# Patient Record
Sex: Female | Born: 1955 | Race: White | Hispanic: No | Marital: Married | Smoking: Former smoker
Health system: Southern US, Community
[De-identification: ages and names within clinical notes are randomized; demographics above are authoritative.]

## PROBLEM LIST (undated history)

## (undated) DIAGNOSIS — C6932 Malignant neoplasm of left choroid: Secondary | ICD-10-CM

## (undated) DIAGNOSIS — K219 Gastro-esophageal reflux disease without esophagitis: Secondary | ICD-10-CM

## (undated) DIAGNOSIS — F419 Anxiety disorder, unspecified: Secondary | ICD-10-CM

## (undated) DIAGNOSIS — L409 Psoriasis, unspecified: Secondary | ICD-10-CM

## (undated) DIAGNOSIS — Z973 Presence of spectacles and contact lenses: Secondary | ICD-10-CM

## (undated) DIAGNOSIS — C801 Malignant (primary) neoplasm, unspecified: Secondary | ICD-10-CM

## (undated) HISTORY — PX: ESOPHAGOGASTRODUODENOSCOPY: SHX1529

## (undated) HISTORY — PX: COLONOSCOPY: SHX174

## (undated) HISTORY — PX: ABDOMINAL HYSTERECTOMY: SHX81

---

## 2004-09-21 ENCOUNTER — Ambulatory Visit: Payer: Self-pay | Admitting: Orthopedic Surgery

## 2005-08-05 HISTORY — PX: BRAIN TUMOR EXCISION: SHX577

## 2005-11-26 ENCOUNTER — Ambulatory Visit: Payer: Self-pay

## 2006-02-06 ENCOUNTER — Ambulatory Visit: Payer: Self-pay | Admitting: Neurosurgery

## 2006-03-14 ENCOUNTER — Ambulatory Visit: Payer: Self-pay | Admitting: Internal Medicine

## 2006-10-03 ENCOUNTER — Ambulatory Visit: Payer: Self-pay | Admitting: Internal Medicine

## 2007-07-23 ENCOUNTER — Ambulatory Visit: Payer: Self-pay | Admitting: Rheumatology

## 2007-10-20 ENCOUNTER — Ambulatory Visit: Payer: Self-pay | Admitting: Unknown Physician Specialty

## 2007-10-22 ENCOUNTER — Ambulatory Visit: Payer: Self-pay | Admitting: Unknown Physician Specialty

## 2007-12-04 ENCOUNTER — Ambulatory Visit: Payer: Self-pay | Admitting: Internal Medicine

## 2007-12-22 ENCOUNTER — Ambulatory Visit: Payer: Self-pay | Admitting: Unknown Physician Specialty

## 2008-04-07 ENCOUNTER — Ambulatory Visit: Payer: Self-pay

## 2008-07-26 ENCOUNTER — Ambulatory Visit: Payer: Self-pay | Admitting: Internal Medicine

## 2010-08-05 HISTORY — PX: EYE SURGERY: SHX253

## 2010-12-10 ENCOUNTER — Ambulatory Visit: Payer: Self-pay | Admitting: Internal Medicine

## 2013-08-11 ENCOUNTER — Ambulatory Visit: Payer: Self-pay | Admitting: Internal Medicine

## 2013-09-24 ENCOUNTER — Ambulatory Visit: Payer: Self-pay | Admitting: Hematology and Oncology

## 2013-09-29 ENCOUNTER — Ambulatory Visit: Payer: Self-pay | Admitting: Hematology and Oncology

## 2013-10-13 ENCOUNTER — Ambulatory Visit: Payer: Self-pay

## 2013-12-08 ENCOUNTER — Ambulatory Visit: Payer: Self-pay | Admitting: Hematology and Oncology

## 2014-03-16 ENCOUNTER — Ambulatory Visit: Payer: Self-pay | Admitting: Family Medicine

## 2014-03-16 LAB — COMPREHENSIVE METABOLIC PANEL
ALK PHOS: 88 U/L
ALT: 31 U/L
ANION GAP: 8 (ref 7–16)
AST: 17 U/L (ref 15–37)
Albumin: 3.8 g/dL (ref 3.4–5.0)
BILIRUBIN TOTAL: 0.3 mg/dL (ref 0.2–1.0)
BUN: 12 mg/dL (ref 7–18)
CALCIUM: 9 mg/dL (ref 8.5–10.1)
CHLORIDE: 100 mmol/L (ref 98–107)
CREATININE: 0.85 mg/dL (ref 0.60–1.30)
Co2: 28 mmol/L (ref 21–32)
EGFR (African American): >60
GLUCOSE: 87 mg/dL (ref 65–99)
Osmolality: 271 (ref 275–301)
POTASSIUM: 3.8 mmol/L (ref 3.5–5.1)
SODIUM: 136 mmol/L (ref 136–145)
TOTAL PROTEIN: 7.2 g/dL (ref 6.4–8.2)

## 2014-03-16 LAB — CBC CANCER CENTER
BASOS ABS: 0.1 x10 3/mm (ref 0.0–0.1)
Basophil %: 1.1 %
EOS ABS: 0.1 x10 3/mm (ref 0.0–0.7)
EOS PCT: 1.7 %
HCT: 43.8 % (ref 35.0–47.0)
HGB: 14.6 g/dL (ref 12.0–16.0)
LYMPHS ABS: 2.3 x10 3/mm (ref 1.0–3.6)
Lymphocyte %: 34 %
MCH: 30.9 pg (ref 26.0–34.0)
MCHC: 33.4 g/dL (ref 32.0–36.0)
MCV: 92 fL (ref 80–100)
MONOS PCT: 9.3 %
Monocyte #: 0.6 x10 3/mm (ref 0.2–0.9)
Neutrophil #: 3.7 x10 3/mm (ref 1.4–6.5)
Neutrophil %: 53.9 %
PLATELETS: 239 x10 3/mm (ref 150–440)
RBC: 4.74 10*6/uL (ref 3.80–5.20)
RDW: 13.8 % (ref 11.5–14.5)
WBC: 6.8 x10 3/mm (ref 3.6–11.0)

## 2014-04-05 ENCOUNTER — Ambulatory Visit: Payer: Self-pay | Admitting: Family Medicine

## 2015-11-30 ENCOUNTER — Other Ambulatory Visit: Payer: Self-pay | Admitting: Internal Medicine

## 2015-11-30 DIAGNOSIS — M5440 Lumbago with sciatica, unspecified side: Secondary | ICD-10-CM

## 2015-12-18 ENCOUNTER — Ambulatory Visit: Payer: 59 | Attending: Internal Medicine

## 2016-08-05 HISTORY — PX: MOHS SURGERY: SUR867

## 2016-08-08 ENCOUNTER — Other Ambulatory Visit: Payer: Self-pay | Admitting: Orthopedic Surgery

## 2016-08-08 DIAGNOSIS — M2392 Unspecified internal derangement of left knee: Secondary | ICD-10-CM

## 2016-08-08 DIAGNOSIS — M1712 Unilateral primary osteoarthritis, left knee: Secondary | ICD-10-CM

## 2016-08-16 ENCOUNTER — Ambulatory Visit: Payer: 59

## 2017-07-24 ENCOUNTER — Emergency Department
Admission: EM | Admit: 2017-07-24 | Discharge: 2017-07-25 | Disposition: A | Discharge status: Home / Self Care | Payer: 59 | Attending: Emergency Medicine | Admitting: Emergency Medicine

## 2017-07-24 ENCOUNTER — Other Ambulatory Visit: Payer: Self-pay

## 2017-07-24 DIAGNOSIS — R197 Diarrhea, unspecified: Secondary | ICD-10-CM | POA: Insufficient documentation

## 2017-07-24 DIAGNOSIS — R112 Nausea with vomiting, unspecified: Secondary | ICD-10-CM | POA: Insufficient documentation

## 2017-07-24 LAB — COMPREHENSIVE METABOLIC PANEL
ALBUMIN: 3.3 g/dL — AB (ref 3.5–5.0)
ALT: 21 U/L (ref 14–54)
AST: 25 U/L (ref 15–41)
Alkaline Phosphatase: 59 U/L (ref 38–126)
Anion gap: 9 (ref 5–15)
BUN: 16 mg/dL (ref 6–20)
CHLORIDE: 101 mmol/L (ref 101–111)
CO2: 20 mmol/L — ABNORMAL LOW (ref 22–32)
Calcium: 8.2 mg/dL — ABNORMAL LOW (ref 8.9–10.3)
Creatinine, Ser: 0.62 mg/dL (ref 0.44–1.00)
GFR calc Af Amer: >60 mL/min (ref >60)
Glucose, Bld: 135 mg/dL — ABNORMAL HIGH (ref 65–99)
POTASSIUM: 3.6 mmol/L (ref 3.5–5.1)
Sodium: 130 mmol/L — ABNORMAL LOW (ref 135–145)
Total Bilirubin: 0.6 mg/dL (ref 0.3–1.2)
Total Protein: 6.2 g/dL — ABNORMAL LOW (ref 6.5–8.1)

## 2017-07-24 LAB — CBC
HEMATOCRIT: 41.8 % (ref 35.0–47.0)
Hemoglobin: 14.2 g/dL (ref 12.0–16.0)
MCH: 31 pg (ref 26.0–34.0)
MCHC: 33.9 g/dL (ref 32.0–36.0)
MCV: 91.4 fL (ref 80.0–100.0)
Platelets: 187 10*3/uL (ref 150–440)
RBC: 4.57 MIL/uL (ref 3.80–5.20)
RDW: 13.4 % (ref 11.5–14.5)
WBC: 7 10*3/uL (ref 3.6–11.0)

## 2017-07-24 LAB — LIPASE, BLOOD: LIPASE: 21 U/L (ref 11–51)

## 2017-07-24 MED ORDER — METOCLOPRAMIDE HCL 5 MG/ML IJ SOLN
10.0000 mg | Freq: Once | INTRAMUSCULAR | Status: AC
  Administered 2017-07-24: 10 mg via INTRAVENOUS
  Filled 2017-07-24: qty 2

## 2017-07-24 MED ORDER — SODIUM CHLORIDE 0.9 % IV BOLUS (SEPSIS)
1000.0000 mL | Freq: Once | INTRAVENOUS | Status: AC
  Administered 2017-07-24: 1000 mL via INTRAVENOUS

## 2017-07-24 MED ORDER — MORPHINE SULFATE (PF) 2 MG/ML IV SOLN
2.0000 mg | Freq: Once | INTRAVENOUS | Status: AC
  Administered 2017-07-24: 2 mg via INTRAVENOUS
  Filled 2017-07-24: qty 1

## 2017-07-24 MED ORDER — FAMOTIDINE IN NACL 20-0.9 MG/50ML-% IV SOLN
20.0000 mg | Freq: Once | INTRAVENOUS | Status: AC
  Administered 2017-07-24: 20 mg via INTRAVENOUS
  Filled 2017-07-24: qty 50

## 2017-07-24 MED ORDER — ONDANSETRON HCL 4 MG PO TABS: 4.0000 mg | ORAL_TABLET | Freq: Three times a day (TID) | ORAL | 0 refills | Status: DC | PRN

## 2017-07-24 MED ORDER — PB-HYOSCY-ATROPINE-SCOPOLAMINE 16.2 MG/5ML PO ELIX
10.0000 mL | ORAL_SOLUTION | Freq: Once | ORAL | Status: DC
  Filled 2017-07-24: qty 10

## 2017-07-24 NOTE — ED Provider Notes (Addendum)
[facility] Emergency Department Provider Note ____________________________________________   First MD Initiated Contact with Patient 07/24/17 2211     (approximate)  I have reviewed the triage vital signs and the nursing notes.   HISTORY  Chief Complaint Nausea and Emesis    HPI Cindy Morrison is a 61 y.o. female with past medical history as noted below who presents with nausea and vomiting, acute onset approximately 20 hours ago, persistent course, with most recent episode of vomiting just prior to coming to the emergency department.  Patient reports associated diarrhea.  She states that there is no blood in either the vomitus or the diarrhea.  She reports crampy upper abdominal pain.  Patient denies fever or chills.  Her husband had the same symptoms although his have improved in the last few hours.  The patient and her husband both had tacos from the same restaurant last night approximately 6 hours before the symptoms started.  No other recent illness, sick contacts, unusual foods, or any recent travel.  No recent antibiotic use.  No past medical history on file.  There are no active problems to display for this patient.    Prior to Admission medications   Medication Sig Start Date End Date Taking? Authorizing Provider  ondansetron (ZOFRAN) 4 MG tablet Take 1 tablet (4 mg total) by mouth every 8 (eight) hours as needed for up to 5 days for nausea or vomiting. 07/25/17 07/30/17  Hinda Kehr, MD    Allergies Patient has no allergy information on record.  No family history on file.  Social History Social History   Tobacco Use  . Smoking status: Not on file  Substance Use Topics  . Alcohol use: Not on file  . Drug use: Not on file    Review of Systems  Constitutional: No fever.  Eyes: No redness. ENT: No sore throat. Cardiovascular: Denies chest pain. Respiratory: Denies shortness of breath. Gastrointestinal: Positive for nausea,  vomiting, diarrhea.  Genitourinary: Negative for dysuria.  Musculoskeletal: Negative for back pain. Skin: Negative for rash. Neurological: Negative for headache.    ____________________________________________   PHYSICAL EXAM:  VITAL SIGNS: ED Triage Vitals  Enc Vitals Group     BP 07/24/17 2150 (!) 118/57     Pulse Rate 07/24/17 2150 (!) 101     Resp 07/24/17 2150 (!) 21     Temp 07/24/17 2150 98.5 F (36.9 C)     Temp Source 07/24/17 2150 Oral     SpO2 07/24/17 2150 90 %     Weight 07/24/17 2154 160 lb (72.6 kg)     Height 07/24/17 2154 5\' 6"  (1.676 m)     Head Circumference --      Peak Flow --      Pain Score 07/24/17 2150 10     Pain Loc --      Pain Edu? --      Excl. in [place]? --     Constitutional: Alert and oriented.  Slightly uncomfortable appearing but in no acute distress. Eyes: Conjunctivae are normal.  No scleral icterus Head: Atraumatic. Nose: No congestion/rhinnorhea. Mouth/Throat: Mucous membranes are dry.   Neck: Normal range of motion.  Cardiovascular: Normal rate, regular rhythm. Grossly normal heart sounds.  Good peripheral circulation. Respiratory: Normal respiratory effort.  No retractions. Lungs CTAB. Gastrointestinal: Soft with mild epigastric tenderness, and mild discomfort to bilateral lower quadrants. No distention.  Genitourinary: No flank tenderness. Musculoskeletal: No lower extremity edema.  Extremities warm and well perfused.  Neurologic:  Normal speech and language. No gross focal neurologic deficits are appreciated.  Skin:  Skin is warm and dry. No rash noted. Psychiatric: Mood and affect are normal. Speech and behavior are normal.  ____________________________________________   LABS (all labs ordered are listed, but only abnormal results are displayed)  Labs Reviewed  COMPREHENSIVE METABOLIC PANEL - Abnormal; Notable for the following components:      Result Value   Sodium 130 (*)    CO2 20 (*)    Glucose, Bld 135 (*)     Calcium 8.2 (*)    Total Protein 6.2 (*)    Albumin 3.3 (*)    All other components within normal limits  URINALYSIS, COMPLETE (UACMP) WITH MICROSCOPIC - Abnormal; Notable for the following components:   Color, Urine STRAW (*)    APPearance CLEAR (*)    Hgb urine dipstick SMALL (*)    Squamous Epithelial / LPF 0-5 (*)    All other components within normal limits  LIPASE, BLOOD  CBC   ____________________________________________  EKG  ED ECG REPORT I, Arta Silence, the attending physician, personally viewed and interpreted this ECG.  Date: 07/24/2017 EKG Time: 2152 Rate: 99 Rhythm: normal sinus rhythm QRS Axis: normal Intervals: normal ST/T Wave abnormalities: normal Narrative Interpretation: no evidence of acute ischemia; no prior EKG available for comparison  ____________________________________________  RADIOLOGY    ____________________________________________   PROCEDURES  Procedure(s) performed: No    Critical Care performed: No ____________________________________________   INITIAL IMPRESSION / ASSESSMENT AND PLAN / ED COURSE  Pertinent labs & imaging results that were available during my care of the patient were reviewed by me and considered in my medical decision making (see chart for details).  61 year old female with past medical history as noted above presents with nausea, vomiting, diarrhea over the last day, with crampy abdominal pain.  Symptoms began after she ate tacos from a [employer], and her husband, who ate the same food, had identical symptoms until the last few hours.  No relevant records are available on review of past medical records in [place].  On exam, patient is relatively well-appearing, vital signs are normal except for borderline tachycardia, the abdomen is soft with minimal bilateral lower quadrant discomfort and mild upper abdominal tenderness, and the mucous members are somewhat dry.  Presentation is consistent with  acute gastroenteritis versus foodborne illness.  There is no evidence of hepatobiliary cause, colitis, diverticulitis, or other intra-abdominal cause given the lack of focal tenderness, as well as the fact that the husband had the same symptoms and they were associated with food.  Plan for symptomatic treatment, IV fluids, labs and reassess.  Clinical Course as of Jul 25 2237  Thu Jul 24, 2017  2338 Assuming care from Dr. Cherylann Banas.  In short, Cindy Morrison is a 61 y.o. female with a chief complaint of N/V and abdominal pain.  Refer to the original H&P for additional details.  The current plan of care is to reassess and discharge when/if appropriate.   [CF]  Fri Jul 25, 2017  0024 Reassessed the patient who is sleeping comfortably.  She woke up and smiled and said that she feels great.  She has finished her second liter and would like to go home and get some rest.  I reviewed her labs which are all reassuring.  Her urinalysis is still pending, but as long as the results are normal she will be discharged with Dr. Kennith Gain return precautions and recommendations.  [CF]  [phone]  No evidence of infection on UA.  Will discharge as planned. WBC, UA: 0-5 [CF]    Clinical Course User Index [CF] Hinda Kehr, MD   ----------------------------------------- 11:26 PM on 07/24/2017 -----------------------------------------  The patient reported persistent pain after Pepcid and Donnatal is not available, so I ordered a low dose of morphine.  We will give a second liter of fluids.  Patient's labs are unremarkable.  I am signing the patient out to the oncoming physician Dr. Karma Greaser, who will reassess the patient and discharge home if symptoms improve and she is tolerating p.o.  ____________________________________________   FINAL CLINICAL IMPRESSION(S) / ED DIAGNOSES  Final diagnoses:  Nausea vomiting and diarrhea      NEW MEDICATIONS STARTED DURING THIS VISIT:  This SmartLink is deprecated. Use  AVSMEDLIST instead to display the medication list for a patient.   Note:  This document was prepared using Dragon voice recognition software and may include unintentional dictation errors.    Arta Silence, MD 07/24/17 4098    Arta Silence, MD 07/24/17 Arnoldo Lenis    Arta Silence, MD 07/25/17 2238

## 2017-07-24 NOTE — ED Provider Notes (Signed)
No results found.   Labs Reviewed  COMPREHENSIVE METABOLIC PANEL - Abnormal; Notable for the following components:      Result Value   Sodium 130 (*)    CO2 20 (*)    Glucose, Bld 135 (*)    Calcium 8.2 (*)    Total Protein 6.2 (*)    Albumin 3.3 (*)    All other components within normal limits  URINALYSIS, COMPLETE (UACMP) WITH MICROSCOPIC - Abnormal; Notable for the following components:   Color, Urine STRAW (*)    APPearance CLEAR (*)    Hgb urine dipstick SMALL (*)    Squamous Epithelial / LPF 0-5 (*)    All other components within normal limits  LIPASE, BLOOD  CBC     Clinical Course as of Jul 26 51  Thu Jul 24, 2017  2338 Assuming care from Dr. Cherylann Banas.  In short, Cindy Morrison is a 61 y.o. female with a chief complaint of N/V and abdominal pain.  Refer to the original H&P for additional details.  The current plan of care is to reassess and discharge when/if appropriate.   [CF]  Fri Jul 25, 2017  0024 Reassessed the patient who is sleeping comfortably.  She woke up and smiled and said that she feels great.  She has finished her second liter and would like to go home and get some rest.  I reviewed her labs which are all reassuring.  Her urinalysis is still pending, but as long as the results are normal she will be discharged with Dr. Kennith Gain return precautions and recommendations.  [CF]  0051 No evidence of infection on UA.  Will discharge as planned. WBC, UA: 0-5 [CF]    Clinical Course User Index [CF] Hinda Kehr, MD     Final diagnoses:  Nausea vomiting and diarrhea      Hinda Kehr, MD 07/25/17 [phone]

## 2017-07-24 NOTE — Discharge Instructions (Addendum)
Return to the emergency department for new, worsening, persistent vomiting, abdominal pain, fever or chills, inability to tolerate anything by mouth, blood in your stool or in your vomit, or any other new or worsening symptoms that concern you.

## 2017-07-24 NOTE — ED Triage Notes (Signed)
Pt arrived via EMS from home d/t nausea and vomiting since 3am and severe diarrhea. this morning. Pt reports her and her husband ate tacos last night and both became sick around 3am. Pt reports her husband is feeling better but her symptoms have not stopped. Pt reports having around 10 episodes of vomiting within the last 24 hours. Pt is A&O x4 at this time.

## 2017-07-25 LAB — URINALYSIS, COMPLETE (UACMP) WITH MICROSCOPIC
Bacteria, UA: NONE SEEN
Bilirubin Urine: NEGATIVE
Glucose, UA: NEGATIVE mg/dL
KETONES UR: NEGATIVE mg/dL
LEUKOCYTES UA: NEGATIVE
Nitrite: NEGATIVE
PROTEIN: NEGATIVE mg/dL
Specific Gravity, Urine: 1.008 (ref 1.005–1.030)
pH: 6 (ref 5.0–8.0)

## 2017-07-25 MED ORDER — ONDANSETRON HCL 4 MG PO TABS: 4.0000 mg | ORAL_TABLET | Freq: Three times a day (TID) | ORAL | 0 refills | Status: AC | PRN

## 2017-09-01 ENCOUNTER — Emergency Department
Admission: EM | Admit: 2017-09-01 | Discharge: 2017-09-02 | Disposition: A | Discharge status: Home / Self Care | Payer: Managed Care, Other (non HMO) | Attending: Emergency Medicine | Admitting: Emergency Medicine

## 2017-09-01 ENCOUNTER — Emergency Department: Payer: Managed Care, Other (non HMO)

## 2017-09-01 DIAGNOSIS — F17200 Nicotine dependence, unspecified, uncomplicated: Secondary | ICD-10-CM | POA: Insufficient documentation

## 2017-09-01 DIAGNOSIS — R197 Diarrhea, unspecified: Secondary | ICD-10-CM | POA: Diagnosis not present

## 2017-09-01 DIAGNOSIS — K529 Noninfective gastroenteritis and colitis, unspecified: Secondary | ICD-10-CM | POA: Insufficient documentation

## 2017-09-01 DIAGNOSIS — E860 Dehydration: Secondary | ICD-10-CM

## 2017-09-01 DIAGNOSIS — R1030 Lower abdominal pain, unspecified: Secondary | ICD-10-CM | POA: Diagnosis not present

## 2017-09-01 DIAGNOSIS — Z7982 Long term (current) use of aspirin: Secondary | ICD-10-CM | POA: Insufficient documentation

## 2017-09-01 DIAGNOSIS — Z85840 Personal history of malignant neoplasm of eye: Secondary | ICD-10-CM | POA: Diagnosis not present

## 2017-09-01 DIAGNOSIS — Z79899 Other long term (current) drug therapy: Secondary | ICD-10-CM | POA: Diagnosis not present

## 2017-09-01 DIAGNOSIS — R112 Nausea with vomiting, unspecified: Secondary | ICD-10-CM | POA: Insufficient documentation

## 2017-09-01 DIAGNOSIS — R000 Tachycardia, unspecified: Secondary | ICD-10-CM | POA: Insufficient documentation

## 2017-09-01 HISTORY — DX: Malignant (primary) neoplasm, unspecified: C80.1

## 2017-09-01 LAB — CBC
HCT: 41.8 % (ref 35.0–47.0)
Hemoglobin: 14.6 g/dL (ref 12.0–16.0)
MCH: 31.7 pg (ref 26.0–34.0)
MCHC: 34.9 g/dL (ref 32.0–36.0)
MCV: 90.7 fL (ref 80.0–100.0)
PLATELETS: 198 10*3/uL (ref 150–440)
RBC: 4.61 MIL/uL (ref 3.80–5.20)
RDW: 13.8 % (ref 11.5–14.5)
WBC: 8.1 10*3/uL (ref 3.6–11.0)

## 2017-09-01 LAB — COMPREHENSIVE METABOLIC PANEL
ALK PHOS: 65 U/L (ref 38–126)
ALT: 24 U/L (ref 14–54)
AST: 29 U/L (ref 15–41)
Albumin: 3.9 g/dL (ref 3.5–5.0)
Anion gap: 10 (ref 5–15)
BILIRUBIN TOTAL: 0.7 mg/dL (ref 0.3–1.2)
BUN: 14 mg/dL (ref 6–20)
CALCIUM: 8.9 mg/dL (ref 8.9–10.3)
CO2: 21 mmol/L — AB (ref 22–32)
CREATININE: 0.67 mg/dL (ref 0.44–1.00)
Chloride: 102 mmol/L (ref 101–111)
Glucose, Bld: 158 mg/dL — ABNORMAL HIGH (ref 65–99)
Potassium: 3.7 mmol/L (ref 3.5–5.1)
Sodium: 133 mmol/L — ABNORMAL LOW (ref 135–145)
Total Protein: 7.2 g/dL (ref 6.5–8.1)

## 2017-09-01 LAB — URINALYSIS, COMPLETE (UACMP) WITH MICROSCOPIC
Bacteria, UA: NONE SEEN
Bilirubin Urine: NEGATIVE
GLUCOSE, UA: NEGATIVE mg/dL
Hgb urine dipstick: NEGATIVE
KETONES UR: 5 mg/dL — AB
Leukocytes, UA: NEGATIVE
Nitrite: NEGATIVE
PH: 6 (ref 5.0–8.0)
Protein, ur: NEGATIVE mg/dL
Specific Gravity, Urine: 1.017 (ref 1.005–1.030)

## 2017-09-01 LAB — LIPASE, BLOOD: Lipase: 17 U/L (ref 11–51)

## 2017-09-01 MED ORDER — SODIUM CHLORIDE 0.9 % IV BOLUS (SEPSIS)
1000.0000 mL | Freq: Once | INTRAVENOUS | Status: AC
  Administered 2017-09-01: 1000 mL via INTRAVENOUS

## 2017-09-01 MED ORDER — ONDANSETRON HCL 4 MG/2ML IJ SOLN
4.0000 mg | Freq: Once | INTRAMUSCULAR | Status: AC
  Administered 2017-09-01: 4 mg via INTRAVENOUS

## 2017-09-01 MED ORDER — ONDANSETRON HCL 4 MG/2ML IJ SOLN
INTRAMUSCULAR | Status: AC
  Filled 2017-09-01: qty 2

## 2017-09-01 MED ORDER — IOPAMIDOL (ISOVUE-300) INJECTION 61%
100.0000 mL | Freq: Once | INTRAVENOUS | Status: AC | PRN
  Administered 2017-09-01: 100 mL via INTRAVENOUS
  Filled 2017-09-01: qty 100

## 2017-09-01 MED ORDER — DEXTROSE 5 % IV BOLUS
500.0000 mL | Freq: Once | INTRAVENOUS | Status: AC
  Administered 2017-09-02: 500 mL via INTRAVENOUS
  Filled 2017-09-01: qty 500

## 2017-09-01 MED ORDER — PROMETHAZINE HCL 25 MG/ML IJ SOLN
25.0000 mg | Freq: Once | INTRAMUSCULAR | Status: AC
  Administered 2017-09-02: 25 mg via INTRAVENOUS
  Filled 2017-09-01: qty 1

## 2017-09-01 MED ORDER — ONDANSETRON 4 MG PO TBDP
ORAL_TABLET | ORAL | Status: AC
  Filled 2017-09-01: qty 1

## 2017-09-01 MED ORDER — ONDANSETRON 4 MG PO TBDP
4.0000 mg | ORAL_TABLET | Freq: Once | ORAL | Status: AC | PRN
  Administered 2017-09-01: 4 mg via ORAL

## 2017-09-01 NOTE — ED Provider Notes (Signed)
[facility] Emergency Department Provider Note  ____________________________________________  Time seen: Approximately 10:35 PM  I have reviewed the triage vital signs and the nursing notes.   HISTORY  Chief Complaint Emesis and Diarrhea    HPI Cindy Morrison is a 62 y.o. female who complains of diffuse lower abdominal pain with vomiting and diarrhea that started last night. Constant, waxing and waning, nonradiating, no aggravating or alleviating factors. Rated as severe. She is able to tolerate water but notes that it precipitates more diarrhea. Not able to tolerate solid foods at this time.     Past Medical History:  Diagnosis Date  . 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    Past Surgical History:  Procedure Laterality Date  . ABDOMINAL HYSTERECTOMY    . BRAIN TUMOR EXCISION    . EYE SURGERY       Prior to Admission medications   Medication Sig Start Date End Date Taking? Authorizing Provider  famotidine (PEPCID) 20 MG tablet Take 1 tablet (20 mg total) by mouth 2 (two) times daily. 09/02/17   Carrie Mew, MD  naproxen (NAPROSYN) 500 MG tablet Take 1 tablet (500 mg total) by mouth 2 (two) times daily with a meal. 09/02/17   Carrie Mew, MD  promethazine (PHENERGAN) 25 MG tablet Take 1 tablet (25 mg total) by mouth every 6 (six) hours as needed for nausea or vomiting. 09/02/17   Carrie Mew, MD   cholecalciferol (CHOLECALCIFEROL) 1,000 unit tablet  Take 1,000 Units by mouth once daily.  0   Active  cyanocobalamin (VITAMIN B12) 1000 MCG tablet  Take 1,000 mcg by mouth once daily.  0   Active  omega-3 fatty acids/fish oil 340-1,000 mg capsule  Take 1 capsule by mouth 2 (two) times daily.  0   Active  betamethasone dipropionate (DIPROSONE) 0.05 % ointment  Apply topically 2 (two) times daily. 45 g  5 12/02/2016  Active  turmeric root extract 500 mg Cap  Take 1 tablet by  mouth once daily.  0   Active  calcium carbonate 1250 MG capsule  Take 1,250 mg by mouth 2 (two) times daily with meals.  0   Active  etodolac (LODINE) 400 MG tablet  TAKE 1 TABLET BY MOUTH TWO TIMES DAILY 180 tablet  3 04/21/2017  Active  LORazepam (ATIVAN) 0.5 MG tablet  Indications: Situational depression Take 1 tablet (0.5 mg total) by mouth every 8 (eight) hours as needed for Anxiety 90 tablet  0 06/03/2017  Active  aspirin 81 MG EC tablet  Take 81 mg by mouth once daily  0   Active  cinnamon bark 500 mg capsule  Take 1,000 mg by mouth once daily  0   Active  omeprazole (PRILOSEC) 40 MG DR capsule  TAKE 1 CAPSULE BY MOUTH ONCE A DAY 90 capsule  0 07/08/2017  Active  venlafaxine (EFFEXOR-XR) 150 MG XR capsule  TAKE 1 CAPSULE BY MOUTH ONCE DAILY 90 capsule  0 07/31/2017  Active  zolpidem (AMBIEN) 5 MG tablet  Take 1 tablet (5 mg total) by mouth nightly as needed for Sleep 30 tablet  3 08/06/2017  Active      Allergies Patient has no known allergies.   No family history on file.  Social History Social History   Tobacco Use  . Smoking status: Current Every Day Smoker  . Smokeless tobacco: Never Used  Substance Use Topics  .  Alcohol use: No    Frequency: Never  . Drug use: Not on file    Review of Systems  Constitutional:   No fever or chills.  ENT:   Positive recent rhinorrhea. No sore throat or cough. Cardiovascular:   No chest pain or syncope. Respiratory:   No dyspnea or cough. Gastrointestinal:  Positive for generalized abdominal pain as above with vomiting and diarrhea.  Musculoskeletal:   Negative for focal pain or swelling. Positive body aches. No joint pains. All other systems reviewed and are negative except as documented above in ROS and HPI.  ____________________________________________   PHYSICAL EXAM:  VITAL SIGNS: ED Triage Vitals  Enc Vitals Group     BP 09/01/17 1955 (!) 118/58     Pulse Rate 09/01/17 1955 (!) 104      Resp 09/01/17 1955 (!) 22     Temp 09/01/17 1955 98.8 F (37.1 C)     Temp Source 09/01/17 1955 Oral     SpO2 09/01/17 1955 97 %     Weight 09/01/17 1956 163 lb (73.9 kg)     Height 09/01/17 1956 5\' 6"  (1.676 m)     Head Circumference --      Peak Flow --      Pain Score 09/01/17 2000 8     Pain Loc --      Pain Edu? --      Excl. in [place]? --     Vital signs reviewed, nursing assessments reviewed.   Constitutional:   Alert and oriented. Uncomfortable, but not in distress. Eyes:   No scleral icterus.  EOMI. No nystagmus. No conjunctival pallor. PERRL. ENT   Head:   Normocephalic and atraumatic.   Nose:   No congestion/rhinnorhea.    Mouth/Throat:   MMM, no pharyngeal erythema. No peritonsillar mass.    Neck:   No meningismus. Full ROM. Hematological/Lymphatic/Immunilogical:   No cervical lymphadenopathy. Cardiovascular:   Tachycardia heart rate 105. Symmetric bilateral radial and DP pulses.  No murmurs.  Respiratory:   Normal respiratory effort without tachypnea/retractions. Breath sounds are clear and equal bilaterally. No wheezes/rales/rhonchi. Gastrointestinal:   Soft with generalized tenderness, nonfocal. Non distended. There is no CVA tenderness.  No rebound, rigidity, or guarding. Genitourinary:   deferred Musculoskeletal:   Normal range of motion in all extremities. No joint effusions.  No lower extremity tenderness.  No edema. Neurologic:   Normal speech and language.  Motor grossly intact. No acute focal neurologic deficits are appreciated.  Skin:    Skin is warm, dry and intact. No rash noted.  No petechiae, purpura, or bullae.  ____________________________________________    LABS (pertinent positives/negatives) (all labs ordered are listed, but only abnormal results are displayed) Labs Reviewed  COMPREHENSIVE METABOLIC PANEL - Abnormal; Notable for the following components:      Result Value   Sodium 133 (*)    CO2 21 (*)    Glucose, Bld 158 (*)     All other components within normal limits  URINALYSIS, COMPLETE (UACMP) WITH MICROSCOPIC - Abnormal; Notable for the following components:   Color, Urine YELLOW (*)    APPearance HAZY (*)    Ketones, ur 5 (*)    Squamous Epithelial / LPF 0-5 (*)    All other components within normal limits  LIPASE, BLOOD  CBC   ____________________________________________   EKG    ____________________________________________    RADIOLOGY  Ct Abdomen Pelvis W Contrast  Result Date: 09/01/2017 CLINICAL DATA:  Lower abdominal pain, vomiting EXAM: CT  ABDOMEN AND PELVIS WITH CONTRAST TECHNIQUE: Multidetector CT imaging of the abdomen and pelvis was performed using the standard protocol following bolus administration of intravenous contrast. CONTRAST:  15mL ISOVUE-300 IOPAMIDOL (ISOVUE-300) INJECTION 61% COMPARISON:  08/11/2013 CT.  MRI 12/08/2013. FINDINGS: Lower chest: Bibasilar scarring or atelectasis. Heart is normal size. No effusions. Hepatobiliary: Hemangiomas again seen within the liver as shown on prior MRI. The gallbladder unremarkable. Pancreas: No focal abnormality or ductal dilatation. Spleen: No focal abnormality.  Normal size. Adrenals/Urinary Tract: Left adrenal mass again noted measuring up to 3 cm, unchanged shown to be most consistent with adenoma on prior MRI. Exophytic cyst off the posterior aspect of the midpole of the right kidney, stable. No hydronephrosis. Urinary bladder unremarkable. Stomach/Bowel: Appendix is normal. Stomach, large and small bowel grossly unremarkable. Vascular/Lymphatic: Aortic atherosclerosis. No enlarged abdominal or pelvic lymph nodes. Reproductive: Prior hysterectomy.  No adnexal masses. Other: No free fluid or free air. Musculoskeletal: No acute bony abnormality. IMPRESSION: Stable hemangiomas within the liver. Stable left adrenal adenoma. No acute findings in the abdomen or pelvis. Aortoiliac atherosclerosis. Electronically Signed   By: Rolm Baptise M.D.   On:  09/01/2017 23:14    ____________________________________________   PROCEDURES Procedures  ____________________________________________    CLINICAL IMPRESSION / ASSESSMENT AND PLAN / ED COURSE  Pertinent labs & imaging results that were available during my care of the patient were reviewed by me and considered in my medical decision making (see chart for details).   Who is not in distress. Presents with generalized abdominal pain, generalized tenderness on exam. She has a little bit tachycardic and appears uncomfortable. Due to her being a somewhat poor historian and advanced age presenting an increased risk of occult intra-abdominal pathology and exam concerning for possible perforation or obstruction versus colitis or intra-abdominal abscess, although the CT scan to evaluate. Labs are unremarkable at this time. We'll treat symptomatically with Zofran, IV fluids for hydration, analgesics. Patient is not septic.  Clinical Course as of Sep 02 12  Mon Sep 01, 2017  2340 CT abdomen and pelvis negative. No evidence of small bowel obstruction perforation or colitis.  [PS]  Tue Sep 02, 2017  0010 On reassessment patient reports still being nauseated but once to drink water. Heart rate is improved to 80. Workup is entirely reassuring. Plan for outpatient follow-up. Likely norovirus or other viral gastroenteritis.  [PS]    Clinical Course User Index [PS] Carrie Mew, MD     ____________________________________________   FINAL CLINICAL IMPRESSION(S) / ED DIAGNOSES    Final diagnoses:  Nausea vomiting and diarrhea  Dehydration  Gastroenteritis       Portions of this note were generated with dragon dictation software. Dictation errors may occur despite best attempts at proofreading.    Carrie Mew, MD 09/02/17 [phone]

## 2017-09-01 NOTE — ED Triage Notes (Signed)
Patient c/o emesis, diarrhea and lower abdominal pain.

## 2017-09-02 MED ORDER — NAPROXEN 500 MG PO TABS: 500.0000 mg | ORAL_TABLET | Freq: Two times a day (BID) | ORAL | 0 refills | Status: DC

## 2017-09-02 MED ORDER — FAMOTIDINE 20 MG PO TABS: 20.0000 mg | ORAL_TABLET | Freq: Two times a day (BID) | ORAL | 0 refills | Status: DC

## 2017-09-02 MED ORDER — PROMETHAZINE HCL 25 MG PO TABS: 25.0000 mg | ORAL_TABLET | Freq: Four times a day (QID) | ORAL | 0 refills | Status: DC | PRN

## 2017-09-02 NOTE — ED Notes (Signed)
Patient discharged to home per MD order. Patient in stable condition, and deemed medically cleared by ED provider for discharge. Discharge instructions reviewed with patient/family using "Teach Back"; verbalized understanding of medication education and administration, and information about follow-up care. Denies further concerns. ° °

## 2017-09-02 NOTE — ED Notes (Signed)
Pt tolerated ice chips well.  Pt verbalizes feeling better.  Pt getting the rest of D5 bag and then to be discharged home with husband.

## 2017-11-26 ENCOUNTER — Other Ambulatory Visit: Payer: Self-pay | Admitting: Sports Medicine

## 2017-11-26 DIAGNOSIS — G8929 Other chronic pain: Secondary | ICD-10-CM

## 2017-11-26 DIAGNOSIS — M25511 Pain in right shoulder: Principal | ICD-10-CM

## 2017-12-03 ENCOUNTER — Ambulatory Visit
Admission: RE | Admit: 2017-12-03 | Discharge: 2017-12-03 | Disposition: A | Discharge status: Home / Self Care | Payer: Managed Care, Other (non HMO) | Source: Ambulatory Visit | Attending: Sports Medicine | Admitting: Sports Medicine

## 2017-12-03 DIAGNOSIS — G8929 Other chronic pain: Secondary | ICD-10-CM | POA: Insufficient documentation

## 2017-12-03 DIAGNOSIS — M7591 Shoulder lesion, unspecified, right shoulder: Secondary | ICD-10-CM | POA: Insufficient documentation

## 2017-12-03 DIAGNOSIS — M7541 Impingement syndrome of right shoulder: Secondary | ICD-10-CM | POA: Diagnosis not present

## 2017-12-03 DIAGNOSIS — M25511 Pain in right shoulder: Secondary | ICD-10-CM | POA: Diagnosis not present

## 2017-12-03 DIAGNOSIS — M75101 Unspecified rotator cuff tear or rupture of right shoulder, not specified as traumatic: Secondary | ICD-10-CM | POA: Diagnosis not present

## 2017-12-12 ENCOUNTER — Inpatient Hospital Stay: Admission: RE | Admit: 2017-12-12 | Payer: Managed Care, Other (non HMO) | Source: Ambulatory Visit

## 2017-12-16 ENCOUNTER — Encounter
Admission: RE | Admit: 2017-12-16 | Discharge: 2017-12-16 | Disposition: A | Discharge status: Home / Self Care | Payer: Managed Care, Other (non HMO) | Source: Ambulatory Visit | Attending: Orthopedic Surgery | Admitting: Orthopedic Surgery

## 2017-12-16 ENCOUNTER — Other Ambulatory Visit: Payer: Self-pay

## 2017-12-16 DIAGNOSIS — Y9389 Activity, other specified: Secondary | ICD-10-CM | POA: Diagnosis not present

## 2017-12-16 DIAGNOSIS — S46011A Strain of muscle(s) and tendon(s) of the rotator cuff of right shoulder, initial encounter: Secondary | ICD-10-CM | POA: Diagnosis not present

## 2017-12-16 DIAGNOSIS — Y92096 Garden or yard of other non-institutional residence as the place of occurrence of the external cause: Secondary | ICD-10-CM | POA: Diagnosis not present

## 2017-12-16 DIAGNOSIS — Z818 Family history of other mental and behavioral disorders: Secondary | ICD-10-CM | POA: Diagnosis not present

## 2017-12-16 DIAGNOSIS — Z79899 Other long term (current) drug therapy: Secondary | ICD-10-CM | POA: Diagnosis not present

## 2017-12-16 DIAGNOSIS — M7541 Impingement syndrome of right shoulder: Secondary | ICD-10-CM | POA: Diagnosis not present

## 2017-12-16 DIAGNOSIS — Z825 Family history of asthma and other chronic lower respiratory diseases: Secondary | ICD-10-CM | POA: Diagnosis not present

## 2017-12-16 DIAGNOSIS — X500XXA Overexertion from strenuous movement or load, initial encounter: Secondary | ICD-10-CM | POA: Diagnosis not present

## 2017-12-16 DIAGNOSIS — M19011 Primary osteoarthritis, right shoulder: Secondary | ICD-10-CM | POA: Diagnosis not present

## 2017-12-16 DIAGNOSIS — K219 Gastro-esophageal reflux disease without esophagitis: Secondary | ICD-10-CM | POA: Diagnosis not present

## 2017-12-16 DIAGNOSIS — Z85820 Personal history of malignant melanoma of skin: Secondary | ICD-10-CM | POA: Diagnosis not present

## 2017-12-16 DIAGNOSIS — M7581 Other shoulder lesions, right shoulder: Secondary | ICD-10-CM | POA: Diagnosis not present

## 2017-12-16 DIAGNOSIS — F17210 Nicotine dependence, cigarettes, uncomplicated: Secondary | ICD-10-CM | POA: Diagnosis not present

## 2017-12-16 DIAGNOSIS — M1990 Unspecified osteoarthritis, unspecified site: Secondary | ICD-10-CM | POA: Diagnosis not present

## 2017-12-16 DIAGNOSIS — D649 Anemia, unspecified: Secondary | ICD-10-CM | POA: Diagnosis not present

## 2017-12-16 DIAGNOSIS — M75101 Unspecified rotator cuff tear or rupture of right shoulder, not specified as traumatic: Secondary | ICD-10-CM | POA: Diagnosis present

## 2017-12-16 DIAGNOSIS — Z888 Allergy status to other drugs, medicaments and biological substances status: Secondary | ICD-10-CM | POA: Diagnosis not present

## 2017-12-16 DIAGNOSIS — Z90710 Acquired absence of both cervix and uterus: Secondary | ICD-10-CM | POA: Diagnosis not present

## 2017-12-16 DIAGNOSIS — L309 Dermatitis, unspecified: Secondary | ICD-10-CM | POA: Diagnosis not present

## 2017-12-16 DIAGNOSIS — Z7982 Long term (current) use of aspirin: Secondary | ICD-10-CM | POA: Diagnosis not present

## 2017-12-16 DIAGNOSIS — L409 Psoriasis, unspecified: Secondary | ICD-10-CM | POA: Diagnosis not present

## 2017-12-16 DIAGNOSIS — Z881 Allergy status to other antibiotic agents status: Secondary | ICD-10-CM | POA: Diagnosis not present

## 2017-12-16 DIAGNOSIS — Z8249 Family history of ischemic heart disease and other diseases of the circulatory system: Secondary | ICD-10-CM | POA: Diagnosis not present

## 2017-12-16 DIAGNOSIS — Z803 Family history of malignant neoplasm of breast: Secondary | ICD-10-CM | POA: Diagnosis not present

## 2017-12-16 DIAGNOSIS — Z809 Family history of malignant neoplasm, unspecified: Secondary | ICD-10-CM | POA: Diagnosis not present

## 2017-12-16 DIAGNOSIS — Z801 Family history of malignant neoplasm of trachea, bronchus and lung: Secondary | ICD-10-CM | POA: Diagnosis not present

## 2017-12-16 DIAGNOSIS — X509XXA Other and unspecified overexertion or strenuous movements or postures, initial encounter: Secondary | ICD-10-CM | POA: Diagnosis not present

## 2017-12-16 HISTORY — DX: Gastro-esophageal reflux disease without esophagitis: K21.9

## 2017-12-16 NOTE — Patient Instructions (Addendum)
  Your procedure is scheduled on: Thursday Dec 18, 2017 Report to Same Day Surgery 2nd floor medical mall ([place] Entrance-take elevator on left to 2nd floor.  Check in with surgery information desk.) To find out your arrival time please call [phone] between 1PM - 3PM on Wednesday Dec 17, 2017  Remember: Instructions that are not followed completely may result in serious medical risk, up to and including death, or upon the discretion of your surgeon and anesthesiologist your surgery may need to be rescheduled.    _x___ 1. Do not eat food after midnight the night before your procedure. You may drink clear liquids up to 2 hours before you are scheduled to arrive at the hospital for your procedure.  Do not drink clear liquids within 2 hours of your scheduled arrival to the hospital.  Clear liquids include  --Water or Apple juice without pulp  --Clear carbohydrate beverage such as Gatorade  --Black Coffee or Clear Tea (No milk, no creamers, do not add anything to the coffee or tea   No gum chewing or hard candies.      __x__ 2. No Alcohol for 24 hours before or after surgery.   __x__3. No Smoking or e-cigarettes for 24 prior to surgery.  Do not use any chewable tobacco products for at least 6 hour prior to surgery   ____  4. Bring all medications with you on the day of surgery if instructed.    __x__ 5. Notify your doctor if there is any change in your medical condition     (cold, fever, infections).   __x__6. On the morning of surgery brush your teeth with toothpaste and water.  You may rinse your mouth with mouth wash if you wish.  Do not swallow any toothpaste or mouthwash.   Do not wear jewelry, make-up, hairpins, clips or nail polish.  Do not wear lotions, powders, deodorant, or perfumes.   Do not shave 48 hours prior to surgery.   Do not bring valuables to the hospital.    [facility] is not responsible for any belongings or valuables.               Contacts, dentures  or bridgework may not be worn into surgery.  Leave your suitcase in the car. After surgery it may be brought to your room.  For patients admitted to the hospital, discharge time is determined by your treatment team.  Please read over the following fact sheets that you were given:   [facility] Preparing for Surgery and or MRSA Information   _x___ Take anti-hypertensive listed below, cardiac, seizure, asthma, anti-reflux and psychiatric medicines. These include:  1. Omeprazole/Prilosec  _x___ Use CHG Soap or sage wipes as directed on instruction sheet   _x___ Follow recommendations from Cardiologist, Pulmonologist or PCP regarding stopping Aspirin, Coumadin, Plavix ,Eliquis, Effient, or Pradaxa, and Pletal.  _x___Stop Anti-inflammatories such as Etodolac, Advil, Aleve, Ibuprofen, Motrin, Naproxen, Naprosyn, Goodies powders or aspirin products. OK to take Tylenol and Celebrex.   _x___ Stop supplements (Turmeric, Omega-3/Fish Oil) until after surgery.  You may continue Vitamin D, Vitamin B, and multivitamin.

## 2017-12-18 ENCOUNTER — Encounter: Payer: Self-pay | Admitting: *Deleted

## 2017-12-18 ENCOUNTER — Ambulatory Visit: Payer: Managed Care, Other (non HMO) | Admitting: Anesthesiology

## 2017-12-18 ENCOUNTER — Encounter
Admission: RE | Disposition: A | Discharge status: Home / Self Care | Payer: Self-pay | Source: Ambulatory Visit | Attending: Orthopedic Surgery

## 2017-12-18 ENCOUNTER — Other Ambulatory Visit: Payer: Self-pay

## 2017-12-18 ENCOUNTER — Ambulatory Visit
Admission: RE | Admit: 2017-12-18 | Discharge: 2017-12-18 | Disposition: A | Discharge status: Home / Self Care | Payer: Managed Care, Other (non HMO) | Source: Ambulatory Visit | Attending: Orthopedic Surgery | Admitting: Orthopedic Surgery

## 2017-12-18 DIAGNOSIS — M19011 Primary osteoarthritis, right shoulder: Secondary | ICD-10-CM | POA: Insufficient documentation

## 2017-12-18 DIAGNOSIS — Z8249 Family history of ischemic heart disease and other diseases of the circulatory system: Secondary | ICD-10-CM | POA: Insufficient documentation

## 2017-12-18 DIAGNOSIS — Z825 Family history of asthma and other chronic lower respiratory diseases: Secondary | ICD-10-CM | POA: Insufficient documentation

## 2017-12-18 DIAGNOSIS — Z803 Family history of malignant neoplasm of breast: Secondary | ICD-10-CM | POA: Insufficient documentation

## 2017-12-18 DIAGNOSIS — X500XXA Overexertion from strenuous movement or load, initial encounter: Secondary | ICD-10-CM | POA: Insufficient documentation

## 2017-12-18 DIAGNOSIS — Z809 Family history of malignant neoplasm, unspecified: Secondary | ICD-10-CM | POA: Insufficient documentation

## 2017-12-18 DIAGNOSIS — L409 Psoriasis, unspecified: Secondary | ICD-10-CM | POA: Insufficient documentation

## 2017-12-18 DIAGNOSIS — Z818 Family history of other mental and behavioral disorders: Secondary | ICD-10-CM | POA: Insufficient documentation

## 2017-12-18 DIAGNOSIS — Z90710 Acquired absence of both cervix and uterus: Secondary | ICD-10-CM | POA: Insufficient documentation

## 2017-12-18 DIAGNOSIS — M7541 Impingement syndrome of right shoulder: Secondary | ICD-10-CM | POA: Insufficient documentation

## 2017-12-18 DIAGNOSIS — M7581 Other shoulder lesions, right shoulder: Secondary | ICD-10-CM | POA: Insufficient documentation

## 2017-12-18 DIAGNOSIS — S46011A Strain of muscle(s) and tendon(s) of the rotator cuff of right shoulder, initial encounter: Secondary | ICD-10-CM | POA: Insufficient documentation

## 2017-12-18 DIAGNOSIS — Y92096 Garden or yard of other non-institutional residence as the place of occurrence of the external cause: Secondary | ICD-10-CM | POA: Insufficient documentation

## 2017-12-18 DIAGNOSIS — Z881 Allergy status to other antibiotic agents status: Secondary | ICD-10-CM | POA: Insufficient documentation

## 2017-12-18 DIAGNOSIS — M1990 Unspecified osteoarthritis, unspecified site: Secondary | ICD-10-CM | POA: Insufficient documentation

## 2017-12-18 DIAGNOSIS — D649 Anemia, unspecified: Secondary | ICD-10-CM | POA: Insufficient documentation

## 2017-12-18 DIAGNOSIS — Z801 Family history of malignant neoplasm of trachea, bronchus and lung: Secondary | ICD-10-CM | POA: Insufficient documentation

## 2017-12-18 DIAGNOSIS — Y9389 Activity, other specified: Secondary | ICD-10-CM | POA: Insufficient documentation

## 2017-12-18 DIAGNOSIS — L309 Dermatitis, unspecified: Secondary | ICD-10-CM | POA: Insufficient documentation

## 2017-12-18 DIAGNOSIS — Z888 Allergy status to other drugs, medicaments and biological substances status: Secondary | ICD-10-CM | POA: Insufficient documentation

## 2017-12-18 DIAGNOSIS — K219 Gastro-esophageal reflux disease without esophagitis: Secondary | ICD-10-CM | POA: Insufficient documentation

## 2017-12-18 DIAGNOSIS — X509XXA Other and unspecified overexertion or strenuous movements or postures, initial encounter: Secondary | ICD-10-CM | POA: Insufficient documentation

## 2017-12-18 DIAGNOSIS — Z7982 Long term (current) use of aspirin: Secondary | ICD-10-CM | POA: Insufficient documentation

## 2017-12-18 DIAGNOSIS — Z79899 Other long term (current) drug therapy: Secondary | ICD-10-CM | POA: Insufficient documentation

## 2017-12-18 DIAGNOSIS — F17210 Nicotine dependence, cigarettes, uncomplicated: Secondary | ICD-10-CM | POA: Insufficient documentation

## 2017-12-18 DIAGNOSIS — Z85820 Personal history of malignant melanoma of skin: Secondary | ICD-10-CM | POA: Insufficient documentation

## 2017-12-18 HISTORY — DX: Psoriasis, unspecified: L40.9

## 2017-12-18 HISTORY — DX: Anxiety disorder, unspecified: F41.9

## 2017-12-18 HISTORY — PX: BICEPT TENODESIS: SHX5116

## 2017-12-18 HISTORY — PX: SHOULDER ARTHROSCOPY WITH OPEN ROTATOR CUFF REPAIR: SHX6092

## 2017-12-18 SURGERY — ARTHROSCOPY, SHOULDER WITH REPAIR, ROTATOR CUFF, OPEN
Anesthesia: General | Laterality: Right

## 2017-12-18 MED ORDER — MIDAZOLAM HCL 2 MG/2ML IJ SOLN
INTRAMUSCULAR | Status: AC
  Administered 2017-12-18: 1 mg via INTRAVENOUS
  Filled 2017-12-18: qty 2

## 2017-12-18 MED ORDER — ACETAMINOPHEN 10 MG/ML IV SOLN
INTRAVENOUS | Status: AC
  Filled 2017-12-18: qty 100

## 2017-12-18 MED ORDER — ONDANSETRON HCL 4 MG/2ML IJ SOLN: 4.0000 mg | Freq: Once | INTRAMUSCULAR | Status: DC | PRN

## 2017-12-18 MED ORDER — BUPIVACAINE HCL (PF) 0.5 % IJ SOLN
INTRAMUSCULAR | Status: AC
  Filled 2017-12-18: qty 10

## 2017-12-18 MED ORDER — LACTATED RINGERS IV SOLN
INTRAVENOUS | Status: DC | PRN
  Administered 2017-12-18: 10 mL

## 2017-12-18 MED ORDER — FENTANYL CITRATE (PF) 100 MCG/2ML IJ SOLN
INTRAMUSCULAR | Status: DC | PRN
  Administered 2017-12-18: 100 ug via INTRAVENOUS

## 2017-12-18 MED ORDER — LIDOCAINE HCL (PF) 1 % IJ SOLN
INTRAMUSCULAR | Status: AC
  Filled 2017-12-18: qty 5

## 2017-12-18 MED ORDER — PROPOFOL 10 MG/ML IV BOLUS
INTRAVENOUS | Status: AC
  Filled 2017-12-18: qty 20

## 2017-12-18 MED ORDER — BUPIVACAINE LIPOSOME 1.3 % IJ SUSP
INTRAMUSCULAR | Status: AC
  Filled 2017-12-18: qty 20

## 2017-12-18 MED ORDER — ROCURONIUM BROMIDE 100 MG/10ML IV SOLN
INTRAVENOUS | Status: DC | PRN
  Administered 2017-12-18: 30 mg via INTRAVENOUS
  Administered 2017-12-18: 40 mg via INTRAVENOUS
  Administered 2017-12-18: 30 mg via INTRAVENOUS
  Administered 2017-12-18: 10 mg via INTRAVENOUS

## 2017-12-18 MED ORDER — ACETAMINOPHEN 10 MG/ML IV SOLN
INTRAVENOUS | Status: DC | PRN
  Administered 2017-12-18: 1000 mg via INTRAVENOUS

## 2017-12-18 MED ORDER — ONDANSETRON 4 MG PO TBDP: 4.0000 mg | ORAL_TABLET | Freq: Three times a day (TID) | ORAL | 0 refills | Status: DC | PRN

## 2017-12-18 MED ORDER — PROPOFOL 10 MG/ML IV BOLUS
INTRAVENOUS | Status: DC | PRN
  Administered 2017-12-18: 120 mg via INTRAVENOUS

## 2017-12-18 MED ORDER — SEVOFLURANE IN SOLN
RESPIRATORY_TRACT | Status: AC
  Filled 2017-12-18: qty 250

## 2017-12-18 MED ORDER — FENTANYL CITRATE (PF) 100 MCG/2ML IJ SOLN
INTRAMUSCULAR | Status: AC
  Administered 2017-12-18: 50 ug via INTRAVENOUS
  Filled 2017-12-18: qty 2

## 2017-12-18 MED ORDER — BUPIVACAINE HCL (PF) 0.5 % IJ SOLN
INTRAMUSCULAR | Status: DC | PRN
  Administered 2017-12-18: 10 mL via PERINEURAL

## 2017-12-18 MED ORDER — SUGAMMADEX SODIUM 200 MG/2ML IV SOLN
INTRAVENOUS | Status: DC | PRN
  Administered 2017-12-18: 150 mg via INTRAVENOUS

## 2017-12-18 MED ORDER — IPRATROPIUM-ALBUTEROL 0.5-2.5 (3) MG/3ML IN SOLN
3.0000 mL | Freq: Once | RESPIRATORY_TRACT | Status: AC
  Administered 2017-12-18: 3 mL via RESPIRATORY_TRACT

## 2017-12-18 MED ORDER — SUCCINYLCHOLINE CHLORIDE 20 MG/ML IJ SOLN
INTRAMUSCULAR | Status: AC
  Filled 2017-12-18: qty 1

## 2017-12-18 MED ORDER — OXYCODONE HCL 5 MG PO TABS: 5.0000 mg | ORAL_TABLET | ORAL | 0 refills | Status: DC | PRN

## 2017-12-18 MED ORDER — MIDAZOLAM HCL 2 MG/2ML IJ SOLN
1.0000 mg | Freq: Once | INTRAMUSCULAR | Status: AC
  Administered 2017-12-18: 1 mg via INTRAVENOUS

## 2017-12-18 MED ORDER — FENTANYL CITRATE (PF) 100 MCG/2ML IJ SOLN: 25.0000 ug | INTRAMUSCULAR | Status: DC | PRN

## 2017-12-18 MED ORDER — FENTANYL CITRATE (PF) 100 MCG/2ML IJ SOLN
50.0000 ug | Freq: Once | INTRAMUSCULAR | Status: AC
  Administered 2017-12-18: 50 ug via INTRAVENOUS

## 2017-12-18 MED ORDER — SUGAMMADEX SODIUM 200 MG/2ML IV SOLN
INTRAVENOUS | Status: AC
  Filled 2017-12-18: qty 2

## 2017-12-18 MED ORDER — ONDANSETRON HCL 4 MG/2ML IJ SOLN
INTRAMUSCULAR | Status: DC | PRN
  Administered 2017-12-18: 4 mg via INTRAVENOUS

## 2017-12-18 MED ORDER — BUPIVACAINE LIPOSOME 1.3 % IJ SUSP
INTRAMUSCULAR | Status: DC | PRN
  Administered 2017-12-18: 20 mL via PERINEURAL

## 2017-12-18 MED ORDER — ONDANSETRON HCL 4 MG/2ML IJ SOLN
INTRAMUSCULAR | Status: AC
  Filled 2017-12-18: qty 2

## 2017-12-18 MED ORDER — DEXAMETHASONE SODIUM PHOSPHATE 10 MG/ML IJ SOLN
INTRAMUSCULAR | Status: DC | PRN
  Administered 2017-12-18: 10 mg via INTRAVENOUS

## 2017-12-18 MED ORDER — PHENYLEPHRINE HCL 10 MG/ML IJ SOLN
INTRAMUSCULAR | Status: DC | PRN
  Administered 2017-12-18 (×4): 100 ug via INTRAVENOUS

## 2017-12-18 MED ORDER — ACETAMINOPHEN 500 MG PO TABS: 1000.0000 mg | ORAL_TABLET | Freq: Three times a day (TID) | ORAL | 2 refills | Status: AC

## 2017-12-18 MED ORDER — CEFAZOLIN SODIUM-DEXTROSE 2-4 GM/100ML-% IV SOLN
2.0000 g | Freq: Once | INTRAVENOUS | Status: AC
  Administered 2017-12-18: 2 g via INTRAVENOUS

## 2017-12-18 MED ORDER — LIDOCAINE HCL (PF) 1 % IJ SOLN
INTRAMUSCULAR | Status: DC | PRN
  Administered 2017-12-18: 5 mL via SUBCUTANEOUS

## 2017-12-18 MED ORDER — LIDOCAINE-EPINEPHRINE 1 %-1:100000 IJ SOLN
INTRAMUSCULAR | Status: DC | PRN
  Administered 2017-12-18: 20 mL

## 2017-12-18 MED ORDER — CEFAZOLIN SODIUM-DEXTROSE 2-4 GM/100ML-% IV SOLN
INTRAVENOUS | Status: AC
  Filled 2017-12-18: qty 100

## 2017-12-18 MED ORDER — LACTATED RINGERS IV SOLN
INTRAVENOUS | Status: DC
  Administered 2017-12-18: 07:00:00 via INTRAVENOUS

## 2017-12-18 MED ORDER — SODIUM CHLORIDE 0.9 % IV SOLN
INTRAVENOUS | Status: DC | PRN
  Administered 2017-12-18: 15 ug/min via INTRAVENOUS

## 2017-12-18 MED ORDER — ROCURONIUM BROMIDE 100 MG/10ML IV SOLN
INTRAVENOUS | Status: AC
  Filled 2017-12-18: qty 1

## 2017-12-18 MED ORDER — FENTANYL CITRATE (PF) 100 MCG/2ML IJ SOLN
INTRAMUSCULAR | Status: AC
  Filled 2017-12-18: qty 2

## 2017-12-18 MED ORDER — DEXAMETHASONE SODIUM PHOSPHATE 10 MG/ML IJ SOLN
INTRAMUSCULAR | Status: AC
  Filled 2017-12-18: qty 1

## 2017-12-18 MED ORDER — IPRATROPIUM-ALBUTEROL 0.5-2.5 (3) MG/3ML IN SOLN
RESPIRATORY_TRACT | Status: AC
  Filled 2017-12-18: qty 3

## 2017-12-18 MED ORDER — ASPIRIN EC 325 MG PO TBEC: 325.0000 mg | DELAYED_RELEASE_TABLET | Freq: Every day | ORAL | 0 refills | Status: AC

## 2017-12-18 SURGICAL SUPPLY — 79 items
ADAPTER IRRIG TUBE 2 SPIKE SOL (ADAPTER) ×4 IMPLANT
ANCHOR 2.3 SP 3 1.2 XBRAID (Anchor) ×4 IMPLANT
ANCHOR 2.3 SP SGL 1.2 XBRAID (Anchor) ×4 IMPLANT
ANCHOR SUT BIO SW 4.75X19.1 (Anchor) ×6 IMPLANT
ANCHOR SUT FBRTK SUTURETAP 1.3 (Anchor) ×2 IMPLANT
BLADE OSCILLATING/SAGITTAL (BLADE)
BLADE SW THK.38XMED LNG THN (BLADE) IMPLANT
BUR BR 5.5 12 FLUTE (BURR) ×2 IMPLANT
BUR RADIUS 4.0X18.5 (BURR) ×2 IMPLANT
CANNULA 5.75X7 CRYSTAL CLEAR (CANNULA) IMPLANT
CANNULA PARTIAL THREAD 2X7 (CANNULA) ×2 IMPLANT
CANNULA TWIST IN 8.25X9CM (CANNULA) ×2 IMPLANT
CHLORAPREP W/TINT 26ML (MISCELLANEOUS) ×2 IMPLANT
COOLER POLAR GLACIER W/PUMP (MISCELLANEOUS) ×2 IMPLANT
COVER LIGHT HANDLE STERIS (MISCELLANEOUS) ×2 IMPLANT
CRADLE LAMINECT ARM (MISCELLANEOUS) ×4 IMPLANT
DERMABOND ADVANCED (GAUZE/BANDAGES/DRESSINGS)
DERMABOND ADVANCED .7 DNX12 (GAUZE/BANDAGES/DRESSINGS) IMPLANT
DRAPE IMP U-DRAPE 54X76 (DRAPES) ×4 IMPLANT
DRAPE INCISE IOBAN 66X45 STRL (DRAPES) ×2 IMPLANT
DRAPE SHEET LG 3/4 BI-LAMINATE (DRAPES) ×2 IMPLANT
DRAPE STERI 35X30 U-POUCH (DRAPES) ×2 IMPLANT
DRAPE U-SHAPE 47X51 STRL (DRAPES) ×2 IMPLANT
ELECT REM PT RETURN 9FT ADLT (ELECTROSURGICAL) ×2
ELECTRODE REM PT RTRN 9FT ADLT (ELECTROSURGICAL) ×1 IMPLANT
GAUZE PETRO XEROFOAM 1X8 (MISCELLANEOUS) ×2 IMPLANT
GAUZE SPONGE 4X4 12PLY STRL (GAUZE/BANDAGES/DRESSINGS) ×2 IMPLANT
GLOVE BIOGEL PI IND STRL 8 (GLOVE) ×1 IMPLANT
GLOVE BIOGEL PI INDICATOR 8 (GLOVE) ×1
GLOVE SURG SYN 8.0 (GLOVE) ×2 IMPLANT
GOWN STRL REUS W/ TWL LRG LVL3 (GOWN DISPOSABLE) ×1 IMPLANT
GOWN STRL REUS W/TWL LRG LVL3 (GOWN DISPOSABLE) ×1
GOWN STRL REUS W/TWL LRG LVL4 (GOWN DISPOSABLE) ×2 IMPLANT
IV LACTATED RINGER IRRG 3000ML (IV SOLUTION) ×10
IV LR IRRIG 3000ML ARTHROMATIC (IV SOLUTION) ×10 IMPLANT
KIT SPEAR STR 1.6MM DRILL (MISCELLANEOUS) ×4 IMPLANT
KIT STABILIZATION SHOULDER (MISCELLANEOUS) ×2 IMPLANT
KIT SUTURETAK 3.0 INSERT PERC (KITS) IMPLANT
KIT TURNOVER KIT A (KITS) ×2 IMPLANT
MANIFOLD NEPTUNE II (INSTRUMENTS) ×4 IMPLANT
MASK FACE SPIDER DISP (MASK) ×2 IMPLANT
MAT ABSORB  FLUID 56X50 GRAY (MISCELLANEOUS) ×2
MAT ABSORB FLUID 56X50 GRAY (MISCELLANEOUS) ×2 IMPLANT
NDL SAFETY ECLIPSE 18X1.5 (NEEDLE) ×1 IMPLANT
NEEDLE HYPO 18GX1.5 SHARP (NEEDLE) ×1
NEEDLE HYPO 22GX1.5 SAFETY (NEEDLE) ×2 IMPLANT
NEEDLE MAYO 6 CRC TAPER PT (NEEDLE) ×2 IMPLANT
PACK ARTHROSCOPY SHOULDER (MISCELLANEOUS) ×2 IMPLANT
PAD ABD DERMACEA PRESS 5X9 (GAUZE/BANDAGES/DRESSINGS) ×2 IMPLANT
PAD WRAPON POLAR SHDR XLG (MISCELLANEOUS) ×1 IMPLANT
PASSER SUT COBRA CAPTURE (INSTRUMENTS) ×2 IMPLANT
SET TUBE SUCT SHAVER OUTFL 24K (TUBING) ×2 IMPLANT
SET TUBE TIP INTRA-ARTICULAR (MISCELLANEOUS) ×2 IMPLANT
SLING ULTRA II M (MISCELLANEOUS) ×2 IMPLANT
STAPLER SKIN PROX 35W (STAPLE) ×2 IMPLANT
STRAP SAFETY 5IN WIDE (MISCELLANEOUS) ×2 IMPLANT
STRIP CLOSURE SKIN 1/2X4 (GAUZE/BANDAGES/DRESSINGS) IMPLANT
SUT ETHILON 3-0 (SUTURE) IMPLANT
SUT ETHILON 4-0 (SUTURE) ×1
SUT ETHILON 4-0 FS2 18XMFL BLK (SUTURE) ×1
SUT FIBERWIRE #2 38 T-5 BLUE (SUTURE) ×4
SUT LASSO 90 DEG SD STR (SUTURE) IMPLANT
SUT MNCRL 4-0 (SUTURE) ×1
SUT MNCRL 4-0 27XMFL (SUTURE) ×1
SUT PROLENE 0 CT 2 (SUTURE) IMPLANT
SUT TICRON 2-0 30IN 311381 (SUTURE) IMPLANT
SUT VIC AB 0 CT1 36 (SUTURE) ×2 IMPLANT
SUT VIC AB 2-0 CT2 27 (SUTURE) ×2 IMPLANT
SUT VICRYL 3-0 27IN (SUTURE) IMPLANT
SUTURE ETHLN 4-0 FS2 18XMF BLK (SUTURE) ×1 IMPLANT
SUTURE FIBERWR #2 38 T-5 BLUE (SUTURE) ×2 IMPLANT
SUTURE MNCRL 4-0 27XMF (SUTURE) ×1 IMPLANT
SYR 10ML LL (SYRINGE) ×2 IMPLANT
TAPE CLOTH 3X10 WHT NS LF (GAUZE/BANDAGES/DRESSINGS) ×2 IMPLANT
TAPE MICROFOAM 4IN (TAPE) ×2 IMPLANT
TUBING ARTHRO INFLOW-ONLY STRL (TUBING) ×2 IMPLANT
TUBING CONNECTING 10 (TUBING) ×2 IMPLANT
WAND HAND CNTRL MULTIVAC 90 (MISCELLANEOUS) ×2 IMPLANT
WRAPON POLAR PAD SHDR XLG (MISCELLANEOUS) ×2

## 2017-12-18 NOTE — Anesthesia Procedure Notes (Signed)
Anesthesia Regional Block: Interscalene brachial plexus block   Pre-Anesthetic Checklist: ,, timeout performed, Correct Patient, Correct Site, Correct Laterality, Correct Procedure, Correct Position, site marked, Risks and benefits discussed,  Surgical consent,  Pre-op evaluation,  At surgeon's request and post-op pain management  Laterality: Right and Upper  Prep: chloraprep       Needles:  Injection technique: Single-shot  Needle Type: Stimiplex     Needle Length: 9cm  Needle Gauge: 22     Additional Needles:   Procedures:, nerve stimulator,,,,,,,  Narrative:  Start time: 12/18/2017 7:36 AM End time: 12/18/2017 7:40 AM Injection made incrementally with aspirations every 5 mL.  Performed by: Personally  Anesthesiologist: Martha Clan, MD  Additional Notes: Functioning IV was confirmed and monitors were applied.  A 72mm 22ga Stimuplex needle was used. Sterile prep and drape,hand hygiene and sterile gloves were used.  Negative aspiration and negative test dose prior to incremental administration of local anesthetic. The patient tolerated the procedure well.

## 2017-12-18 NOTE — H&P (Signed)
Paper H&P to be scanned into permanent record. H&P reviewed. No significant changes noted.  

## 2017-12-18 NOTE — Progress Notes (Signed)
Dr. Rosey Bath came and saw the patient in PACU, stated it was okay for the patient to go home with current O2 levels between 90-93.

## 2017-12-18 NOTE — Transfer of Care (Signed)
Immediate Anesthesia Transfer of Care Note  Patient: Cindy Morrison  Procedure(s) Performed: SHOULDER ARTHROSCOPY WITH OPEN ROTATOR CUFF REPAIR,DISTAL CLAVICLE EXCISION,SUBACROMIAL DECOMPRESSION (Right ) BICEPS TENODESIS (Right )  Patient Location: PACU  Anesthesia Type:General  Level of Consciousness: sedated  Airway & Oxygen Therapy: Patient Spontanous Breathing and Patient connected to face mask oxygen  Post-op Assessment: Report given to RN and Post -op Vital signs reviewed and stable  Post vital signs: Reviewed and stable  Last Vitals:  Vitals Value Taken Time  BP 105/67 12/18/2017 11:45 AM  Temp 36.4 C 12/18/2017 11:45 AM  Pulse 80 12/18/2017 11:48 AM  Resp 17 12/18/2017 11:48 AM  SpO2 95 % 12/18/2017 11:48 AM  Vitals shown include unvalidated device data.  Last Pain:  Vitals:   12/18/17 1145  TempSrc:   PainSc: (P) Asleep         Complications: No apparent anesthesia complications

## 2017-12-18 NOTE — Op Note (Signed)
SURGERY DATE: 12/18/2017  PRE-OP DIAGNOSIS:  1. Right subacromial impingement 2. Right biceps tendinopathy 3. Right rotator cuff tear 4. Right acromioclavicular joint osteoarthritis  POST-OP DIAGNOSIS: 1. Right subacromial impingement 2. Right biceps tendinopathy 3. Right rotator cuff tear 4. Right acromioclavicular joint osteoarthritis  PROCEDURES:  1. Right mini-open rotator cuff repair including supraspinatus, infraspinatus, and subscapularis 2. Right open biceps tenodesis 3. Right arthroscopic distal clavicle excision 4. Right arthroscopic extensive debridement of shoulder (glenohumeral and subacromial spaces) 5. Right arthroscopic subacromial decompression  SURGEON: Cato Mulligan, MD  ANESTHESIA: Gen with Exparil interscalene block  ESTIMATED BLOOD LOSS: 25cc  DRAINS:  none  TOTAL IV FLUIDS: per anesthesia   SPECIMENS: none  IMPLANTS:  - Arthrex FiberTak 1.44mm Suture Anchor - x1 - Arthrex 4.52mm SwiveLock - x3 - Iconix Speed 2.81mm Anchor - x3  OPERATIVE FINDINGS:  Examination under anesthesia: A careful examination under anesthesia was performed.  Passive range of motion was: FF: 160; ER at side: 50; ER in abduction: 95; IR in abduction: 50.  Anterior load shift: NT.  Posterior load shift: NT.  Sulcus in neutral: NT.  Sulcus in ER: NT.    Intra-operative findings: A thorough arthroscopic examination of the shoulder was performed.  The findings are: 1. Biceps tendon: tendinopathy with significant erythema and fraying as well as medial subluxation 2. Superior labrum: injected with surrounding synovitis 3. Posterior labrum and capsule: normal 4. Inferior capsule and inferior recess: normal 5. Glenoid cartilage surface: normal 6. Supraspinatus attachment: full-thickness tear 7. Posterior rotator cuff attachment: tear of anterior infraspinatus fibers 8. Humeral head articular cartilage: normal 9. Rotator interval: significant synovitis 10: Subscapularis tendon:  partial thickness tear of superior subscapularis 11. Anterior labrum: degenerative 12. IGHL: normal  OPERATIVE REPORT:   Indications for procedure: Cindy Morrison is a 62 y.o. year old female with ~4 months of R shoulder pain that has failed non-operative management including activity modification, physical therapy, and corticosteroid injection. She also had 2 falls ~4 months ago. She did not have any prior shoulder pain prior to these falls.  Clinical exam and MRI were suggestive rotator cuff tear, biceps tendinopathy, subacromial impingement, and acromioclavicular joint arthritis.  After discussion of risks, benefits, and alternatives to surgery, the patient elected to proceed.   Procedure in detail:  I identified Cindy Morrison in the pre-operative holding area.  I marked the operative shoulder with my initials. I reviewed the risks and benefits of the proposed surgical intervention, and the patient (and/or patient's guardian) wished to proceed.  Anesthesia was then performed with an Exparil interscalene block.  The patient was transferred to the operative suite and placed in the beach chair position.    SCDs were placed on the lower extremities. Appropriate IV antibiotics were administered prior to incision. The operative upper extremity was then prepped and draped in standard fashion. A time out was performed confirming the correct extremity, correct patient, and correct procedure.   I then created a standard posterior portal with an 11 blade. The glenohumeral joint was easily entered with a blunt trochar and the arthroscope introduced. The findings of diagnostic arthroscopy are described above. I debrided degenerative tissue including the synovitic tissue about the rotator interval and anterior and superior labrum. I then coagulated the inflamed synovium to obtain hemostasis and reduce the risk of post-operative swelling using an Arthrocare radiofrequency device. I performed a biceps tenotomy using  an arthroscopic scissors and used a motorized shaver to debride the stump back to a stable base.  Next, the arthroscope was then introduced into the subacromial space. A direct lateral portal was created with an 11-blade after spinal needle localization. An extensive subacromial bursectomy was performed using a combination of the shaver and Arthrocare wand. The entire acromial undersurface was exposed and the CA ligament was subperiosteally elevated to expose the anterior acromial hook. A 5.46mm barrel burr was used to create a flat anterior and lateral aspect of the acromion, converting it from a Type 3 to a Type 1 acromion. Care was made to keep the deltoid fascia intact.  I then turned my attention to the arthroscopic distal clavicle excision. I identified the acromioclavicular joint. Surrounding bursal tissue was debrided and the edges of the joint were identified. I used the 5.84mm barrel burr to remove the distal clavicle parallel to the edge of the acromion. I was able to fit two widths of the burr into the space between the distal clavicle and acromion, signifying that I had removed ~56mm of distal clavicle. This was confirmed by viewing anteriorly and introducing a probe with measuring marks from the lateral portal. Hemostasis was achieved with an Arthrocare wand. Fluid was evacuated from the shoulder.   A longitudinal incision from the anterolateral acromion ~6cm in length was made overlying the raphe between the anterior and middle heads of the deltoid. The raphe was identified and it was incised. The subacromial space was identified. Any remaining bursa was excised. The rotator cuff tear was identified. It was an L-shaped tear with the long limb of the L anterior.   We then turned our attention to the biceps tenodesis.  The bicipital groove was identified.  A 15 blade was used to make a cut overlying the biceps tendon, and the tendon was removed using a right angle clamp.  The base of the  bicipital groove was identified and cleared of soft tissue.  A FiberTak anchor was placed in the bicipital groove.  The biceps tendon was held at the appropriate amount of tension.  One set of sutures was passed through the biceps anchor with one limb passed in a simple fashion and the second limb passed in a simple plus locking stitch pattern.  This was repeated for the other set of sutures.  This construct allowed for shuttling the biceps tendon down to the bone.  The sutures were tied and cut.  The diseased portion of the proximal biceps was then excised.  The subscapularis tear was identified. The superior portion of the subscapularis was torn. An Iconix Speed Anchor was placed in the superior portion of the lesser tuberosity after the lesser tuberosity was debrided of soft tissue and bony bed prepared. Two sets of sutures were passed through the subscapularis and tied. These were brought to an Arthrex 4.60mm SwiveLock anchor to serve as a lateral row anchor. This was placed in the superior portion of the bicipital groove. This achieved excellent compression of the subscapularis down to the lesser tuberosity.   The arm was then internally rotated.  Two Iconix Speed anchors were placed just lateral to the articular margin (one anteriorly, one posteriorly). The rotator cuff footprint was cleared of soft tissue and channels for egress were placed in the footprint using the Iconix Speed delivery handle.  All sutures were passed appropriately in a posterior to anterior fashion. The 1.33mm tapes were tied to each other and this reduced the tear nicely. Two 4.60mm SwiveLock anchors were placed for the lateral row anchors with appropriate sutures passed through each anchor.  This allowed  for excellent reapproximation of the rotator cuff over its footprint.  The construct was stable with external and internal rotation.  The wound was thoroughly irrigated.  The deltoid split was closed with 0 Vicryl.  The subdermal  layer was closed with 2-0 Vicryl.  The skin was closed with 4-0 Monocryl. The portals were closed with 3-0 Nylon. Xeroform was applied to the incisions. A sterile dressing was applied, followed by a Polar Care sleeve and a SlingShot shoulder immobilizer/sling. The patient awoke from anesthesia without difficulty and was transferred to the PACU in stable condition.     COMPLICATIONS: none  DISPOSITION: plan for discharge home after recovery in PACU   POSTOPERATIVE PLAN: Remain in sling (except hygiene and elbow/wrist/hand RoM exercises as instructed by PT) x 6 weeks and NWB for this time. PT to begin 3-4 days after surgery. Rotator cuff repair +subscapularis repair and biceps tenodesis rehab protocol.

## 2017-12-18 NOTE — OR Nursing (Signed)
Patient reinstructed on use of inspirometer. Able to use it efficiently. No SOB. Pulse ox at 92-94% while in post op. 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 Reviewed her limitations for helping her and providing hands on care. Patient to call other family members to help.

## 2017-12-18 NOTE — Anesthesia Procedure Notes (Signed)
Procedure Name: Intubation Date/Time: 12/18/2017 7:57 AM Performed by: Jonna Clark, CRNA Pre-anesthesia Checklist: Patient identified, Patient being monitored, Timeout performed, Emergency Drugs available and Suction available Patient Re-evaluated:Patient Re-evaluated prior to induction Oxygen Delivery Method: Circle system utilized Preoxygenation: Pre-oxygenation with 100% oxygen Induction Type: IV induction Ventilation: Mask ventilation without difficulty Laryngoscope Size: Mac and 3 Grade View: Grade I Tube type: Oral Tube size: 7.0 mm Number of attempts: 1 Placement Confirmation: ETT inserted through vocal cords under direct vision,  positive ETCO2 and breath sounds checked- equal and bilateral Secured at: 22 cm Tube secured with: Tape Dental Injury: Teeth and Oropharynx as per pre-operative assessment

## 2017-12-18 NOTE — Discharge Instructions (Signed)
Post-Op Instructions - Rotator Cuff Repair  1. Bracing: You will wear a shoulder immobilizer or sling for 6 weeks.   2. Driving: No driving for 3 weeks post-op. When driving, do not wear the immobilizer. Ideally, we recommend no driving for 6 weeks while sling is in place as one arm will be immobilized.   3. Activity: No active lifting for 2 months. Wrist, hand, and elbow motion only. Avoid lifting the upper arm away from the body except for hygiene. You are permitted to bend and straighten the elbow passively only (no active elbow motion). You may use your hand and wrist for typing, writing, and managing utensils (cutting food). Do not lift more than a coffee cup for 8 weeks.  When sleeping or resting, inclined positions (recliner chair or wedge pillow) and a pillow under the forearm for support may provide better comfort for up to 4 weeks.  Avoid long distance travel for 4 weeks.  Return to normal activities after rotator cuff repair repair normally takes 6 months on average. If rehab goes very well, may be able to do most activities at 4 months, except overhead or contact sports.  4. Physical Therapy: Begins 3-4 days after surgery, and proceed 1 time per week for the first 6 weeks, then 1-2 times per week from weeks 6-20 post-op.  5. Medications:  - You will be provided a prescription for narcotic pain medicine. After surgery, take 1-2 narcotic tablets every 4 hours if needed for severe pain.  - A prescription for anti-nausea medication will be provided in case the narcotic medicine causes nausea - take 1 tablet every 6 hours only if nauseated.   - Take tylenol 1000 mg (2 Extra Strength tablets or 3 regular strength) every 8 hours for pain.  May decrease or stop tylenol 5 days after surgery if you are having minimal pain. - Take ASA 325mg/day x 2 weeks to help prevent DVTs/PEs (blood clots).  - DO NOT take ANY nonsteroidal anti-inflammatory pain medications (Advil, Motrin, Ibuprofen, Aleve,  Naproxen, or Naprosyn). These medicines can inhibit healing of your shoulder repair.    If you are taking prescription medication for anxiety, depression, insomnia, muscle spasm, chronic pain, or for attention deficit disorder, you are advised that you are at a higher risk of adverse effects with use of narcotics post-op, including narcotic addiction/dependence, depressed breathing, death. If you use non-prescribed substances: alcohol, marijuana, cocaine, heroin, methamphetamines, etc., you are at a higher risk of adverse effects with use of narcotics post-op, including narcotic addiction/dependence, depressed breathing, death. You are advised that taking > 50 morphine milligram equivalents (MME) of narcotic pain medication per day results in twice the risk of overdose or death. For your prescription provided: oxycodone 5 mg - taking more than 6 tablets per day would result in > 50 morphine milligram equivalents (MME) of narcotic pain medication. Be advised that we will prescribe narcotics short-term, for acute post-operative pain only - 3 weeks for major operations such as shoulder repair/reconstruction surgeries.     6. Post-Op Appointment:  Your first post-op appointment will be 10-14 days post-op.  7. Work or School: For most, but not all procedures, we advise staying out of work or school for at least 1 to 2 weeks in order to recover from the stress of surgery and to allow time for healing.   If you need a work or school note this can be provided.   8. Smoking: If you are a smoker, you need to refrain from   smoking in the postoperative period. The nicotine in cigarettes will inhibit healing of your shoulder repair and decrease the chance of successful repair. Similarly, nicotine containing products (gum, patches) should be avoided.  ° °Post-operative Brace: °Apply and remove the brace you received as you were instructed to at the time of fitting and as described in detail as the brace’s  instructions for use indicate.  Wear the brace for the period of time prescribed by your physician.  The brace can be cleaned with soap and water and allowed to air dry only.  Should the brace result in increased pain, decreased feeling (numbness/tingling), increased swelling or an overall worsening of your medical condition, please contact your doctor immediately.  If an emergency situation occurs as a result of wearing the brace after normal business hours, please dial 911 and seek immediate medical attention.  Let your doctor know if you have any further questions about the brace issued to you. °Refer to the shoulder sling instructions for use if you have any questions regarding the correct fit of your shoulder sling.  °[employer] Customer Care for Troubleshooting: [phone] ° °Video that illustrates how to properly use a shoulder sling: °"Instructions for Proper Use of an Orthopaedic Sling" °https://www.youtube.com/watch?v=AHZpn_Xo45w ° ° ° °   Interscalene Nerve Block with Exparel ° °1.  For your surgery you have received an Interscalene Nerve Block with Exparel. °2. Nerve Blocks affect many types of nerves, including nerves that control movement, pain and normal sensation.  You may experience feelings such as numbness, tingling, heaviness, weakness or the inability to move your arm or the feeling or sensation that your arm has "fallen asleep". °3. A nerve block with Exparel can last up to 5 days.  Usually the weakness wears off first.  The tingling and heaviness usually wear off next.  Finally you may start to notice pain.  Keep in mind that this may occur in any order.  Once a nerve block starts to wear off it is usually completely gone within 60 minutes. °4. ISNB may cause mild shortness of breath, a hoarse voice, blurry vision, unequal pupils, or drooping of the face on the same side as the nerve block.  These symptoms will usually resolve with the numbness.  Very rarely the procedure itself can cause mild  seizures. °5. If needed, your surgeon will give you a prescription for pain medication.  It will take about 60 minutes for the oral pain medication to become fully effective.  So, it is recommended that you start taking this medication before the nerve block first begins to wear off, or when you first begin to feel discomfort. °6. Take your pain medication only as prescribed.  Pain medication can cause sedation and decrease your breathing if you take more than you need for the level of pain that you have. °7. Nausea is a common side effect of many pain medications.  You may want to eat something before taking your pain medicine to prevent nausea. °8. After an Interscalene nerve block, you cannot feel pain, pressure or extremes in temperature in the effected arm.  Because your arm is numb it is at an increased risk for injury.  To decrease the possibility of injury, please practice the following: ° °a. While you are awake change the position of your arm frequently to prevent too much pressure on any one area for prolonged periods of time. °b.  If you have a cast or tight dressing, check the color or your fingers every couple   of hours.  Call your surgeon with the appearance of any discoloration (white or blue). °c. If you are given a sling to wear before you go home, please wear it  at all times until the block has completely worn off.  Do not get up at night without your sling. °d. Please contact [facility] or your surgeon if you do not begin to regain sensation after 7 days from the surgery.  Anesthesia may be contacted by calling the Same Day Surgery Department, Mon. through Fri., 6 am to 4 pm at [phone].   °e. If you experience any other problems or concerns, please contact your surgeon's office. °f. If you experience severe or prolonged shortness of breath go to the nearest emergency department. ° ° °AMBULATORY SURGERY  °DISCHARGE INSTRUCTIONS ° ° °1) The drugs that you were given will stay in your  system until tomorrow so for the next 24 hours you should not: ° °A) Drive an automobile °B) Make any legal decisions °C) Drink any alcoholic beverage ° ° °2) You may resume regular meals tomorrow.  Today it is better to start with liquids and gradually work up to solid foods. ° °You may eat anything you prefer, but it is better to start with liquids, then soup and crackers, and gradually work up to solid foods. ° ° °3) Please notify your doctor immediately if you have any unusual bleeding, trouble breathing, redness and pain at the surgery site, drainage, fever, or pain not relieved by medication. ° ° ° °4) Additional Instructions: ° ° ° ° ° ° ° °Please contact your physician with any problems or Same Day Surgery at [phone], Monday through Friday 6 am to 4 pm, or [place] at [place] Main number at [phone]. °

## 2017-12-18 NOTE — Anesthesia Preprocedure Evaluation (Signed)
Anesthesia Evaluation  Patient identified by MRN, date of birth, ID band Patient awake    Reviewed: Allergy & Precautions, H&P , NPO status , Patient's Chart, lab work & pertinent test results, reviewed documented beta blocker date and time   History of Anesthesia Complications Negative for: history of anesthetic complications  Airway Mallampati: III  TM Distance: >3 FB Neck ROM: full    Dental  (+) Dental Advidsory Given, Teeth Intact   Pulmonary neg pulmonary ROS, neg shortness of breath, neg COPD, neg recent URI, Current Smoker,           Cardiovascular Exercise Tolerance: Good negative cardio ROS       Neuro/Psych negative neurological ROS  negative psych ROS   GI/Hepatic Neg liver ROS, GERD  ,  Endo/Other  negative endocrine ROS  Renal/GU negative Renal ROS  negative genitourinary   Musculoskeletal   Abdominal   Peds  Hematology negative hematology ROS (+)   Anesthesia Other Findings Past Medical History: No date: Anxiety No date: Cancer ([place])     Comment:  choroid melanoma left eye No date: GERD (gastroesophageal reflux disease) No date: Psoriasis   Reproductive/Obstetrics negative OB ROS                             Anesthesia Physical Anesthesia Plan  ASA: II  Anesthesia Plan: General   Post-op Pain Management:  Regional for Post-op pain   Induction: Intravenous  PONV Risk Score and Plan: 2 and Ondansetron and Dexamethasone  Airway Management Planned: Oral ETT  Additional Equipment:   Intra-op Plan:   Post-operative Plan: Extubation in OR  Informed Consent: I have reviewed the patients History and Physical, chart, labs and discussed the procedure including the risks, benefits and alternatives for the proposed anesthesia with the patient or authorized representative who has indicated his/her understanding and acceptance.   Dental Advisory Given  Plan Discussed  with: Anesthesiologist, CRNA and Surgeon  Anesthesia Plan Comments:         Anesthesia Quick Evaluation

## 2017-12-18 NOTE — Anesthesia Post-op Follow-up Note (Signed)
Anesthesia QCDR form completed.        

## 2017-12-20 NOTE — Anesthesia Postprocedure Evaluation (Signed)
Anesthesia Post Note  Patient: Cindy Morrison  Procedure(s) Performed: SHOULDER ARTHROSCOPY WITH OPEN ROTATOR CUFF REPAIR,DISTAL CLAVICLE EXCISION,SUBACROMIAL DECOMPRESSION (Right ) BICEPS TENODESIS (Right )  Patient location during evaluation: PACU Anesthesia Type: General and Regional Level of consciousness: awake and alert Pain management: pain level controlled Vital Signs Assessment: post-procedure vital signs reviewed and stable Respiratory status: spontaneous breathing, nonlabored ventilation, respiratory function stable and patient connected to nasal cannula oxygen Cardiovascular status: blood pressure returned to baseline and stable Postop Assessment: no apparent nausea or vomiting Anesthetic complications: no     Last Vitals:  Vitals:   12/18/17 1425 12/18/17 1533  BP: 111/73 116/78  Pulse: 93 89  Resp: 18   Temp: 36.6 C   SpO2: 91%     Last Pain:  Vitals:   12/18/17 1533  TempSrc:   PainSc: 0-No pain                 Martha Clan

## 2018-11-01 ENCOUNTER — Encounter: Payer: Self-pay | Admitting: Emergency Medicine

## 2018-11-01 ENCOUNTER — Inpatient Hospital Stay: Payer: Self-pay

## 2018-11-01 ENCOUNTER — Other Ambulatory Visit: Payer: Self-pay

## 2018-11-01 ENCOUNTER — Emergency Department: Payer: Managed Care, Other (non HMO)

## 2018-11-01 ENCOUNTER — Inpatient Hospital Stay: Payer: Managed Care, Other (non HMO)

## 2018-11-01 ENCOUNTER — Inpatient Hospital Stay
Admission: EM | Admit: 2018-11-01 | Discharge: 2018-11-22 | DRG: 391 | Disposition: A | Discharge status: Skilled Nursing Facility | Payer: Managed Care, Other (non HMO) | Attending: Internal Medicine | Admitting: Internal Medicine

## 2018-11-01 DIAGNOSIS — A419 Sepsis, unspecified organism: Secondary | ICD-10-CM

## 2018-11-01 DIAGNOSIS — Z20828 Contact with and (suspected) exposure to other viral communicable diseases: Secondary | ICD-10-CM | POA: Diagnosis present

## 2018-11-01 DIAGNOSIS — R6521 Severe sepsis with septic shock: Secondary | ICD-10-CM

## 2018-11-01 DIAGNOSIS — F419 Anxiety disorder, unspecified: Secondary | ICD-10-CM | POA: Diagnosis present

## 2018-11-01 DIAGNOSIS — R05 Cough: Secondary | ICD-10-CM

## 2018-11-01 DIAGNOSIS — E860 Dehydration: Secondary | ICD-10-CM | POA: Diagnosis present

## 2018-11-01 DIAGNOSIS — E871 Hypo-osmolality and hyponatremia: Secondary | ICD-10-CM | POA: Diagnosis present

## 2018-11-01 DIAGNOSIS — Z720 Tobacco use: Secondary | ICD-10-CM | POA: Diagnosis not present

## 2018-11-01 DIAGNOSIS — Z881 Allergy status to other antibiotic agents status: Secondary | ICD-10-CM | POA: Diagnosis not present

## 2018-11-01 DIAGNOSIS — R0902 Hypoxemia: Secondary | ICD-10-CM

## 2018-11-01 DIAGNOSIS — G8929 Other chronic pain: Secondary | ICD-10-CM | POA: Diagnosis not present

## 2018-11-01 DIAGNOSIS — J9602 Acute respiratory failure with hypercapnia: Secondary | ICD-10-CM | POA: Diagnosis not present

## 2018-11-01 DIAGNOSIS — F17210 Nicotine dependence, cigarettes, uncomplicated: Secondary | ICD-10-CM | POA: Diagnosis present

## 2018-11-01 DIAGNOSIS — T4275XA Adverse effect of unspecified antiepileptic and sedative-hypnotic drugs, initial encounter: Secondary | ICD-10-CM | POA: Diagnosis not present

## 2018-11-01 DIAGNOSIS — J9600 Acute respiratory failure, unspecified whether with hypoxia or hypercapnia: Secondary | ICD-10-CM

## 2018-11-01 DIAGNOSIS — A084 Viral intestinal infection, unspecified: Secondary | ICD-10-CM | POA: Diagnosis present

## 2018-11-01 DIAGNOSIS — G92 Toxic encephalopathy: Secondary | ICD-10-CM | POA: Diagnosis not present

## 2018-11-01 DIAGNOSIS — R410 Disorientation, unspecified: Secondary | ICD-10-CM | POA: Diagnosis not present

## 2018-11-01 DIAGNOSIS — L304 Erythema intertrigo: Secondary | ICD-10-CM | POA: Diagnosis not present

## 2018-11-01 DIAGNOSIS — J9601 Acute respiratory failure with hypoxia: Secondary | ICD-10-CM

## 2018-11-01 DIAGNOSIS — Z801 Family history of malignant neoplasm of trachea, bronchus and lung: Secondary | ICD-10-CM

## 2018-11-01 DIAGNOSIS — M545 Low back pain: Secondary | ICD-10-CM | POA: Diagnosis not present

## 2018-11-01 DIAGNOSIS — L409 Psoriasis, unspecified: Secondary | ICD-10-CM | POA: Diagnosis present

## 2018-11-01 DIAGNOSIS — Z9911 Dependence on respirator [ventilator] status: Secondary | ICD-10-CM | POA: Diagnosis not present

## 2018-11-01 DIAGNOSIS — Z4659 Encounter for fitting and adjustment of other gastrointestinal appliance and device: Secondary | ICD-10-CM

## 2018-11-01 DIAGNOSIS — E876 Hypokalemia: Secondary | ICD-10-CM | POA: Diagnosis present

## 2018-11-01 DIAGNOSIS — I959 Hypotension, unspecified: Secondary | ICD-10-CM

## 2018-11-01 DIAGNOSIS — M5440 Lumbago with sciatica, unspecified side: Secondary | ICD-10-CM | POA: Diagnosis not present

## 2018-11-01 DIAGNOSIS — F17200 Nicotine dependence, unspecified, uncomplicated: Secondary | ICD-10-CM | POA: Diagnosis not present

## 2018-11-01 DIAGNOSIS — I499 Cardiac arrhythmia, unspecified: Secondary | ICD-10-CM

## 2018-11-01 DIAGNOSIS — N179 Acute kidney failure, unspecified: Secondary | ICD-10-CM | POA: Diagnosis present

## 2018-11-01 DIAGNOSIS — M5430 Sciatica, unspecified side: Secondary | ICD-10-CM | POA: Diagnosis present

## 2018-11-01 DIAGNOSIS — K219 Gastro-esophageal reflux disease without esophagitis: Secondary | ICD-10-CM | POA: Diagnosis present

## 2018-11-01 DIAGNOSIS — E878 Other disorders of electrolyte and fluid balance, not elsewhere classified: Secondary | ICD-10-CM | POA: Diagnosis present

## 2018-11-01 DIAGNOSIS — R451 Restlessness and agitation: Secondary | ICD-10-CM | POA: Diagnosis not present

## 2018-11-01 DIAGNOSIS — J690 Pneumonitis due to inhalation of food and vomit: Secondary | ICD-10-CM | POA: Diagnosis not present

## 2018-11-01 DIAGNOSIS — I248 Other forms of acute ischemic heart disease: Secondary | ICD-10-CM | POA: Diagnosis present

## 2018-11-01 DIAGNOSIS — R0602 Shortness of breath: Secondary | ICD-10-CM

## 2018-11-01 DIAGNOSIS — R402362 Coma scale, best motor response, obeys commands, at arrival to emergency department: Secondary | ICD-10-CM | POA: Diagnosis present

## 2018-11-01 DIAGNOSIS — I498 Other specified cardiac arrhythmias: Secondary | ICD-10-CM

## 2018-11-01 DIAGNOSIS — J80 Acute respiratory distress syndrome: Secondary | ICD-10-CM | POA: Diagnosis present

## 2018-11-01 DIAGNOSIS — J441 Chronic obstructive pulmonary disease with (acute) exacerbation: Secondary | ICD-10-CM

## 2018-11-01 DIAGNOSIS — Z01818 Encounter for other preprocedural examination: Secondary | ICD-10-CM

## 2018-11-01 DIAGNOSIS — C6992 Malignant neoplasm of unspecified site of left eye: Secondary | ICD-10-CM | POA: Diagnosis not present

## 2018-11-01 DIAGNOSIS — R0689 Other abnormalities of breathing: Secondary | ICD-10-CM | POA: Diagnosis not present

## 2018-11-01 DIAGNOSIS — R402142 Coma scale, eyes open, spontaneous, at arrival to emergency department: Secondary | ICD-10-CM | POA: Diagnosis present

## 2018-11-01 DIAGNOSIS — R739 Hyperglycemia, unspecified: Secondary | ICD-10-CM | POA: Diagnosis present

## 2018-11-01 DIAGNOSIS — Z8739 Personal history of other diseases of the musculoskeletal system and connective tissue: Secondary | ICD-10-CM | POA: Diagnosis not present

## 2018-11-01 DIAGNOSIS — R402242 Coma scale, best verbal response, confused conversation, at arrival to emergency department: Secondary | ICD-10-CM | POA: Diagnosis present

## 2018-11-01 DIAGNOSIS — C6932 Malignant neoplasm of left choroid: Secondary | ICD-10-CM | POA: Diagnosis not present

## 2018-11-01 DIAGNOSIS — R008 Other abnormalities of heart beat: Secondary | ICD-10-CM | POA: Diagnosis present

## 2018-11-01 DIAGNOSIS — Z8249 Family history of ischemic heart disease and other diseases of the circulatory system: Secondary | ICD-10-CM

## 2018-11-01 DIAGNOSIS — R059 Cough, unspecified: Secondary | ICD-10-CM

## 2018-11-01 DIAGNOSIS — R112 Nausea with vomiting, unspecified: Secondary | ICD-10-CM | POA: Diagnosis present

## 2018-11-01 DIAGNOSIS — M7061 Trochanteric bursitis, right hip: Secondary | ICD-10-CM | POA: Diagnosis not present

## 2018-11-01 DIAGNOSIS — Z781 Physical restraint status: Secondary | ICD-10-CM

## 2018-11-01 DIAGNOSIS — R509 Fever, unspecified: Secondary | ICD-10-CM | POA: Diagnosis not present

## 2018-11-01 LAB — CBC
HCT: 31.6 % — ABNORMAL LOW (ref 36.0–46.0)
HEMATOCRIT: 37.3 % (ref 36.0–46.0)
Hemoglobin: 12.8 g/dL (ref 12.0–15.0)
Hemoglobin: 10.5 g/dL — ABNORMAL LOW (ref 12.0–15.0)
MCH: 31.7 pg (ref 26.0–34.0)
MCH: 31.4 pg (ref 26.0–34.0)
MCHC: 34.3 g/dL (ref 30.0–36.0)
MCHC: 33.2 g/dL (ref 30.0–36.0)
MCV: 92.3 fL (ref 80.0–100.0)
MCV: 94.6 fL (ref 80.0–100.0)
Platelets: 168 10*3/uL (ref 150–400)
Platelets: 126 10*3/uL — ABNORMAL LOW (ref 150–400)
RBC: 4.04 MIL/uL (ref 3.87–5.11)
RBC: 3.34 MIL/uL — ABNORMAL LOW (ref 3.87–5.11)
RDW: 12.9 % (ref 11.5–15.5)
RDW: 13 % (ref 11.5–15.5)
WBC: 13.7 10*3/uL — AB (ref 4.0–10.5)
WBC: 9.1 10*3/uL (ref 4.0–10.5)
nRBC: 0 % (ref 0.0–0.2)
nRBC: 0 % (ref 0.0–0.2)

## 2018-11-01 LAB — BLOOD GAS, ARTERIAL
Acid-base deficit: 8.9 mmol/L — ABNORMAL HIGH (ref 0.0–2.0)
Bicarbonate: 19.8 mmol/L — ABNORMAL LOW (ref 20.0–28.0)
FIO2: 0.9
MECHVT: 450 mL
O2 Saturation: 91.8 %
PCO2 ART: 53 mmHg — AB (ref 32.0–48.0)
PEEP: 5 cmH2O
Patient temperature: 37
RATE: 16 resp/min
pH, Arterial: 7.18 — CL (ref 7.350–7.450)
pO2, Arterial: 79 mmHg — ABNORMAL LOW (ref 83.0–108.0)

## 2018-11-01 LAB — URINALYSIS, COMPLETE (UACMP) WITH MICROSCOPIC
BACTERIA UA: NONE SEEN
BILIRUBIN URINE: NEGATIVE
GLUCOSE, UA: NEGATIVE mg/dL
Ketones, ur: NEGATIVE mg/dL
Leukocytes,Ua: NEGATIVE
Nitrite: NEGATIVE
PH: 5 (ref 5.0–8.0)
Protein, ur: 30 mg/dL — AB
Specific Gravity, Urine: >1.046 — ABNORMAL HIGH (ref 1.005–1.030)

## 2018-11-01 LAB — TSH: TSH: 1.425 u[IU]/mL (ref 0.350–4.500)

## 2018-11-01 LAB — BASIC METABOLIC PANEL
ANION GAP: 10 (ref 5–15)
Anion gap: 11 (ref 5–15)
BUN: 25 mg/dL — ABNORMAL HIGH (ref 8–23)
BUN: 23 mg/dL (ref 8–23)
CO2: 16 mmol/L — ABNORMAL LOW (ref 22–32)
CO2: 18 mmol/L — ABNORMAL LOW (ref 22–32)
CREATININE: 0.89 mg/dL (ref 0.44–1.00)
Calcium: 7.2 mg/dL — ABNORMAL LOW (ref 8.9–10.3)
Calcium: 7.4 mg/dL — ABNORMAL LOW (ref 8.9–10.3)
Chloride: 103 mmol/L (ref 98–111)
Chloride: 103 mmol/L (ref 98–111)
Creatinine, Ser: 0.88 mg/dL (ref 0.44–1.00)
GFR calc Af Amer: >60 mL/min (ref >60)
GFR calc Af Amer: >60 mL/min (ref >60)
GFR calc non Af Amer: >60 mL/min (ref >60)
GFR calc non Af Amer: >60 mL/min (ref >60)
GLUCOSE: 128 mg/dL — AB (ref 70–99)
Glucose, Bld: 171 mg/dL — ABNORMAL HIGH (ref 70–99)
Potassium: 3.8 mmol/L (ref 3.5–5.1)
Potassium: 3.5 mmol/L (ref 3.5–5.1)
Sodium: 130 mmol/L — ABNORMAL LOW (ref 135–145)
Sodium: 131 mmol/L — ABNORMAL LOW (ref 135–145)

## 2018-11-01 LAB — LACTIC ACID, PLASMA
Lactic Acid, Venous: 1.9 mmol/L (ref 0.5–1.9)
Lactic Acid, Venous: 1.4 mmol/L (ref 0.5–1.9)

## 2018-11-01 LAB — COMPREHENSIVE METABOLIC PANEL
ALK PHOS: 50 U/L (ref 38–126)
ALT: 28 U/L (ref 0–44)
AST: 31 U/L (ref 15–41)
Albumin: 3.7 g/dL (ref 3.5–5.0)
Anion gap: 14 (ref 5–15)
BUN: 21 mg/dL (ref 8–23)
CHLORIDE: 94 mmol/L — AB (ref 98–111)
CO2: 21 mmol/L — AB (ref 22–32)
CREATININE: 1.04 mg/dL — AB (ref 0.44–1.00)
Calcium: 8.4 mg/dL — ABNORMAL LOW (ref 8.9–10.3)
GFR calc Af Amer: >60 mL/min (ref >60)
GFR calc non Af Amer: 57 mL/min — ABNORMAL LOW (ref >60)
Glucose, Bld: 164 mg/dL — ABNORMAL HIGH (ref 70–99)
Potassium: 3.7 mmol/L (ref 3.5–5.1)
Sodium: 129 mmol/L — ABNORMAL LOW (ref 135–145)
Total Bilirubin: 0.4 mg/dL (ref 0.3–1.2)
Total Protein: 6.7 g/dL (ref 6.5–8.1)

## 2018-11-01 LAB — PHOSPHORUS: Phosphorus: 3.5 mg/dL (ref 2.5–4.6)

## 2018-11-01 LAB — TROPONIN I
TROPONIN I: 0.04 ng/mL — AB (ref <0.03)
Troponin I: 0.03 ng/mL (ref <0.03)
Troponin I: 0.07 ng/mL (ref <0.03)

## 2018-11-01 LAB — URINE DRUG SCREEN, QUALITATIVE (ARMC ONLY)
AMPHETAMINES, UR SCREEN: NOT DETECTED
BENZODIAZEPINE, UR SCRN: NOT DETECTED
Barbiturates, Ur Screen: NOT DETECTED
COCAINE METABOLITE, UR ~~LOC~~: NOT DETECTED
Cannabinoid 50 Ng, Ur ~~LOC~~: NOT DETECTED
MDMA (ECSTASY) UR SCREEN: NOT DETECTED
METHADONE SCREEN, URINE: NOT DETECTED
OPIATE, UR SCREEN: NOT DETECTED
PHENCYCLIDINE (PCP) UR S: NOT DETECTED
Tricyclic, Ur Screen: NOT DETECTED

## 2018-11-01 LAB — MAGNESIUM
MAGNESIUM: 2 mg/dL (ref 1.7–2.4)
Magnesium: 2.1 mg/dL (ref 1.7–2.4)

## 2018-11-01 LAB — LIPASE, BLOOD: LIPASE: 17 U/L (ref 11–51)

## 2018-11-01 LAB — GLUCOSE, CAPILLARY: Glucose-Capillary: 114 mg/dL — ABNORMAL HIGH (ref 70–99)

## 2018-11-01 LAB — TRIGLYCERIDES: Triglycerides: 218 mg/dL — ABNORMAL HIGH (ref <150)

## 2018-11-01 MED ORDER — ORAL CARE MOUTH RINSE
15.0000 mL | OROMUCOSAL | Status: DC
  Administered 2018-11-01 – 2018-11-13 (×96): 15 mL via OROMUCOSAL

## 2018-11-01 MED ORDER — IPRATROPIUM-ALBUTEROL 20-100 MCG/ACT IN AERS
1.0000 | INHALATION_SPRAY | Freq: Four times a day (QID) | RESPIRATORY_TRACT | Status: DC
  Filled 2018-11-01: qty 4

## 2018-11-01 MED ORDER — MIDAZOLAM HCL (PF) 5 MG/ML IJ SOLN: 2.0000 mg | Freq: Once | INTRAMUSCULAR | Status: DC

## 2018-11-01 MED ORDER — STERILE WATER FOR INJECTION IJ SOLN
INTRAMUSCULAR | Status: AC
  Administered 2018-11-01: 20:00:00
  Filled 2018-11-01: qty 10

## 2018-11-01 MED ORDER — FENTANYL CITRATE (PF) 100 MCG/2ML IJ SOLN
INTRAMUSCULAR | Status: AC
  Filled 2018-11-01: qty 2

## 2018-11-01 MED ORDER — ORAL CARE MOUTH RINSE: 15.0000 mL | Freq: Two times a day (BID) | OROMUCOSAL | Status: DC

## 2018-11-01 MED ORDER — SODIUM CHLORIDE 0.9 % IV BOLUS
1000.0000 mL | Freq: Once | INTRAVENOUS | Status: AC
  Administered 2018-11-01: 1000 mL via INTRAVENOUS

## 2018-11-01 MED ORDER — CHLORHEXIDINE GLUCONATE 0.12% ORAL RINSE (MEDLINE KIT)
15.0000 mL | Freq: Two times a day (BID) | OROMUCOSAL | Status: DC
  Administered 2018-11-01 – 2018-11-13 (×24): 15 mL via OROMUCOSAL

## 2018-11-01 MED ORDER — MIDAZOLAM HCL 2 MG/2ML IJ SOLN
2.0000 mg | Freq: Once | INTRAMUSCULAR | Status: AC
  Administered 2018-11-01: 2 mg via INTRAVENOUS

## 2018-11-01 MED ORDER — VENLAFAXINE HCL 37.5 MG PO TABS
75.0000 mg | ORAL_TABLET | Freq: Two times a day (BID) | ORAL | Status: DC
  Administered 2018-11-02 – 2018-11-13 (×23): 75 mg via NASOGASTRIC
  Filled 2018-11-01 (×24): qty 2

## 2018-11-01 MED ORDER — NOREPINEPHRINE 4 MG/250ML-% IV SOLN
INTRAVENOUS | Status: AC
  Administered 2018-11-01
  Filled 2018-11-01: qty 250

## 2018-11-01 MED ORDER — POTASSIUM CHLORIDE 10 MEQ/100ML IV SOLN
10.0000 meq | INTRAVENOUS | Status: AC
  Administered 2018-11-02 (×2): 10 meq via INTRAVENOUS
  Filled 2018-11-01 (×2): qty 100

## 2018-11-01 MED ORDER — SODIUM CHLORIDE 0.9% FLUSH
3.0000 mL | Freq: Once | INTRAVENOUS | Status: AC
  Administered 2018-11-01: 3 mL via INTRAVENOUS

## 2018-11-01 MED ORDER — MAGNESIUM SULFATE 2 GM/50ML IV SOLN
2.0000 g | Freq: Once | INTRAVENOUS | Status: AC
  Administered 2018-11-01: 2 g via INTRAVENOUS
  Filled 2018-11-01: qty 50

## 2018-11-01 MED ORDER — ONDANSETRON HCL 4 MG/2ML IJ SOLN: 4.0000 mg | Freq: Four times a day (QID) | INTRAMUSCULAR | Status: DC | PRN

## 2018-11-01 MED ORDER — ACETAMINOPHEN 160 MG/5ML PO SOLN
650.0000 mg | Freq: Four times a day (QID) | ORAL | Status: DC | PRN
  Filled 2018-11-01: qty 20.3

## 2018-11-01 MED ORDER — ZOLPIDEM TARTRATE 5 MG PO TABS: 5.0000 mg | ORAL_TABLET | Freq: Every day | ORAL | Status: DC

## 2018-11-01 MED ORDER — DOXYCYCLINE HYCLATE 100 MG IV SOLR
200.0000 mg | Freq: Two times a day (BID) | INTRAVENOUS | Status: DC
  Administered 2018-11-02: 200 mg via INTRAVENOUS
  Filled 2018-11-01 (×3): qty 200

## 2018-11-01 MED ORDER — PANTOPRAZOLE SODIUM 40 MG PO TBEC: 40.0000 mg | DELAYED_RELEASE_TABLET | Freq: Two times a day (BID) | ORAL | Status: DC

## 2018-11-01 MED ORDER — ACETAMINOPHEN 325 MG PO TABS: 650.0000 mg | ORAL_TABLET | Freq: Four times a day (QID) | ORAL | Status: DC | PRN

## 2018-11-01 MED ORDER — FENTANYL CITRATE (PF) 100 MCG/2ML IJ SOLN
100.0000 ug | Freq: Once | INTRAMUSCULAR | Status: AC
  Administered 2018-11-01: 100 ug via INTRAVENOUS

## 2018-11-01 MED ORDER — PROMETHAZINE HCL 25 MG/ML IJ SOLN
12.5000 mg | Freq: Four times a day (QID) | INTRAMUSCULAR | Status: DC | PRN
  Administered 2018-11-01: 12.5 mg via INTRAVENOUS

## 2018-11-01 MED ORDER — VECURONIUM BROMIDE 10 MG IV SOLR
10.0000 mg | INTRAVENOUS | Status: DC | PRN
  Administered 2018-11-04 – 2018-11-06 (×7): 10 mg via INTRAVENOUS
  Filled 2018-11-01 (×8): qty 10

## 2018-11-01 MED ORDER — ONDANSETRON HCL 4 MG/2ML IJ SOLN
4.0000 mg | Freq: Once | INTRAMUSCULAR | Status: AC
  Administered 2018-11-01: 4 mg via INTRAVENOUS
  Filled 2018-11-01: qty 2

## 2018-11-01 MED ORDER — POLYETHYLENE GLYCOL 3350 17 G PO PACK
17.0000 g | PACK | Freq: Every day | ORAL | Status: DC | PRN
  Administered 2018-11-10: 17 g via ORAL
  Filled 2018-11-01: qty 1

## 2018-11-01 MED ORDER — VECURONIUM BROMIDE 10 MG IV SOLR
INTRAVENOUS | Status: AC
  Administered 2018-11-01: 20:00:00
  Filled 2018-11-01: qty 10

## 2018-11-01 MED ORDER — DEXMEDETOMIDINE HCL IN NACL 400 MCG/100ML IV SOLN: 0.4000 ug/kg/h | INTRAVENOUS | Status: AC

## 2018-11-01 MED ORDER — ACETAMINOPHEN 500 MG PO TABS: 1000.0000 mg | ORAL_TABLET | Freq: Three times a day (TID) | ORAL | Status: DC

## 2018-11-01 MED ORDER — STERILE WATER FOR INJECTION IJ SOLN
INTRAMUSCULAR | Status: AC
  Administered 2018-11-01: 23:00:00
  Filled 2018-11-01: qty 10

## 2018-11-01 MED ORDER — CALCIUM GLUCONATE-NACL 1-0.675 GM/50ML-% IV SOLN
1.0000 g | Freq: Once | INTRAVENOUS | Status: AC
  Administered 2018-11-01: 1000 mg via INTRAVENOUS
  Filled 2018-11-01 (×2): qty 50

## 2018-11-01 MED ORDER — VENLAFAXINE HCL ER 75 MG PO CP24: 150.0000 mg | ORAL_CAPSULE | Freq: Every evening | ORAL | Status: DC

## 2018-11-01 MED ORDER — FENTANYL 2500MCG IN NS 250ML (10MCG/ML) PREMIX INFUSION
0.0000 ug/h | INTRAVENOUS | Status: DC
  Administered 2018-11-01: 50 ug/h via INTRAVENOUS
  Administered 2018-11-02 (×3): 400 ug/h via INTRAVENOUS
  Administered 2018-11-02: 175 ug/h via INTRAVENOUS
  Administered 2018-11-03: 200 ug/h via INTRAVENOUS
  Administered 2018-11-03 – 2018-11-04 (×4): 400 ug/h via INTRAVENOUS
  Administered 2018-11-05 – 2018-11-06 (×6): 375 ug/h via INTRAVENOUS
  Administered 2018-11-06 – 2018-11-07 (×3): 250 ug/h via INTRAVENOUS
  Administered 2018-11-08: 375 ug/h via INTRAVENOUS
  Administered 2018-11-08: 250 ug/h via INTRAVENOUS
  Administered 2018-11-09: 350 ug/h via INTRAVENOUS
  Administered 2018-11-09: 375 ug/h via INTRAVENOUS
  Administered 2018-11-09: 400 ug/h via INTRAVENOUS
  Administered 2018-11-09: 350 ug/h via INTRAVENOUS
  Administered 2018-11-10 (×2): 375 ug/h via INTRAVENOUS
  Administered 2018-11-11: 400 ug/h via INTRAVENOUS
  Administered 2018-11-11: 100 ug/h via INTRAVENOUS
  Administered 2018-11-11 – 2018-11-13 (×6): 400 ug/h via INTRAVENOUS
  Administered 2018-11-13: 350 ug/h via INTRAVENOUS
  Filled 2018-11-01 (×40): qty 250

## 2018-11-01 MED ORDER — DEXMEDETOMIDINE HCL IN NACL 400 MCG/100ML IV SOLN
0.4000 ug/kg/h | INTRAVENOUS | Status: DC
  Administered 2018-11-01: 0.4 ug/kg/h via INTRAVENOUS
  Filled 2018-11-01: qty 100

## 2018-11-01 MED ORDER — PANTOPRAZOLE SODIUM 40 MG IV SOLR
40.0000 mg | INTRAVENOUS | Status: DC
  Administered 2018-11-01: 40 mg via INTRAVENOUS
  Filled 2018-11-01: qty 40

## 2018-11-01 MED ORDER — BUDESONIDE 0.5 MG/2ML IN SUSP
0.5000 mg | Freq: Two times a day (BID) | RESPIRATORY_TRACT | Status: DC
  Administered 2018-11-01 – 2018-11-11 (×20): 0.5 mg via RESPIRATORY_TRACT
  Filled 2018-11-01 (×20): qty 2

## 2018-11-01 MED ORDER — OXYCODONE HCL 5 MG PO TABS: 5.0000 mg | ORAL_TABLET | ORAL | Status: DC | PRN

## 2018-11-01 MED ORDER — PROPOFOL 1000 MG/100ML IV EMUL
INTRAVENOUS | Status: AC
  Filled 2018-11-01: qty 100

## 2018-11-01 MED ORDER — ACETAMINOPHEN 650 MG RE SUPP
650.0000 mg | Freq: Four times a day (QID) | RECTAL | Status: DC | PRN
  Administered 2018-11-02: 650 mg via RECTAL
  Filled 2018-11-01: qty 1

## 2018-11-01 MED ORDER — SODIUM BICARBONATE 8.4 % IV SOLN
100.0000 meq | Freq: Once | INTRAVENOUS | Status: AC
  Administered 2018-11-01: 50 meq via INTRAVENOUS
  Filled 2018-11-01: qty 50

## 2018-11-01 MED ORDER — FENTANYL CITRATE (PF) 100 MCG/2ML IJ SOLN
100.0000 ug | Freq: Once | INTRAMUSCULAR | Status: DC
  Filled 2018-11-01 (×2): qty 2

## 2018-11-01 MED ORDER — KETOROLAC TROMETHAMINE 30 MG/ML IJ SOLN
30.0000 mg | Freq: Once | INTRAMUSCULAR | Status: AC
  Administered 2018-11-01: 30 mg via INTRAVENOUS
  Filled 2018-11-01: qty 1

## 2018-11-01 MED ORDER — CHLORHEXIDINE GLUCONATE CLOTH 2 % EX PADS
6.0000 | MEDICATED_PAD | Freq: Every day | CUTANEOUS | Status: DC
  Administered 2018-11-01 – 2018-11-13 (×12): 6 via TOPICAL

## 2018-11-01 MED ORDER — MIDAZOLAM HCL 2 MG/2ML IJ SOLN
INTRAMUSCULAR | Status: AC
  Filled 2018-11-01: qty 2

## 2018-11-01 MED ORDER — MIDAZOLAM HCL 2 MG/2ML IJ SOLN
INTRAMUSCULAR | Status: AC
  Filled 2018-11-01: qty 4

## 2018-11-01 MED ORDER — SODIUM CHLORIDE 0.9 % IV SOLN
2.0000 g | INTRAVENOUS | Status: DC
  Administered 2018-11-01: 2 g via INTRAVENOUS
  Filled 2018-11-01: qty 2
  Filled 2018-11-01: qty 20

## 2018-11-01 MED ORDER — HEPARIN SODIUM (PORCINE) 5000 UNIT/ML IJ SOLN
5000.0000 [IU] | Freq: Three times a day (TID) | INTRAMUSCULAR | Status: DC
  Administered 2018-11-01 – 2018-11-02 (×2): 5000 [IU] via SUBCUTANEOUS
  Filled 2018-11-01 (×2): qty 1

## 2018-11-01 MED ORDER — ONDANSETRON HCL 4 MG/2ML IJ SOLN
4.0000 mg | Freq: Four times a day (QID) | INTRAMUSCULAR | Status: DC | PRN
  Administered 2018-11-18 (×2): 4 mg via INTRAVENOUS
  Filled 2018-11-01 (×2): qty 2

## 2018-11-01 MED ORDER — NOREPINEPHRINE 4 MG/250ML-% IV SOLN
0.0000 ug/min | INTRAVENOUS | Status: DC
  Administered 2018-11-01: 30 ug/min via INTRAVENOUS

## 2018-11-01 MED ORDER — ENOXAPARIN SODIUM 40 MG/0.4ML ~~LOC~~ SOLN: 40.0000 mg | SUBCUTANEOUS | Status: DC

## 2018-11-01 MED ORDER — PANTOPRAZOLE SODIUM 40 MG PO TBEC: 80.0000 mg | DELAYED_RELEASE_TABLET | Freq: Every day | ORAL | Status: DC

## 2018-11-01 MED ORDER — ONDANSETRON HCL 4 MG PO TABS: 4.0000 mg | ORAL_TABLET | Freq: Four times a day (QID) | ORAL | Status: DC | PRN

## 2018-11-01 MED ORDER — PROPOFOL 1000 MG/100ML IV EMUL
0.0000 ug/kg/min | INTRAVENOUS | Status: DC
  Administered 2018-11-01: 30 ug/kg/min via INTRAVENOUS
  Administered 2018-11-04 (×2): 50 ug/kg/min via INTRAVENOUS
  Administered 2018-11-04: 40 ug/kg/min via INTRAVENOUS
  Administered 2018-11-05 (×2): 45 ug/kg/min via INTRAVENOUS
  Filled 2018-11-01 (×6): qty 100

## 2018-11-01 MED ORDER — MIDAZOLAM HCL 2 MG/2ML IJ SOLN
2.0000 mg | Freq: Once | INTRAMUSCULAR | Status: AC
  Administered 2018-11-04: 10:00:00 2 mg via INTRAVENOUS
  Filled 2018-11-01 (×2): qty 2

## 2018-11-01 MED ORDER — IPRATROPIUM-ALBUTEROL 0.5-2.5 (3) MG/3ML IN SOLN
3.0000 mL | Freq: Four times a day (QID) | RESPIRATORY_TRACT | Status: DC
  Administered 2018-11-01 – 2018-11-21 (×78): 3 mL via RESPIRATORY_TRACT
  Filled 2018-11-01 (×36): qty 3
  Filled 2018-11-01: qty 9
  Filled 2018-11-01 (×18): qty 3
  Filled 2018-11-01: qty 33
  Filled 2018-11-01 (×24): qty 3

## 2018-11-01 MED ORDER — PHENYLEPHRINE HCL-NACL 10-0.9 MG/250ML-% IV SOLN
0.0000 ug/min | INTRAVENOUS | Status: DC
  Administered 2018-11-01: 50 ug/min via INTRAVENOUS
  Filled 2018-11-01 (×3): qty 250
  Filled 2018-11-01: qty 10

## 2018-11-01 MED ORDER — LORAZEPAM 1 MG PO TABS: 0.5000 mg | ORAL_TABLET | Freq: Three times a day (TID) | ORAL | Status: DC | PRN

## 2018-11-01 MED ORDER — SENNOSIDES 8.8 MG/5ML PO SYRP
5.0000 mL | ORAL_SOLUTION | Freq: Two times a day (BID) | ORAL | Status: DC | PRN
  Administered 2018-11-02: 5 mL
  Filled 2018-11-01 (×2): qty 5

## 2018-11-01 MED ORDER — DIAZEPAM 5 MG/ML IJ SOLN
5.0000 mg | Freq: Once | INTRAMUSCULAR | Status: AC
  Administered 2018-11-01: 5 mg via INTRAVENOUS
  Filled 2018-11-01: qty 2

## 2018-11-01 MED ORDER — SODIUM CHLORIDE 0.9 % IV SOLN
INTRAVENOUS | Status: DC
  Administered 2018-11-01: 18:00:00 via INTRAVENOUS

## 2018-11-01 MED ORDER — IOPAMIDOL (ISOVUE-370) INJECTION 76%
100.0000 mL | Freq: Once | INTRAVENOUS | Status: AC | PRN
  Administered 2018-11-01: 100 mL via INTRAVENOUS

## 2018-11-01 MED ORDER — BISACODYL 10 MG RE SUPP
10.0000 mg | Freq: Every day | RECTAL | Status: DC | PRN
  Administered 2018-11-09: 10 mg via RECTAL
  Filled 2018-11-01: qty 1

## 2018-11-01 MED ORDER — VECURONIUM BROMIDE 10 MG IV SOLR
10.0000 mg | Freq: Once | INTRAVENOUS | Status: AC
  Administered 2018-11-01: 10 mg via INTRAVENOUS
  Filled 2018-11-01: qty 10

## 2018-11-01 MED ORDER — VECURONIUM BROMIDE 10 MG IV SOLR
INTRAVENOUS | Status: AC
  Administered 2018-11-01: 10 mg
  Filled 2018-11-01: qty 10

## 2018-11-01 NOTE — ED Notes (Signed)
Pt once again fell asleep while this RN and Helene Kelp, RN at bedside to assess BP.

## 2018-11-01 NOTE — ED Notes (Signed)
Another nurse walked by room and found pt sitting at the foot of bed - she had pulled out one IV and blood noted in floor and on bilat pt arms - she is confused and refuses to follow directions

## 2018-11-01 NOTE — Consult Note (Signed)
[facility] Cardiology  CARDIOLOGY CONSULT NOTE  Patient ID: Cindy Morrison MRN: [id number] DOB/AGE: 1956/05/29 63 y.o.  Admit date: 11/01/2018 Referring Physician [facility] Primary Physician [facility] Primary Cardiologist  Reason for Consultation ventricular bigeminy  HPI: 64 year old female referred for evaluation of ventricular bigeminy.  Patient presents with 3-day history of nausea, vomiting, and oral malaise and body aches consistent with viral gastroenteritis.  Patient labs notable for hyponatremia with a sodium 129, and with 13,700.  Patient denies chest pain.  ECG revealed circular bigeminy at 164 bpm.  Review of systems complete and found to be negative unless listed above     Past Medical History:  Diagnosis Date  . Anxiety   . Cancer ([place])    choroid melanoma left eye  . GERD (gastroesophageal reflux disease)   . Psoriasis     Past Surgical History:  Procedure Laterality Date  . ABDOMINAL HYSTERECTOMY    . BICEPT TENODESIS Right 12/18/2017   Procedure: BICEPS TENODESIS;  Surgeon: Leim Fabry, MD;  Location: [facility] ORS;  Service: Orthopedics;  Laterality: Right;  . BRAIN TUMOR EXCISION  2007  . COLONOSCOPY    . ESOPHAGOGASTRODUODENOSCOPY    . EYE SURGERY  2012  . MOHS SURGERY  2018  . SHOULDER ARTHROSCOPY WITH OPEN ROTATOR CUFF REPAIR Right 12/18/2017   Procedure: SHOULDER ARTHROSCOPY WITH OPEN ROTATOR CUFF REPAIR,DISTAL CLAVICLE EXCISION,SUBACROMIAL DECOMPRESSION;  Surgeon: Leim Fabry, MD;  Location: [facility] ORS;  Service: Orthopedics;  Laterality: Right;    (Not in a hospital admission)  Social History   Socioeconomic History  . Marital status: Married    Spouse name: Not on file  . Number of children: Not on file  . Years of education: Not on file  . Highest education level: Not on file  Occupational History  . Not on file  Social Needs  . Financial resource strain: Not on file  . Food insecurity:    Worry: Not on file    Inability: Not on file  . Transportation  needs:    Medical: Not on file    Non-medical: Not on file  Tobacco Use  . Smoking status: Current Every Day Smoker    Packs/day: 1.00  . Smokeless tobacco: Never Used  Substance and Sexual Activity  . Alcohol use: Yes    Frequency: Never    Comment: occasionally  . Drug use: Never  . Sexual activity: Not on file  Lifestyle  . Physical activity:    Days per week: Not on file    Minutes per session: Not on file  . Stress: Not on file  Relationships  . Social connections:    Talks on phone: Not on file    Gets together: Not on file    Attends religious service: Not on file    Active member of club or organization: Not on file    Attends meetings of clubs or organizations: Not on file    Relationship status: Not on file  . Intimate partner violence:    Fear of current or ex partner: Not on file    Emotionally abused: Not on file    Physically abused: Not on file    Forced sexual activity: Not on file  Other Topics Concern  . Not on file  Social History Narrative  . Not on file    Family History  Problem Relation Age of Onset  . Heart attack Father   . Lung cancer Father       Review of systems complete and found  to be negative unless listed above      PHYSICAL EXAM  General: Well developed, well nourished, in no acute distress HEENT:  Normocephalic and atramatic Neck:  No JVD.  Lungs: Clear bilaterally to auscultation and percussion. Heart: HRRR . Normal S1 and S2 without gallops or murmurs.  Abdomen: Bowel sounds are positive, abdomen soft and non-tender  Msk:  Back normal, normal gait. Normal strength and tone for age. Extremities: No clubbing, cyanosis or edema.   Neuro: Alert and oriented X 3. Psych:  Good affect, responds appropriately  Labs:   Lab Results  Component Value Date   WBC 13.7 (H) 11/01/2018   HGB 12.8 11/01/2018   HCT 37.3 11/01/2018   MCV 92.3 11/01/2018   PLT 168 11/01/2018    Recent Labs  Lab 11/01/18 0945  NA 129*  K 3.7   CL 94*  CO2 21*  BUN 21  CREATININE 1.04*  CALCIUM 8.4*  PROT 6.7  BILITOT 0.4  ALKPHOS 50  ALT 28  AST 31  GLUCOSE 164*   Lab Results  Component Value Date   TROPONINI 0.04 ([place]) 11/01/2018   No results found for: CHOL No results found for: HDL No results found for: LDLCALC No results found for: TRIG No results found for: CHOLHDL No results found for: LDLDIRECT    Radiology: Dg Chest Port 1 View  Result Date: 11/01/2018 CLINICAL DATA:  Cough. EXAM: PORTABLE CHEST 1 VIEW COMPARISON:  Radiographs of November 27, 2010. FINDINGS: The heart size and mediastinal contours are within normal limits. No pneumothorax or pleural effusion is noted. Right lung is clear. Minimal left basilar subsegmental atelectasis or infiltrate is noted. The visualized skeletal structures are unremarkable. IMPRESSION: Minimal left basilar subsegmental atelectasis or infiltrate. Electronically Signed   By: Marijo Conception, M.D.   On: 11/01/2018 10:35   Ct Angio Chest/abd/pel For Dissection W And/or Wo Contrast  Result Date: 11/01/2018 CLINICAL DATA:  Back pain. EXAM: CT ANGIOGRAPHY CHEST, ABDOMEN AND PELVIS TECHNIQUE: Multidetector CT imaging through the chest, abdomen and pelvis was performed using the standard protocol during bolus administration of intravenous contrast. Multiplanar reconstructed images and MIPs were obtained and reviewed to evaluate the vascular anatomy. CONTRAST:  193mL ISOVUE-370 IOPAMIDOL (ISOVUE-370) INJECTION 76% COMPARISON:  CT scan of September 01, 2017. FINDINGS: CTA CHEST FINDINGS Cardiovascular: Preferential opacification of the thoracic aorta. No evidence of thoracic aortic aneurysm or dissection. Normal heart size. No pericardial effusion. Mediastinum/Nodes: Small sliding-type hiatal hernia is noted. Thyroid gland is unremarkable. No significant adenopathy is noted. Lungs/Pleura: No pneumothorax or pleural effusion is noted. Mild bilateral posterior basilar subsegmental atelectasis is  noted. Mild left lingular subsegmental atelectasis is noted. Musculoskeletal: No chest wall abnormality. No acute or significant osseous findings. Review of the MIP images confirms the above findings. CTA ABDOMEN AND PELVIS FINDINGS VASCULAR Aorta: Atherosclerosis of abdominal aorta is noted without aneurysm or dissection. Celiac: Patent without evidence of aneurysm, dissection, vasculitis or significant stenosis. SMA: Patent without evidence of aneurysm, dissection, vasculitis or significant stenosis. Renals: Both renal arteries are patent without evidence of aneurysm, dissection, vasculitis, fibromuscular dysplasia or significant stenosis. IMA: Patent without evidence of aneurysm, dissection, vasculitis or significant stenosis. Inflow: Patent without evidence of aneurysm, dissection, vasculitis or significant stenosis. Veins: No obvious venous abnormality within the limitations of this arterial phase study. Review of the MIP images confirms the above findings. NON-VASCULAR Hepatobiliary: No focal liver abnormality is seen. No gallstones, gallbladder wall thickening, or biliary dilatation. Pancreas: Unremarkable. No pancreatic ductal dilatation  or surrounding inflammatory changes. Spleen: Normal in size without focal abnormality. Adrenals/Urinary Tract: Stable left adrenal adenoma. Right adrenal gland appears normal. Stable exophytic right renal cyst is noted. No hydronephrosis or renal obstruction is noted. Urinary bladder is unremarkable. Stomach/Bowel: Stomach is within normal limits. Appendix appears normal. No evidence of bowel wall thickening, distention, or inflammatory changes. Lymphatic: No significant adenopathy is noted. Reproductive: Status post hysterectomy. No adnexal masses. Other: No abdominal wall hernia or abnormality. No abdominopelvic ascites. Musculoskeletal: No acute or significant osseous findings. Review of the MIP images confirms the above findings. IMPRESSION: No definite evidence of  thoracic or abdominal aortic dissection or aneurysm. No evidence of mesenteric or renal artery stenosis. Mild bibasilar subsegmental atelectasis is noted. Small sliding-type hiatal hernia. Stable left adrenal adenoma. Aortic Atherosclerosis (ICD10-I70.0). Electronically Signed   By: Marijo Conception, M.D.   On: 11/01/2018 11:53    EKG: Ventricular bigeminy at 164 bpm  ASSESSMENT AND PLAN:   1.  Ventricular bigeminy, in the setting of viral gastroenteritis, with apparent dehydration, with sodium of 129 2.  Nausea, vomiting, malaise with probable dehydration, secondary to viral gastroenteritis 3.  Hyponatremia  Recommendations  1.  Agree with overall current therapy 2.  IV fluid repletion 3.  Magnesium repletion 4.  Defer beta-blocker at this time 5.  No indication for temporary or permanent pacemaker at this time  Signed: Isaias Cowman MD,PhD, [facility] 11/01/2018, 1:38 PM

## 2018-11-01 NOTE — Progress Notes (Signed)
Patient getting increasingly agitated and tried to get out of bed at least three times. Patient also working harder to breathe. ICU MD notified. Receive new orders to intubate the patient. See MAR for meds ordered and given during sequence. ICU MD place ET tube and OG tube. ICU MD ordered chest and abdominal x-ray. Isolation orders changed from low risk droplet/contact to high risk airborne/contact due to intubation.   

## 2018-11-01 NOTE — Consult Note (Signed)
PHARMACY CONSULT NOTE - FOLLOW UP  Pharmacy Consult for Electrolyte Monitoring and Replacement   Recent Labs: Potassium (mmol/L)  Date Value  11/01/2018 3.5  03/16/2014 3.8   Magnesium (mg/dL)  Date Value  11/01/2018 2.0   Calcium (mg/dL)  Date Value  11/01/2018 7.4 (L)   Calcium, Total (mg/dL)  Date Value  03/16/2014 9.0   Albumin (g/dL)  Date Value  11/01/2018 3.7  03/16/2014 3.8   Phosphorus (mg/dL)  Date Value  11/01/2018 3.5   Sodium (mmol/L)  Date Value  11/01/2018 131 (L)  03/16/2014 136     Assessment: Pharmacy has been consulted to monitor electrolytes per consult.  Patient is very mildly hypokalemic with K = 3.5   Goal of Therapy:  Electrolytes wnl's  Plan:  Patient is receiving KCl 56meq IV x2  - will recheck electrolytes with am labs  Lu Duffel, PharmD, BCPS Clinical Pharmacist 11/01/2018 9:49 PM

## 2018-11-01 NOTE — Progress Notes (Signed)
EKG completed in room on patient monitor. Hard copy EKG printed and placed in Patient's chart.

## 2018-11-01 NOTE — ED Notes (Signed)
MD aware pt's BP 89/47.

## 2018-11-01 NOTE — ED Notes (Signed)
Pt bp 72/30 - repositioned pt and BP cuff and retook BP - results 90/45

## 2018-11-01 NOTE — Progress Notes (Signed)
Family Meeting Note  Advance Directive:yes  Today a meeting took place with the Patient.  Patient is able to participate.  The following clinical team members were present during this meeting:MD  The following were discussed:Patient's diagnosis: nausea, vomiting, bigeminy, Patient's progosis: Unable to determine and Goals for treatment: Full Code  Additional follow-up to be provided: prn  Time spent during discussion:20 minutes  Evette Doffing, MD

## 2018-11-01 NOTE — ED Notes (Signed)
Pt's EKG rate noted to drop to 54, BP 86/54, this RN to bedside, pt c/o feeling hot, noted to be diaphoretic again, pt placed in trendelenburg, BP 98/44 after position change. MD called to bedside. VORB for 2nd liter NS when 1st liter is finished.

## 2018-11-01 NOTE — ED Notes (Addendum)
BP 90/49 right arm

## 2018-11-01 NOTE — ED Notes (Signed)
Pt continues to be confused and repeatedly ask the same questions when aroused - she continues to go from state of talking/confused to sleeping/snoring

## 2018-11-01 NOTE — Progress Notes (Signed)
Patient has mottling on arms and legs. Agricultural consultant notified. ICU MD aware. No new orders given for this. Will continue to monitor.

## 2018-11-01 NOTE — ED Notes (Signed)
MD aware upon this RN arrival to bedside to draw lactic, pt noted to be extremely diaphoretic, O2 sats 90-91% on 2L. Pt's O2 turned up to 3L via [place], O2 now 95% on 3L, pt falling asleep while this RN at bedside, BP mildly hypotensive 96/52. Pt no longer noted to be diaphoretic upon this RN return to bedside. Pt awakens with mild verbal stimuli, also noted to be falling asleep while MD at bedside but opens eyes and responds when MD speaks to her.

## 2018-11-01 NOTE — ED Notes (Signed)
Report given to [facility]

## 2018-11-01 NOTE — Progress Notes (Signed)
COVID-19 DISASTER DECLARATION:   FULL CONTACT PHYSICAL EXAMINATION WAS NOT POSSIBLE DUE TO TREATMENT OF COVID-19  AND CONSERVATION OF PERSONAL PROTECTIVE EQUIPMENT, LIMITED EXAM FINDINGS INCLUDE-severe gastroenteritis    Patient assessed or the symptoms described in the history of present illness.  In the context of the Global COVID-19 pandemic, which necessitated consideration that the patient might be at risk for infection with the SARS-CoV-2 virus that causes COVID-19, Institutional protocols and algorithms that pertain to the evaluation of patients at risk for COVID-19 are in a state of rapid change based on information released by regulatory bodies including the CDC and federal and state organizations. These policies and algorithms were followed during the patient's care while in hospital.     Intractable nausea and vomiting- may be secondary to viral gastroenteritis. R/O COVID  -IV antiemetics -IV fluids -Clear liquid diet and advance as tolerated -UDS pending -Would obtain GI pathogen panel if patient develops diarrhea -recommended testing for COVID, as there are new reports coming out showing 1/5 patients present only with GI symptoms- will order COVID testing (no fevers, cough, or shortness of breath)   Hypotension- due to dehydration. No signs of sepsis/infection. -Received 2L NS bolus in the ED -Will give a 3rd bolus NS and continue IVFs -May need pressors if no improvement with volume resuscitation  Leukocytosis- likely due to dehydration and probable viral gastroenteritis.  CTA chest/abdomen/pelvis negative for any signs of infection. Lactic acid normal. -Check UA -Trend WBC  Elevated troponin- likely demand ischemia due to above. No active chest pain. -Trend troponins -Cardiology following  Hyponatremia/hypochloremia- likely due to nausea and vomiting -IV fluids -Recheck in the morning  AKI- creatinine has doubled from baseline.  Likely due to nausea,  vomiting, and dehydration -IV fluids -Recheck in the morning -Avoid nephrotoxic agents    Cindy Morrison, M.D.  [facility]  Medical Director [place] Director [facility] Cardio-Pulmonary Department

## 2018-11-01 NOTE — ED Notes (Signed)
Pt HR on zole is 122 but radial pulse 62  Pt goes from sleeping/snoring to asking what time it is - she is not oriented to place and time but is to person - pt is slow to respond MD aware If pt HR goes below 100 MD is considering pacing pt - pulled 4mg  of Versed if needed

## 2018-11-01 NOTE — Procedures (Signed)
Endotracheal Intubation: Patient required placement of an artificial airway secondary to Respiratory Failure  Consent: Emergent.   Hand washing performed prior to starting the procedure.   Medications administered for sedation prior to procedure:  Midazolam 4 mg IV,  Vecuronium 10 mg IV, Fentanyl 100 mcg IV.    A time out procedure was called and correct patient, name, & ID confirmed. Needed supplies and equipment were assembled and checked to include ETT, 10 ml syringe, Glidescope, Mac and Miller blades, suction, oxygen and bag mask valve, end tidal CO2 monitor.   Patient was positioned to align the mouth and pharynx to facilitate visualization of the glottis.   Heart rate, SpO2 and blood pressure was continuously monitored during the procedure. Pre-oxygenation was conducted prior to intubation and endotracheal tube was placed through the vocal cords into the trachea.     The artificial airway was placed under direct visualization via glidescope route using a 8.0 ETT on the first attempt.  ETT was secured at 23 cm mark.  Placement was confirmed by auscuitation of lungs with good breath sounds bilaterally and no stomach sounds.  Condensation was noted on endotracheal tube.   Pulse ox 98%.  CO2 detector in place with appropriate color change.   Complications: None .   Operator: Jessiah Wojnar.   Chest radiograph ordered and pending.   Comments: OGT placed via glidescope.  Jemiah Ellenburg David Esiquio Boesen, M.D.  [facility]  Medical Director ICU-[facility] [place] Medical Director [facility] Cardio-Pulmonary Department       

## 2018-11-01 NOTE — ED Notes (Signed)
ED TO INPATIENT HANDOFF REPORT  ED Nurse Name and Phone #: Helene Kelp #[phone]  S Name/Age/Gender Cindy Morrison 63 y.o. female Room/Bed: ED18A/ED18A  Code Status   Code Status: Not on file  Home/SNF/Other Home Patient oriented to: self and place Is this baseline? No   Triage Complete: Triage complete  Chief Complaint back pain  Triage Note Emesis x3 day, back pain    Allergies Allergies  Allergen Reactions  . Erythromycin Base Swelling    Erythromycin ophthalmic ointment    Level of Care/Admitting Diagnosis ED Disposition    ED Disposition Condition [facility] [place] [[id number]]  Level of Care: Stepdown [14]  Diagnosis: Intractable nausea and vomiting [301601]  Admitting Physician: Hyman Bible DODD [[id number]]  Attending Physician: Hyman Bible DODD [[id number]]  Estimated length of stay: past midnight tomorrow  Certification:: I certify this patient will need inpatient services for at least 2 midnights  PT Class (Do Not Modify): Inpatient [101]  PT Acc Code (Do Not Modify): Private [1]       B Medical/Surgery History Past Medical History:  Diagnosis Date  . Anxiety   . Cancer ([place])    choroid melanoma left eye  . GERD (gastroesophageal reflux disease)   . Psoriasis    Past Surgical History:  Procedure Laterality Date  . ABDOMINAL HYSTERECTOMY    . BICEPT TENODESIS Right 12/18/2017   Procedure: BICEPS TENODESIS;  Surgeon: Leim Fabry, MD;  Location: [facility] ORS;  Service: Orthopedics;  Laterality: Right;  . BRAIN TUMOR EXCISION  2007  . COLONOSCOPY    . ESOPHAGOGASTRODUODENOSCOPY    . EYE SURGERY  2012  . MOHS SURGERY  2018  . SHOULDER ARTHROSCOPY WITH OPEN ROTATOR CUFF REPAIR Right 12/18/2017   Procedure: SHOULDER ARTHROSCOPY WITH OPEN ROTATOR CUFF REPAIR,DISTAL CLAVICLE EXCISION,SUBACROMIAL DECOMPRESSION;  Surgeon: Leim Fabry, MD;  Location: [facility] ORS;  Service: Orthopedics;  Laterality: Right;     A IV  Location/Drains/Wounds Patient Lines/Drains/Airways Status   Active Line/Drains/Airways    Name:   Placement date:   Placement time:   Site:   Days:   Peripheral IV 11/01/18 Right Antecubital   11/01/18    0958    Antecubital   less than 1   Peripheral IV 11/01/18 Left Antecubital   11/01/18    1156    Antecubital   less than 1   Incision (Closed) 12/18/17 Shoulder   12/18/17    1103     318          Intake/Output Last 24 hours  Intake/Output Summary (Last 24 hours) at 11/01/2018 1416 Last data filed at 11/01/2018 1246 Gross per 24 hour  Intake 1000 ml  Output -  Net 1000 ml    Labs/Imaging Results for orders placed or performed during the hospital encounter of 11/01/18 (from the past 48 hour(s))  Lipase, blood     Status: None   Collection Time: 11/01/18  9:45 AM  Result Value Ref Range   Lipase 17 11 - 51 U/L    Comment: Performed at [facility], [address] [place], [place] [address]  Comprehensive metabolic panel     Status: Abnormal   Collection Time: 11/01/18  9:45 AM  Result Value Ref Range   Sodium 129 (L) 135 - 145 mmol/L   Potassium 3.7 3.5 - 5.1 mmol/L   Chloride 94 (L) 98 - 111 mmol/L   CO2 21 (L) 22 - 32 mmol/L   Glucose, Bld 164 (  H) 70 - 99 mg/dL   BUN 21 8 - 23 mg/dL   Creatinine, Ser 1.04 (H) 0.44 - 1.00 mg/dL   Calcium 8.4 (L) 8.9 - 10.3 mg/dL   Total Protein 6.7 6.5 - 8.1 g/dL   Albumin 3.7 3.5 - 5.0 g/dL   AST 31 15 - 41 U/L   ALT 28 0 - 44 U/L   Alkaline Phosphatase 50 38 - 126 U/L   Total Bilirubin 0.4 0.3 - 1.2 mg/dL   GFR calc non Af Amer 57 (L) >60 mL/min   GFR calc Af Amer >60 >60 mL/min   Anion gap 14 5 - 15    Comment: Performed at [facility], [address] [place], [place] [address]  CBC     Status: Abnormal   Collection Time: 11/01/18  9:45 AM  Result Value Ref Range   WBC 13.7 (H) 4.0 - 10.5 K/uL   RBC 4.04 3.87 - 5.11 MIL/uL   Hemoglobin 12.8 12.0 - 15.0 g/dL   HCT 37.3 36.0 - 46.0 %   MCV 92.3 80.0 -  100.0 fL   MCH 31.7 26.0 - 34.0 pg   MCHC 34.3 30.0 - 36.0 g/dL   RDW 12.9 11.5 - 15.5 %   Platelets 168 150 - 400 K/uL   nRBC 0.0 0.0 - 0.2 %    Comment: Performed at [facility], [address] [place], [place] [address]  Troponin I - Once     Status: Abnormal   Collection Time: 11/01/18  9:45 AM  Result Value Ref Range   Troponin I 0.04 (HH) <0.03 ng/mL    Comment: CRITICAL RESULT CALLED TO, READ BACK BY AND VERIFIED WITH Joeanthony Seeling @1113  11/01/18 AKT Performed at [facility], [address] [place], [place] [address]   Lactic acid, plasma     Status: None   Collection Time: 11/01/18 10:24 AM  Result Value Ref Range   Lactic Acid, Venous 1.9 0.5 - 1.9 mmol/L    Comment: Performed at [facility], [address] [place], [place] [address]   Dg Chest Port 1 View  Result Date: 11/01/2018 CLINICAL DATA:  Cough. EXAM: PORTABLE CHEST 1 VIEW COMPARISON:  Radiographs of November 27, 2010. FINDINGS: The heart size and mediastinal contours are within normal limits. No pneumothorax or pleural effusion is noted. Right lung is clear. Minimal left basilar subsegmental atelectasis or infiltrate is noted. The visualized skeletal structures are unremarkable. IMPRESSION: Minimal left basilar subsegmental atelectasis or infiltrate. Electronically Signed   By: Marijo Conception, M.D.   On: 11/01/2018 10:35   Ct Angio Chest/abd/pel For Dissection W And/or Wo Contrast  Result Date: 11/01/2018 CLINICAL DATA:  Back pain. EXAM: CT ANGIOGRAPHY CHEST, ABDOMEN AND PELVIS TECHNIQUE: Multidetector CT imaging through the chest, abdomen and pelvis was performed using the standard protocol during bolus administration of intravenous contrast. Multiplanar reconstructed images and MIPs were obtained and reviewed to evaluate the vascular anatomy. CONTRAST:  168mL ISOVUE-370 IOPAMIDOL (ISOVUE-370) INJECTION 76% COMPARISON:  CT scan of September 01, 2017. FINDINGS: CTA CHEST FINDINGS  Cardiovascular: Preferential opacification of the thoracic aorta. No evidence of thoracic aortic aneurysm or dissection. Normal heart size. No pericardial effusion. Mediastinum/Nodes: Small sliding-type hiatal hernia is noted. Thyroid gland is unremarkable. No significant adenopathy is noted. Lungs/Pleura: No pneumothorax or pleural effusion is noted. Mild bilateral posterior basilar subsegmental atelectasis is noted. Mild left lingular subsegmental atelectasis is noted. Musculoskeletal: No chest wall abnormality. No acute or significant osseous findings.  Review of the MIP images confirms the above findings. CTA ABDOMEN AND PELVIS FINDINGS VASCULAR Aorta: Atherosclerosis of abdominal aorta is noted without aneurysm or dissection. Celiac: Patent without evidence of aneurysm, dissection, vasculitis or significant stenosis. SMA: Patent without evidence of aneurysm, dissection, vasculitis or significant stenosis. Renals: Both renal arteries are patent without evidence of aneurysm, dissection, vasculitis, fibromuscular dysplasia or significant stenosis. IMA: Patent without evidence of aneurysm, dissection, vasculitis or significant stenosis. Inflow: Patent without evidence of aneurysm, dissection, vasculitis or significant stenosis. Veins: No obvious venous abnormality within the limitations of this arterial phase study. Review of the MIP images confirms the above findings. NON-VASCULAR Hepatobiliary: No focal liver abnormality is seen. No gallstones, gallbladder wall thickening, or biliary dilatation. Pancreas: Unremarkable. No pancreatic ductal dilatation or surrounding inflammatory changes. Spleen: Normal in size without focal abnormality. Adrenals/Urinary Tract: Stable left adrenal adenoma. Right adrenal gland appears normal. Stable exophytic right renal cyst is noted. No hydronephrosis or renal obstruction is noted. Urinary bladder is unremarkable. Stomach/Bowel: Stomach is within normal limits. Appendix appears  normal. No evidence of bowel wall thickening, distention, or inflammatory changes. Lymphatic: No significant adenopathy is noted. Reproductive: Status post hysterectomy. No adnexal masses. Other: No abdominal wall hernia or abnormality. No abdominopelvic ascites. Musculoskeletal: No acute or significant osseous findings. Review of the MIP images confirms the above findings. IMPRESSION: No definite evidence of thoracic or abdominal aortic dissection or aneurysm. No evidence of mesenteric or renal artery stenosis. Mild bibasilar subsegmental atelectasis is noted. Small sliding-type hiatal hernia. Stable left adrenal adenoma. Aortic Atherosclerosis (ICD10-I70.0). Electronically Signed   By: Marijo Conception, M.D.   On: 11/01/2018 11:53    Pending Labs Unresulted Labs (From admission, onward)    Start     Ordered   11/01/18 1052  Urine Drug Screen, Qualitative  Once,   STAT     11/01/18 1051   11/01/18 1017  Lactic acid, plasma  Now then every 2 hours,   STAT     11/01/18 1016   11/01/18 0932  Urinalysis, Complete w Microscopic  ONCE - STAT,   STAT     11/01/18 0932   Signed and Held  HIV antibody (Routine Testing)  Once,   R     Signed and Held   Signed and Held  Basic metabolic panel  Tomorrow morning,   R     Signed and Held   Visual merchandiser and Held  CBC  Tomorrow morning,   R     Signed and Held   Signed and Held  Troponin I - Now Then Q6H  Now then every 6 hours,   R     Signed and Held   Signed and Held  TSH  Once,   R     Signed and Held   Signed and Held  Hemoglobin A1c  Tomorrow morning,   R     Signed and Held   Signed and Held  Magnesium  Tomorrow morning,   R     Signed and Held          Vitals/Pain Today's Vitals   11/01/18 1215 11/01/18 1230 11/01/18 1245 11/01/18 1300  BP: (!) 105/51 (!) 80/49 (!) 94/59 (!) 72/38  Pulse: 90 64 (!) 57 (!) 58  Resp: (!) 23 17 20 18   Temp:      TempSrc:      SpO2: 98% 94% 98% 98%  Weight:      Height:      PainSc:  Isolation  Precautions No active isolations  Medications Medications  promethazine (PHENERGAN) injection 12.5 mg (12.5 mg Intravenous Given 11/01/18 1247)  magnesium sulfate IVPB 2 g 50 mL (2 g Intravenous New Bag/Given 11/01/18 1333)  sodium chloride 0.9 % bolus 1,000 mL (has no administration in time range)  sodium chloride flush (NS) 0.9 % injection 3 mL (3 mLs Intravenous Given 11/01/18 1039)  ketorolac (TORADOL) 30 MG/ML injection 30 mg (30 mg Intravenous Given 11/01/18 1005)  ondansetron (ZOFRAN) injection 4 mg (4 mg Intravenous Given 11/01/18 1005)  sodium chloride 0.9 % bolus 1,000 mL (0 mLs Intravenous Stopped 11/01/18 1108)  sodium chloride 0.9 % bolus 1,000 mL (0 mLs Intravenous Stopped 11/01/18 1246)  iopamidol (ISOVUE-370) 76 % injection 100 mL (100 mLs Intravenous Contrast Given 11/01/18 1125)    Mobility Walks at home but not at this time High fall risk at this time   Focused Assessments Cardiac Assessment Handoff:  Cardiac Rhythm: Other (Comment)(Vent Bigeminy) Lab Results  Component Value Date   TROPONINI 0.04 ([place]) 11/01/2018   No results found for: DDIMER Does the Patient currently have chest pain? No     R Recommendations: See Admitting Provider Note  Report given to:   Additional Notes: Pt is confused and fluctuates between talking and sleeping/snoring - unable to remember things she has said or things said to her - pt pulled IV out

## 2018-11-01 NOTE — ED Triage Notes (Signed)
Emesis x3 day, back pain

## 2018-11-01 NOTE — ED Provider Notes (Signed)
[facility] Emergency Department Provider Note ____________________________________________   First MD Initiated Contact with Patient 11/01/18 [phone]     (approximate)  I have reviewed the triage vital signs and the nursing notes.   HISTORY  Chief Complaint Emesis  Level 5 caveat: History of present illness limited due to poor historian  HPI Cindy Morrison is a 63 y.o. female with PMH as noted below who presents with multiple complaints.  She primarily reports nausea and vomiting since yesterday, as well as lower midline back pain.  In addition she states that she has had a productive cough, although she cannot state what comes up when she coughs.  She reports generalized malaise and some body aches.  She does not know if she is constipated or having looser than normal stools, and does not remember when she had her last bowel movement.  She denies any urinary symptoms.  She denies any travel outside of the state or specific high risk COVID-19 exposures.  Past Medical History:  Diagnosis Date  . Anxiety   . Cancer ([place])    choroid melanoma left eye  . GERD (gastroesophageal reflux disease)   . Psoriasis     There are no active problems to display for this patient.   Past Surgical History:  Procedure Laterality Date  . ABDOMINAL HYSTERECTOMY    . BICEPT TENODESIS Right 12/18/2017   Procedure: BICEPS TENODESIS;  Surgeon: Leim Fabry, MD;  Location: [facility] ORS;  Service: Orthopedics;  Laterality: Right;  . BRAIN TUMOR EXCISION  2007  . COLONOSCOPY    . ESOPHAGOGASTRODUODENOSCOPY    . EYE SURGERY  2012  . MOHS SURGERY  2018  . SHOULDER ARTHROSCOPY WITH OPEN ROTATOR CUFF REPAIR Right 12/18/2017   Procedure: SHOULDER ARTHROSCOPY WITH OPEN ROTATOR CUFF REPAIR,DISTAL CLAVICLE EXCISION,SUBACROMIAL DECOMPRESSION;  Surgeon: Leim Fabry, MD;  Location: [facility] ORS;  Service: Orthopedics;  Laterality: Right;    Prior to Admission medications   Medication Sig Start  Date End Date Taking? Authorizing Provider  acetaminophen (TYLENOL) 500 MG tablet Take 2 tablets (1,000 mg total) by mouth every 8 (eight) hours. 12/18/17 12/18/18  Leim Fabry, MD  betamethasone dipropionate (DIPROLENE) 0.05 % ointment Apply 1 application topically 2 (two) times daily as needed. For psoriasis 10/27/17   [provider]  Calcium Carb-Cholecalciferol (CALCIUM 600+D3 PO) Take 1 tablet by mouth 2 (two) times daily.    [provider]  cholecalciferol (VITAMIN D) 1000 units tablet Take 1,000 Units by mouth at bedtime.    [provider]  famotidine (PEPCID) 20 MG tablet Take 1 tablet (20 mg total) by mouth 2 (two) times daily. Patient not taking: Reported on 12/10/2017 09/02/17   Carrie Mew, MD  LORazepam (ATIVAN) 0.5 MG tablet Take 0.5 mg by mouth every 8 (eight) hours as needed for anxiety. 10/27/17   [provider]  Multiple Vitamin (MULTIVITAMIN WITH MINERALS) TABS tablet Take 1 tablet by mouth daily. Centrum Silver    [provider]  Omega-3 Fatty Acids (FISH OIL) 1200 MG CAPS Take 1,200 mg by mouth 2 (two) times daily.    [provider]  omeprazole (PRILOSEC) 40 MG capsule Take 40 mg by mouth daily before breakfast.    [provider]  ondansetron (ZOFRAN ODT) 4 MG disintegrating tablet Take 1 tablet (4 mg total) by mouth every 8 (eight) hours as needed for nausea or vomiting. 12/18/17   Leim Fabry, MD  oxyCODONE (ROXICODONE) 5 MG immediate release tablet Take 1-2 tablets (5-10  mg total) by mouth every 4 (four) hours as needed (pain). 12/18/17 12/18/18  Leim Fabry, MD  promethazine (PHENERGAN) 25 MG tablet Take 1 tablet (25 mg total) by mouth every 6 (six) hours as needed for nausea or vomiting. Patient not taking: Reported on 12/10/2017 09/02/17   Carrie Mew, MD  TURMERIC PO Take 1 capsule by mouth at bedtime.    [provider]  venlafaxine XR (EFFEXOR-XR) 150 MG 24 hr capsule Take 150 mg by mouth  every evening.    [provider]  vitamin B-12 (CYANOCOBALAMIN) 1000 MCG tablet Take 1,000 mcg by mouth daily.    [provider]  zolpidem (AMBIEN) 5 MG tablet Take 5 mg by mouth at bedtime. 11/07/17   [provider]    Allergies Erythromycin base  Family History  Problem Relation Age of Onset  . Heart attack Father   . Lung cancer Father     Social History Social History   Tobacco Use  . Smoking status: Current Every Day Smoker    Packs/day: 1.00  . Smokeless tobacco: Never Used  Substance Use Topics  . Alcohol use: Yes    Frequency: Never    Comment: occasionally  . Drug use: Never    Review of Systems Level 5 caveat: Review of systems limited due to poor historian Constitutional: Positive for malaise. Cardiovascular: Denies chest pain. Respiratory: Denies shortness of breath. Gastrointestinal: Positive for vomiting. Genitourinary: Negative for dysuria.  Musculoskeletal: Positive for back pain. Skin: Negative for rash.   ____________________________________________   PHYSICAL EXAM:  VITAL SIGNS: ED Triage Vitals [11/01/18 0929]  Enc Vitals Group     BP 105/67     Pulse Rate 72     Resp 18     Temp 98.3 F (36.8 C)     Temp Source Oral     SpO2 95 %     Weight 180 lb (81.6 kg)     Height 5\' 6"  (1.676 m)     Head Circumference      Peak Flow      Pain Score 5     Pain Loc      Pain Edu?      Excl. in [place]?     Constitutional: Alert and oriented.  Slightly uncomfortable appearing but in no acute distress. Eyes: Conjunctivae are normal.  EOMI. Head: Atraumatic. Nose: No congestion/rhinnorhea. Mouth/Throat: Mucous membranes are slightly dry.   Neck: Normal range of motion.  Cardiovascular: Normal rate, regular rhythm. Grossly normal heart sounds.  Good peripheral circulation. Respiratory: Normal respiratory effort.  No retractions. Lungs CTAB. Gastrointestinal: Soft and nontender. No distention.  Genitourinary: No flank  tenderness. Musculoskeletal: No lower extremity edema.  Extremities warm and well perfused.  No midline spinal tenderness. Neurologic:  Normal speech and language.  Motor intact in all extremities.  No gross focal neurologic deficits are appreciated.  Skin:  Skin is warm and dry. No rash noted. Psychiatric: Speech and behavior are normal.  ____________________________________________   LABS (all labs ordered are listed, but only abnormal results are displayed)  Labs Reviewed  COMPREHENSIVE METABOLIC PANEL - Abnormal; Notable for the following components:      Result Value   Sodium 129 (*)    Chloride 94 (*)    CO2 21 (*)    Glucose, Bld 164 (*)    Creatinine, Ser 1.04 (*)    Calcium 8.4 (*)    GFR calc non Af Amer 57 (*)    All other components within  normal limits  CBC - Abnormal; Notable for the following components:   WBC 13.7 (*)    All other components within normal limits  TROPONIN I - Abnormal; Notable for the following components:   Troponin I 0.04 (*)    All other components within normal limits  LIPASE, BLOOD  LACTIC ACID, PLASMA  URINALYSIS, COMPLETE (UACMP) WITH MICROSCOPIC  LACTIC ACID, PLASMA  URINE DRUG SCREEN, QUALITATIVE ([facility] ONLY)   ____________________________________________  EKG  ED ECG REPORT I, Arta Silence, the attending physician, personally viewed and interpreted this ECG.  Date: 11/01/2018 EKG Time: 1011 Rate: 164 Rhythm: Ventricular bigeminy QRS Axis: normal Intervals: normal ST/T Wave abnormalities: normal Narrative Interpretation: Ventricular bigeminy with no acute ischemic changes  ____________________________________________  RADIOLOGY  CXR: Left basilar atelectasis CT chest/abdomen/pelvis: No acute abnormality  ____________________________________________   PROCEDURES  Procedure(s) performed: No  Procedures  Critical Care performed: Yes  CRITICAL CARE Performed by: Arta Silence   Total critical care  time: 60 minutes  Critical care time was exclusive of separately billable procedures and treating other patients.  Critical care was necessary to treat or prevent imminent or life-threatening deterioration.  Critical care was time spent personally by me on the following activities: development of treatment plan with patient and/or surrogate as well as nursing, discussions with consultants, evaluation of patient's response to treatment, examination of patient, obtaining history from patient or surrogate, ordering and performing treatments and interventions, ordering and review of laboratory studies, ordering and review of radiographic studies, pulse oximetry and re-evaluation of patient's condition. ____________________________________________   INITIAL IMPRESSION / ASSESSMENT AND PLAN / ED COURSE  Pertinent labs & imaging results that were available during my care of the patient were reviewed by me and considered in my medical decision making (see chart for details).  63 year old female with PMH as noted above presents with multiple complaints over the last several days, primarily nausea and vomiting as well as lower back pain.  In addition the patient reports malaise, a mild cough, and body aches.  She is a very poor historian, answering many questions with "I don't know" and unable to give details about many of the questions asked her.  She interrupted me after about 3 questions and asked "do I have coronavirus?"  However, she is not confused or altered.  She simply appears to be tired and unwilling to engage with me on my more focused questions.  On exam she is slightly uncomfortable but relatively well-appearing and her vital signs are normal.  Her lungs are clear.  The abdomen is soft and nontender.  There is no midline spinal tenderness.  Neuro exam is nonfocal.  The differential is broad but includes viral syndrome, gastroenteritis, UTI, pyelonephritis, or kidney stone, or less likely  hepatobiliary cause.  I do not suspect cardiac etiology.  Overall the patient's presentation is not consistent with COVID-19.  She has no travel history or high risk exposures from what she is able to tell me, and given the predominance of GI symptoms and the lack of fever her presentation would be very atypical.  We will obtain chest x-ray, lab work-up, give fluids and some symptomatic treatment and reassess.  ----------------------------------------- 10:17 AM on 11/01/2018 -----------------------------------------  EKG shows a ventricular bigeminy with a rate of 164 although the patient's actual palpable pulse is 80.  I will add on troponin and lactic acid.   ----------------------------------------- 11:42 AM on 11/01/2018 -----------------------------------------  The patient's monitor heart rate is now around the 120s with a palpable  pulse of half this.  The patient has had repeated episodes of feeling hot and appearing diaphoretic and near syncopal, with a heart rate going down closer to 80-100.  Her blood pressure is somewhat lower.  She has gotten a 1 L bolus and is currently receiving a second liter.  Because of the bigeminy and the back pain and diaphoresis I became concerned for aortic dissection.  I ordered a CT dissection study.    Because the lactate is not elevated I am concerned that the patient's hypotension and near syncope are cardiogenic in origin, rather than caused by sepsis.  I contacted Dr. Chancy Milroy who is the cardiologist on call to discuss the EKG findings and clinical presentation.  He advised that if the patient was stable and potentially needed pacing he would be able to see her on Monday, however if she needed emergent pacing or other acute intervention that I should contact the STEMI physician.  ----------------------------------------- 12:13 PM on 11/01/2018 -----------------------------------------  I consulted Dr. Saralyn Pilar from cardiology.  He reviewed the EKG  and we discussed the case over the phone.  He advised that the EKG is consistent with bigeminy and even though the palpable pulse is bradycardic at times, he feels that the underlying rhythm would not be bradycardic and there is no role for pacing.  The patient's blood pressure is improved and she is still receiving fluids.  We have her on the pacing pads just in case.  Clinically she appears relatively comfortable and has no further pain.  She is not diaphoretic and has no respiratory distress.  This time the underlying etiology of her presentation is still somewhat unclear although we have ruled out aortic dissection and multiple other acute life-threatening etiologies.  The patient will need further observation, telemetry monitoring of her heart rate and rhythm, trending of her troponins.  I discussed the results of the work-up and the plan of care with the patient and she expressed agreement.  I signed the patient out to the hospitalist Dr. Brett Albino.  ____________________________________________   FINAL CLINICAL IMPRESSION(S) / ED DIAGNOSES  Final diagnoses:  Hypotension, unspecified hypotension type  Hypoxia  Bigeminy      NEW MEDICATIONS STARTED DURING THIS VISIT:  New Prescriptions   No medications on file     Note:  This document was prepared using Dragon voice recognition software and may include unintentional dictation errors.   Arta Silence, MD 11/01/18 1224

## 2018-11-01 NOTE — ED Notes (Signed)
Louie Casa (spouse) [phone]

## 2018-11-01 NOTE — ED Notes (Signed)
MD aware of patient's BP, states okay for CT, this RN and Megan RN to accompany pt with fluids running. Pt taken to CT 1.

## 2018-11-01 NOTE — ED Notes (Signed)
Pt returned from [place] with this RN and Helene Kelp, Therapist, sports. Pt tolerated well. Zoll Defib pads placed on patient at this time per Dr. Cherylann Banas.

## 2018-11-01 NOTE — ED Notes (Addendum)
BP 86/54 left arm

## 2018-11-01 NOTE — Consult Note (Addendum)
CRITICAL CARE NOTE  CC  follow up respiratory failure  SUBJECTIVE Patient remains critically ill Prognosis is guarded Critically ill emergently intubated    SIGNIFICANT EVENTS 3.29 admitted for severe sepsis 3.29 intubated 3.29 r/o COVID   REVIEW OF SYSTEMS  PATIENT IS UNABLE TO PROVIDE COMPLETE REVIEW OF SYSTEMS DUE TO SEVERE CRITICAL ILLNESS   PHYSICAL EXAMINATION:  GENERAL:critically ill appearing, +resp distress HEAD: Normocephalic, atraumatic.  EYES: Pupils equal, round, reactive to light.  No scleral icterus.  MOUTH: Moist mucosal membrane. NECK: Supple. No thyromegaly. No nodules. No JVD.  PULMONARY: +rhonchi, +wheezing CARDIOVASCULAR: S1 and S2. Regular rate and rhythm. No murmurs, rubs, or gallops.  GASTROINTESTINAL: Soft, nontender, -distended. No masses. Positive bowel sounds. No hepatosplenomegaly.  MUSCULOSKELETAL: No swelling, clubbing, or edema.  NEUROLOGIC: obtunded, GCS<8 SKIN:intact,warm,dry      IMAGING    Dg Chest Port 1 View  Result Date: 11/01/2018 CLINICAL DATA:  Cough. EXAM: PORTABLE CHEST 1 VIEW COMPARISON:  Radiographs of November 27, 2010. FINDINGS: The heart size and mediastinal contours are within normal limits. No pneumothorax or pleural effusion is noted. Right lung is clear. Minimal left basilar subsegmental atelectasis or infiltrate is noted. The visualized skeletal structures are unremarkable. IMPRESSION: Minimal left basilar subsegmental atelectasis or infiltrate. Electronically Signed   By: Marijo Conception, M.D.   On: 11/01/2018 10:35   Ct Angio Chest/abd/pel For Dissection W And/or Wo Contrast  Result Date: 11/01/2018 CLINICAL DATA:  Back pain. EXAM: CT ANGIOGRAPHY CHEST, ABDOMEN AND PELVIS TECHNIQUE: Multidetector CT imaging through the chest, abdomen and pelvis was performed using the standard protocol during bolus administration of intravenous contrast. Multiplanar reconstructed images and MIPs were obtained and reviewed to  evaluate the vascular anatomy. CONTRAST:  143m ISOVUE-370 IOPAMIDOL (ISOVUE-370) INJECTION 76% COMPARISON:  CT scan of September 01, 2017. FINDINGS: CTA CHEST FINDINGS Cardiovascular: Preferential opacification of the thoracic aorta. No evidence of thoracic aortic aneurysm or dissection. Normal heart size. No pericardial effusion. Mediastinum/Nodes: Small sliding-type hiatal hernia is noted. Thyroid gland is unremarkable. No significant adenopathy is noted. Lungs/Pleura: No pneumothorax or pleural effusion is noted. Mild bilateral posterior basilar subsegmental atelectasis is noted. Mild left lingular subsegmental atelectasis is noted. Musculoskeletal: No chest wall abnormality. No acute or significant osseous findings. Review of the MIP images confirms the above findings. CTA ABDOMEN AND PELVIS FINDINGS VASCULAR Aorta: Atherosclerosis of abdominal aorta is noted without aneurysm or dissection. Celiac: Patent without evidence of aneurysm, dissection, vasculitis or significant stenosis. SMA: Patent without evidence of aneurysm, dissection, vasculitis or significant stenosis. Renals: Both renal arteries are patent without evidence of aneurysm, dissection, vasculitis, fibromuscular dysplasia or significant stenosis. IMA: Patent without evidence of aneurysm, dissection, vasculitis or significant stenosis. Inflow: Patent without evidence of aneurysm, dissection, vasculitis or significant stenosis. Veins: No obvious venous abnormality within the limitations of this arterial phase study. Review of the MIP images confirms the above findings. NON-VASCULAR Hepatobiliary: No focal liver abnormality is seen. No gallstones, gallbladder wall thickening, or biliary dilatation. Pancreas: Unremarkable. No pancreatic ductal dilatation or surrounding inflammatory changes. Spleen: Normal in size without focal abnormality. Adrenals/Urinary Tract: Stable left adrenal adenoma. Right adrenal gland appears normal. Stable exophytic right  renal cyst is noted. No hydronephrosis or renal obstruction is noted. Urinary bladder is unremarkable. Stomach/Bowel: Stomach is within normal limits. Appendix appears normal. No evidence of bowel wall thickening, distention, or inflammatory changes. Lymphatic: No significant adenopathy is noted. Reproductive: Status post hysterectomy. No adnexal masses. Other: No abdominal wall hernia or abnormality. No abdominopelvic ascites.  Musculoskeletal: No acute or significant osseous findings. Review of the MIP images confirms the above findings. IMPRESSION: No definite evidence of thoracic or abdominal aortic dissection or aneurysm. No evidence of mesenteric or renal artery stenosis. Mild bibasilar subsegmental atelectasis is noted. Small sliding-type hiatal hernia. Stable left adrenal adenoma. Aortic Atherosclerosis (ICD10-I70.0). Electronically Signed   By: Marijo Conception, M.D.   On: 11/01/2018 11:53      Indwelling Urinary Catheter continued, requirement due to   Reason to continue Indwelling Urinary Catheter for strict Intake/Output monitoring for hemodynamic instability         Ventilator continued, requirement due to, resp failure    Ventilator Sedation RASS 0 to -2     ASSESSMENT AND PLAN SYNOPSIS   Severe ACUTE Hypoxic and Hypercapnic Respiratory Failure From pneumonia and septic shock -continue Full MV support -continue Bronchodilator Therapy -Wean Fio2 and PEEP as tolerated -will perform SAT/SBT when respiratory parameters are met    SEVERE COPD EXACERBATION -continue IV steroids as prescribed -continue NEB THERAPY as prescribed -morphine as needed -wean fio2 as needed and tolerated   Renal Failure- -follow chem 7 -follow UO -continue Foley Catheter-assess need daily   NEUROLOGY - intubated and sedated - minimal sedation to achieve a RASS goal: -1 Wake up assessment pending   Septic shock -use vasopressors to keep MAP>65 -follow ABG and LA -follow up  cultures -emperic ABX -consider stress dose steroids -aggressive IV fluid resuscitation  CARDIAC ICU monitoring  ID -continue IV abx as prescibed -follow up cultures  GI/Nutrition GI PROPHYLAXIS as indicated DIET-->TF's as tolerated Constipation protocol as indicated  ENDO - ICU hypoglycemic\Hyperglycemia protocol -check FSBS per protocol   ELECTROLYTES -follow labs as needed -replace as needed -pharmacy consultation and following   DVT/GI PRX ordered TRANSFUSIONS AS NEEDED MONITOR FSBS ASSESS the need for LABS as needed   Critical Care Time devoted to patient care services described in this note is 35 minutes.   Overall, patient is critically ill, prognosis is guarded.  Patient with Multiorgan failure and at high risk for cardiac arrest and death.    Corrin Parker, M.D.  [facility]  Medical Director [facility] Director [facility] Cardio-Pulmonary Department

## 2018-11-01 NOTE — H&P (Addendum)
[place] [facility] [place] NAME: Cindy Morrison    MR#:  [id number]  DATE OF BIRTH:  15-Feb-1956  DATE OF ADMISSION:  11/01/2018  PRIMARY CARE PHYSICIAN: Katheren Shams   REQUESTING/REFERRING PHYSICIAN: Arta Silence, MD  CHIEF COMPLAINT:   Chief Complaint  Patient presents with   Emesis    HISTORY OF PRESENT ILLNESS:  Cindy Morrison  is a 63 y.o. female with a known history of anxiety, hx melanoma, GERD who presented to the ED with intractable nausea, vomiting, and malaise over the last couple of days.  She also endorsed some midline low back pain.  She states she is "just not feeling well".  She denies any abdominal pain, diarrhea, or constipation.  No chest pain, shortness of breath, fevers, chills.  In the ED, she was noted to be in bigeminy which is new.  She was mildly hypotensive with blood pressures in the 90s/50s.  Labs are significant for sodium 129, chloride 94, WBC 13.4.  Troponin 0.04.  CTA chest/abdomen/pelvis was unremarkable.  Due to new bigeminy and intractable nausea and vomiting, hospitalists were called for admission.  PAST MEDICAL HISTORY:   Past Medical History:  Diagnosis Date   Anxiety    Cancer ([place])    choroid melanoma left eye   GERD (gastroesophageal reflux disease)    Psoriasis     PAST SURGICAL HISTORY:   Past Surgical History:  Procedure Laterality Date   ABDOMINAL HYSTERECTOMY     BICEPT TENODESIS Right 12/18/2017   Procedure: BICEPS TENODESIS;  Surgeon: Leim Fabry, MD;  Location: [facility] ORS;  Service: Orthopedics;  Laterality: Right;   BRAIN TUMOR EXCISION  2007   COLONOSCOPY     ESOPHAGOGASTRODUODENOSCOPY     EYE SURGERY  2012   MOHS SURGERY  2018   SHOULDER ARTHROSCOPY WITH OPEN ROTATOR CUFF REPAIR Right 12/18/2017   Procedure: SHOULDER ARTHROSCOPY WITH OPEN ROTATOR CUFF REPAIR,DISTAL CLAVICLE EXCISION,SUBACROMIAL DECOMPRESSION;  Surgeon: Leim Fabry, MD;  Location: [facility] ORS;   Service: Orthopedics;  Laterality: Right;    SOCIAL HISTORY:   Social History   Tobacco Use   Smoking status: Current Every Day Smoker    Packs/day: 1.00   Smokeless tobacco: Never Used  Substance Use Topics   Alcohol use: Yes    Frequency: Never    Comment: occasionally    FAMILY HISTORY:   Family History  Problem Relation Age of Onset   Heart attack Father    Lung cancer Father     DRUG ALLERGIES:   Allergies  Allergen Reactions   Erythromycin Base Swelling    Erythromycin ophthalmic ointment    REVIEW OF SYSTEMS:   Review of Systems  Constitutional: Positive for malaise/fatigue. Negative for chills and fever.  HENT: Negative for congestion and sore throat.   Eyes: Negative for blurred vision and double vision.  Respiratory: Negative for cough and shortness of breath.   Cardiovascular: Negative for chest pain and palpitations.  Gastrointestinal: Positive for nausea and vomiting. Negative for abdominal pain and diarrhea.  Genitourinary: Negative for dysuria and urgency.  Musculoskeletal: Negative for back pain and neck pain.  Neurological: Positive for weakness. Negative for dizziness, focal weakness and headaches.  Psychiatric/Behavioral: Negative for depression. The patient is not nervous/anxious.     MEDICATIONS AT HOME:   Prior to Admission medications   Medication Sig Start Date End Date Taking? Authorizing Provider  acetaminophen (TYLENOL) 500 MG tablet Take 2 tablets (1,000 mg total) by mouth every 8 (eight)  hours. 12/18/17 12/18/18  Leim Fabry, MD  betamethasone dipropionate (DIPROLENE) 0.05 % ointment Apply 1 application topically 2 (two) times daily as needed. For psoriasis 10/27/17   [provider]  Calcium Carb-Cholecalciferol (CALCIUM 600+D3 PO) Take 1 tablet by mouth 2 (two) times daily.    [provider]  cholecalciferol (VITAMIN D) 1000 units tablet Take 1,000 Units by mouth at bedtime.    [provider]    famotidine (PEPCID) 20 MG tablet Take 1 tablet (20 mg total) by mouth 2 (two) times daily. Patient not taking: Reported on 12/10/2017 09/02/17   Carrie Mew, MD  LORazepam (ATIVAN) 0.5 MG tablet Take 0.5 mg by mouth every 8 (eight) hours as needed for anxiety. 10/27/17   [provider]  Multiple Vitamin (MULTIVITAMIN WITH MINERALS) TABS tablet Take 1 tablet by mouth daily. Centrum Silver    [provider]  Omega-3 Fatty Acids (FISH OIL) 1200 MG CAPS Take 1,200 mg by mouth 2 (two) times daily.    [provider]  omeprazole (PRILOSEC) 40 MG capsule Take 40 mg by mouth daily before breakfast.    [provider]  ondansetron (ZOFRAN ODT) 4 MG disintegrating tablet Take 1 tablet (4 mg total) by mouth every 8 (eight) hours as needed for nausea or vomiting. 12/18/17   Leim Fabry, MD  oxyCODONE (ROXICODONE) 5 MG immediate release tablet Take 1-2 tablets (5-10 mg total) by mouth every 4 (four) hours as needed (pain). 12/18/17 12/18/18  Leim Fabry, MD  promethazine (PHENERGAN) 25 MG tablet Take 1 tablet (25 mg total) by mouth every 6 (six) hours as needed for nausea or vomiting. Patient not taking: Reported on 12/10/2017 09/02/17   Carrie Mew, MD  TURMERIC PO Take 1 capsule by mouth at bedtime.    [provider]  venlafaxine XR (EFFEXOR-XR) 150 MG 24 hr capsule Take 150 mg by mouth every evening.    [provider]  vitamin B-12 (CYANOCOBALAMIN) 1000 MCG tablet Take 1,000 mcg by mouth daily.    [provider]  zolpidem (AMBIEN) 5 MG tablet Take 5 mg by mouth at bedtime. 11/07/17   [provider]      VITAL SIGNS:  Blood pressure (!) 105/51, pulse 90, temperature 98.3 F (36.8 C), temperature source Oral, resp. rate (!) 23, height 5\' 6"  (1.676 m), weight 81.6 kg, SpO2 98 %.  PHYSICAL EXAMINATION:  Physical Exam  GENERAL:  63 y.o.-year-old patient lying in the bed with no acute distress.  Tired appearing but easily  arousable. EYES: Pupils equal, round, reactive to light and accommodation. No scleral icterus. Extraocular muscles intact.  HEENT: Head atraumatic, normocephalic. Oropharynx and nasopharynx clear. + Mildly dry mucous membranes. NECK:  Supple, no jugular venous distention. No thyroid enlargement, no tenderness.  LUNGS: Normal breath sounds bilaterally, no wheezing, rales,rhonchi or crepitation. No use of accessory muscles of respiration.  CARDIOVASCULAR: Tachycardic, regular rhythm, S1, S2 normal. No murmurs, rubs, or gallops.  ABDOMEN: Soft, nontender, nondistended. Bowel sounds present. No organomegaly or mass.  EXTREMITIES: No pedal edema, cyanosis, or clubbing.  NEUROLOGIC: Cranial nerves II through XII are intact. +global weakness. Sensation intact. Gait not checked.  PSYCHIATRIC: The patient is alert and oriented x 3.  SKIN: No obvious rash, lesion, or ulcer.   LABORATORY PANEL:   CBC Recent Labs  Lab 11/01/18 0945  WBC 13.7*  HGB 12.8  HCT 37.3  PLT 168   ------------------------------------------------------------------------------------------------------------------  Chemistries  Recent Labs  Lab 11/01/18 0945  NA 129*  K 3.7  CL 94*  CO2 21*  GLUCOSE 164*  BUN 21  CREATININE 1.04*  CALCIUM 8.4*  AST 31  ALT 28  ALKPHOS 50  BILITOT 0.4   ------------------------------------------------------------------------------------------------------------------  Cardiac Enzymes Recent Labs  Lab 11/01/18 0945  TROPONINI 0.04*   ------------------------------------------------------------------------------------------------------------------  RADIOLOGY:  Dg Chest Port 1 View  Result Date: 11/01/2018 CLINICAL DATA:  Cough. EXAM: PORTABLE CHEST 1 VIEW COMPARISON:  Radiographs of November 27, 2010. FINDINGS: The heart size and mediastinal contours are within normal limits. No pneumothorax or pleural effusion is noted. Right lung is clear. Minimal left basilar subsegmental  atelectasis or infiltrate is noted. The visualized skeletal structures are unremarkable. IMPRESSION: Minimal left basilar subsegmental atelectasis or infiltrate. Electronically Signed   By: Marijo Conception, M.D.   On: 11/01/2018 10:35   Ct Angio Chest/abd/pel For Dissection W And/or Wo Contrast  Result Date: 11/01/2018 CLINICAL DATA:  Back pain. EXAM: CT ANGIOGRAPHY CHEST, ABDOMEN AND PELVIS TECHNIQUE: Multidetector CT imaging through the chest, abdomen and pelvis was performed using the standard protocol during bolus administration of intravenous contrast. Multiplanar reconstructed images and MIPs were obtained and reviewed to evaluate the vascular anatomy. CONTRAST:  111mL ISOVUE-370 IOPAMIDOL (ISOVUE-370) INJECTION 76% COMPARISON:  CT scan of September 01, 2017. FINDINGS: CTA CHEST FINDINGS Cardiovascular: Preferential opacification of the thoracic aorta. No evidence of thoracic aortic aneurysm or dissection. Normal heart size. No pericardial effusion. Mediastinum/Nodes: Small sliding-type hiatal hernia is noted. Thyroid gland is unremarkable. No significant adenopathy is noted. Lungs/Pleura: No pneumothorax or pleural effusion is noted. Mild bilateral posterior basilar subsegmental atelectasis is noted. Mild left lingular subsegmental atelectasis is noted. Musculoskeletal: No chest wall abnormality. No acute or significant osseous findings. Review of the MIP images confirms the above findings. CTA ABDOMEN AND PELVIS FINDINGS VASCULAR Aorta: Atherosclerosis of abdominal aorta is noted without aneurysm or dissection. Celiac: Patent without evidence of aneurysm, dissection, vasculitis or significant stenosis. SMA: Patent without evidence of aneurysm, dissection, vasculitis or significant stenosis. Renals: Both renal arteries are patent without evidence of aneurysm, dissection, vasculitis, fibromuscular dysplasia or significant stenosis. IMA: Patent without evidence of aneurysm, dissection, vasculitis or  significant stenosis. Inflow: Patent without evidence of aneurysm, dissection, vasculitis or significant stenosis. Veins: No obvious venous abnormality within the limitations of this arterial phase study. Review of the MIP images confirms the above findings. NON-VASCULAR Hepatobiliary: No focal liver abnormality is seen. No gallstones, gallbladder wall thickening, or biliary dilatation. Pancreas: Unremarkable. No pancreatic ductal dilatation or surrounding inflammatory changes. Spleen: Normal in size without focal abnormality. Adrenals/Urinary Tract: Stable left adrenal adenoma. Right adrenal gland appears normal. Stable exophytic right renal cyst is noted. No hydronephrosis or renal obstruction is noted. Urinary bladder is unremarkable. Stomach/Bowel: Stomach is within normal limits. Appendix appears normal. No evidence of bowel wall thickening, distention, or inflammatory changes. Lymphatic: No significant adenopathy is noted. Reproductive: Status post hysterectomy. No adnexal masses. Other: No abdominal wall hernia or abnormality. No abdominopelvic ascites. Musculoskeletal: No acute or significant osseous findings. Review of the MIP images confirms the above findings. IMPRESSION: No definite evidence of thoracic or abdominal aortic dissection or aneurysm. No evidence of mesenteric or renal artery stenosis. Mild bibasilar subsegmental atelectasis is noted. Small sliding-type hiatal hernia. Stable left adrenal adenoma. Aortic Atherosclerosis (ICD10-I70.0). Electronically Signed   By: Marijo Conception, M.D.   On: 11/01/2018 11:53      IMPRESSION AND PLAN:   Intractable nausea and vomiting- may be secondary to viral gastroenteritis.  No history of diabetes  to suggest diabetic gastroparesis.  No known marijuana use.  Abdominal exam is benign. -IV antiemetics -IV fluids -Clear liquid diet and advance as tolerated -UDS pending -Would obtain GI pathogen panel if patient develops diarrhea -CCM recommended  testing for COVID, as there are new reports coming out showing 1/5 patients present only with GI symptoms- will order COVID testing (no fevers, cough, or shortness of breath)  New bigeminy- may be due to above [facility] Cardiology consulted -Will give a dose of IV magnesium now -Check TSH -Cardiac monitoring  Hypotension- due to dehydration. No signs of sepsis/infection. -Received 2L NS bolus in the ED -Will give a 3rd bolus NS and continue IVFs -May need pressors if no improvement with volume resuscitation  Leukocytosis- likely due to dehydration and probable viral gastroenteritis.  CTA chest/abdomen/pelvis negative for any signs of infection. Lactic acid normal. -Check UA -Trend WBC  Elevated troponin- likely demand ischemia due to above. No active chest pain. -Trend troponins -Cardiology following  Hyponatremia/hypochloremia- likely due to nausea and vomiting -IV fluids -Recheck in the morning  AKI- creatinine has doubled from baseline.  Likely due to nausea, vomiting, and dehydration -IV fluids -Recheck in the morning -Avoid nephrotoxic agents  Hyperglycemia- likely due to above. No history of diabetes. -Check A1c  Chronic anxiety- stable -Continue home Ativan, Effexor, Ambien  DVT prophylaxis- lovenox  All the records are reviewed and case discussed with ED provider. Management plans discussed with the patient, family and they are in agreement.  CODE STATUS: Full  TOTAL TIME TAKING CARE OF THIS PATIENT: 45 minutes.    Berna Spare Cindy Morrison M.D on 11/01/2018 at 12:50 PM  Between 7am to 6pm - Pager - [phone]  After 6pm go to [email or web address] - Proofreader  [employer]  Hospitalists  Office  [phone]  CC: Primary care physician; Katheren Shams   Note: This dictation was prepared with Dragon dictation along with smaller phrase technology. Any transcriptional errors that result from this process are unintentional.

## 2018-11-02 ENCOUNTER — Inpatient Hospital Stay
Admission: EM | Admit: 2018-11-02 | Discharge: 2018-11-02 | Disposition: A | Discharge status: Home / Self Care | Payer: Managed Care, Other (non HMO) | Source: Home / Self Care | Attending: Internal Medicine | Admitting: Internal Medicine

## 2018-11-02 ENCOUNTER — Inpatient Hospital Stay: Payer: Managed Care, Other (non HMO)

## 2018-11-02 DIAGNOSIS — A419 Sepsis, unspecified organism: Secondary | ICD-10-CM

## 2018-11-02 DIAGNOSIS — R6521 Severe sepsis with septic shock: Secondary | ICD-10-CM

## 2018-11-02 DIAGNOSIS — J9601 Acute respiratory failure with hypoxia: Secondary | ICD-10-CM

## 2018-11-02 LAB — BLOOD GAS, ARTERIAL
Acid-Base Excess: 6.1 mmol/L — ABNORMAL HIGH (ref 0.0–2.0)
Acid-base deficit: 10 mmol/L — ABNORMAL HIGH (ref 0.0–2.0)
Bicarbonate: 17.6 mmol/L — ABNORMAL LOW (ref 20.0–28.0)
Bicarbonate: 31.8 mmol/L — ABNORMAL HIGH (ref 20.0–28.0)
FIO2: 1
FIO2: 0.35
LHR: 20 {breaths}/min
MECHVT: 450 mL
MECHVT: 450 mL
Mechanical Rate: 20
O2 Saturation: 99.1 %
O2 Saturation: 97.6 %
PEEP: 5 cmH2O
PEEP: 5 cmH2O
PO2 ART: 96 mmHg (ref 83.0–108.0)
Patient temperature: 37
Patient temperature: 37
pCO2 arterial: 44 mmHg (ref 32.0–48.0)
pCO2 arterial: 49 mmHg — ABNORMAL HIGH (ref 32.0–48.0)
pH, Arterial: 7.21 — ABNORMAL LOW (ref 7.350–7.450)
pH, Arterial: 7.42 (ref 7.350–7.450)
pO2, Arterial: 159 mmHg — ABNORMAL HIGH (ref 83.0–108.0)

## 2018-11-02 LAB — BASIC METABOLIC PANEL
Anion gap: 7 (ref 5–15)
BUN: 25 mg/dL — ABNORMAL HIGH (ref 8–23)
CO2: 16 mmol/L — ABNORMAL LOW (ref 22–32)
Calcium: 7.2 mg/dL — ABNORMAL LOW (ref 8.9–10.3)
Chloride: 109 mmol/L (ref 98–111)
Creatinine, Ser: 1.29 mg/dL — ABNORMAL HIGH (ref 0.44–1.00)
GFR calc non Af Amer: 44 mL/min — ABNORMAL LOW (ref >60)
GFR, EST AFRICAN AMERICAN: 51 mL/min — AB (ref >60)
GLUCOSE: 162 mg/dL — AB (ref 70–99)
Potassium: 4.2 mmol/L (ref 3.5–5.1)
Sodium: 132 mmol/L — ABNORMAL LOW (ref 135–145)

## 2018-11-02 LAB — GLUCOSE, CAPILLARY
GLUCOSE-CAPILLARY: 127 mg/dL — AB (ref 70–99)
Glucose-Capillary: 124 mg/dL — ABNORMAL HIGH (ref 70–99)

## 2018-11-02 LAB — CBC
HCT: 36.4 % (ref 36.0–46.0)
Hemoglobin: 11.7 g/dL — ABNORMAL LOW (ref 12.0–15.0)
MCH: 31.5 pg (ref 26.0–34.0)
MCHC: 32.1 g/dL (ref 30.0–36.0)
MCV: 97.8 fL (ref 80.0–100.0)
Platelets: 136 10*3/uL — ABNORMAL LOW (ref 150–400)
RBC: 3.72 MIL/uL — ABNORMAL LOW (ref 3.87–5.11)
RDW: 13.6 % (ref 11.5–15.5)
WBC: 16.8 10*3/uL — ABNORMAL HIGH (ref 4.0–10.5)
nRBC: 0 % (ref 0.0–0.2)

## 2018-11-02 LAB — RESPIRATORY PANEL BY PCR
Adenovirus: NOT DETECTED
Bordetella pertussis: NOT DETECTED
CORONAVIRUS 229E-RVPPCR: NOT DETECTED
Chlamydophila pneumoniae: NOT DETECTED
Coronavirus HKU1: NOT DETECTED
Coronavirus NL63: NOT DETECTED
Coronavirus OC43: NOT DETECTED
Influenza A: NOT DETECTED
Influenza B: NOT DETECTED
METAPNEUMOVIRUS-RVPPCR: NOT DETECTED
MYCOPLASMA PNEUMONIAE-RVPPCR: NOT DETECTED
Parainfluenza Virus 1: NOT DETECTED
Parainfluenza Virus 2: NOT DETECTED
Parainfluenza Virus 3: NOT DETECTED
Parainfluenza Virus 4: NOT DETECTED
Respiratory Syncytial Virus: NOT DETECTED
Rhinovirus / Enterovirus: NOT DETECTED

## 2018-11-02 LAB — C-REACTIVE PROTEIN: CRP: 14.1 mg/dL — ABNORMAL HIGH (ref <1.0)

## 2018-11-02 LAB — PROCALCITONIN: Procalcitonin: 18.54 ng/mL

## 2018-11-02 LAB — HEMOGLOBIN A1C
Hgb A1c MFr Bld: 5.9 % — ABNORMAL HIGH (ref 4.8–5.6)
Mean Plasma Glucose: 122.63 mg/dL

## 2018-11-02 LAB — CORTISOL: Cortisol, Plasma: 11.8 ug/dL

## 2018-11-02 LAB — MAGNESIUM: Magnesium: 2.1 mg/dL (ref 1.7–2.4)

## 2018-11-02 LAB — MRSA PCR SCREENING: MRSA by PCR: NEGATIVE

## 2018-11-02 LAB — TROPONIN I: Troponin I: 0.07 ng/mL (ref <0.03)

## 2018-11-02 MED ORDER — SODIUM BICARBONATE 8.4 % IV SOLN
50.0000 meq | Freq: Once | INTRAVENOUS | Status: AC
  Administered 2018-11-02: 50 meq via INTRAVENOUS
  Filled 2018-11-02: qty 50

## 2018-11-02 MED ORDER — MIDAZOLAM HCL 2 MG/2ML IJ SOLN
INTRAMUSCULAR | Status: AC
  Administered 2018-11-02: 2 mg via INTRAVENOUS
  Filled 2018-11-02: qty 2

## 2018-11-02 MED ORDER — ENOXAPARIN SODIUM 40 MG/0.4ML ~~LOC~~ SOLN
40.0000 mg | SUBCUTANEOUS | Status: DC
  Administered 2018-11-02 – 2018-11-21 (×20): 40 mg via SUBCUTANEOUS
  Filled 2018-11-02 (×20): qty 0.4

## 2018-11-02 MED ORDER — DOXYCYCLINE HYCLATE 100 MG IV SOLR
200.0000 mg | Freq: Two times a day (BID) | INTRAVENOUS | Status: DC
  Administered 2018-11-02 – 2018-11-03 (×2): 200 mg via INTRAVENOUS
  Filled 2018-11-02 (×4): qty 200

## 2018-11-02 MED ORDER — PRO-STAT SUGAR FREE PO LIQD
30.0000 mL | Freq: Three times a day (TID) | ORAL | Status: DC
  Administered 2018-11-02 – 2018-11-05 (×10): 30 mL

## 2018-11-02 MED ORDER — FAMOTIDINE 20 MG PO TABS
20.0000 mg | ORAL_TABLET | Freq: Two times a day (BID) | ORAL | Status: DC
  Administered 2018-11-02 – 2018-11-05 (×7): 20 mg
  Filled 2018-11-02 (×6): qty 1

## 2018-11-02 MED ORDER — VITAL 1.5 CAL PO LIQD
1000.0000 mL | ORAL | Status: DC
  Administered 2018-11-02 – 2018-11-03 (×2): 1000 mL

## 2018-11-02 MED ORDER — VANCOMYCIN VARIABLE DOSE PER UNSTABLE RENAL FUNCTION (PHARMACIST DOSING): Status: DC

## 2018-11-02 MED ORDER — SODIUM CHLORIDE 0.9 % IV SOLN
2.0000 g | INTRAVENOUS | Status: DC
  Administered 2018-11-02: 2 g via INTRAVENOUS
  Filled 2018-11-02: qty 20

## 2018-11-02 MED ORDER — NOREPINEPHRINE 16 MG/250ML-% IV SOLN
0.0000 ug/min | INTRAVENOUS | Status: DC
  Administered 2018-11-02: 18 ug/min via INTRAVENOUS
  Administered 2018-11-03 – 2018-11-05 (×2): 2 ug/min via INTRAVENOUS
  Filled 2018-11-02 (×3): qty 250

## 2018-11-02 MED ORDER — FAMOTIDINE 20 MG PO TABS: 20.0000 mg | ORAL_TABLET | Freq: Two times a day (BID) | ORAL | Status: DC

## 2018-11-02 MED ORDER — STERILE WATER FOR INJECTION IV SOLN
INTRAVENOUS | Status: DC
  Administered 2018-11-02 – 2018-11-03 (×5): via INTRAVENOUS
  Filled 2018-11-02 (×8): qty 850

## 2018-11-02 MED ORDER — MIDAZOLAM HCL 2 MG/2ML IJ SOLN
2.0000 mg | Freq: Once | INTRAMUSCULAR | Status: AC
  Administered 2018-11-02: 2 mg via INTRAVENOUS

## 2018-11-02 MED ORDER — PHENYLEPHRINE HCL-NACL 10-0.9 MG/250ML-% IV SOLN
0.0000 ug/min | INTRAVENOUS | Status: AC
  Filled 2018-11-02: qty 250

## 2018-11-02 MED ORDER — DEXMEDETOMIDINE HCL IN NACL 400 MCG/100ML IV SOLN
0.4000 ug/kg/h | INTRAVENOUS | Status: AC
  Administered 2018-11-02 – 2018-11-06 (×20): 1.2 ug/kg/h via INTRAVENOUS
  Administered 2018-11-06: 1 ug/kg/h via INTRAVENOUS
  Administered 2018-11-06: 1.2 ug/kg/h via INTRAVENOUS
  Filled 2018-11-02 (×23): qty 100

## 2018-11-02 MED ORDER — ACETAMINOPHEN 650 MG RE SUPP: 650.0000 mg | Freq: Four times a day (QID) | RECTAL | Status: DC | PRN

## 2018-11-02 MED ORDER — MIDAZOLAM HCL 2 MG/2ML IJ SOLN
2.0000 mg | INTRAMUSCULAR | Status: DC | PRN
  Administered 2018-11-03 – 2018-11-06 (×12): 2 mg via INTRAVENOUS
  Filled 2018-11-02 (×17): qty 2

## 2018-11-02 MED ORDER — SODIUM CHLORIDE 0.9 % IV SOLN
1.0000 g | Freq: Two times a day (BID) | INTRAVENOUS | Status: DC
  Administered 2018-11-03: 1 g via INTRAVENOUS
  Filled 2018-11-02 (×2): qty 1

## 2018-11-02 MED ORDER — VITAL HIGH PROTEIN PO LIQD: 1000.0000 mL | ORAL | Status: DC

## 2018-11-02 MED ORDER — VANCOMYCIN HCL IN DEXTROSE 750-5 MG/150ML-% IV SOLN
750.0000 mg | Freq: Once | INTRAVENOUS | Status: DC
  Filled 2018-11-02: qty 150

## 2018-11-02 MED ORDER — ACETAMINOPHEN 325 MG PO TABS
650.0000 mg | ORAL_TABLET | Freq: Four times a day (QID) | ORAL | Status: DC | PRN
  Administered 2018-11-02 – 2018-11-09 (×7): 650 mg
  Filled 2018-11-02 (×6): qty 2

## 2018-11-02 NOTE — Procedures (Signed)
Central Venous Catheter Insertion Procedure Note Cindy Morrison [id number] April 25, 1956  Procedure: Insertion of Central Venous Catheter Indications: Assessment of intravascular volume, Drug and/or fluid administration and Frequent blood sampling  Procedure Details Consent: Unable to obtain consent because of emergent medical necessity. Time Out: Verified patient identification, verified procedure, site/side was marked, verified correct patient position, special equipment/implants available, medications/allergies/relevent history reviewed, required imaging and test results available.  Performed  Maximum sterile technique was used including antiseptics, cap, gloves, gown, hand hygiene, mask and sheet. Skin prep: Chlorhexidine; local anesthetic administered A antimicrobial bonded/coated triple lumen catheter was placed in the right femoral vein due to emergent situation using the Seldinger technique.  Evaluation Blood flow good Complications: No apparent complications Patient did tolerate procedure well. Chest X-ray ordered to verify placement.  CXR:Not indicated due to femoral location  Procedure performed under direct supervision of Dr.Kasa. Ultrasound utilized for realtime vessel cannulation  Patient is on COVID-19 precautions  Cindy Morrison 11/02/2018, 2:40 AM

## 2018-11-02 NOTE — TOC Initial Note (Signed)
Transition of Care [facility]) - Initial/Assessment Note    Patient Details  Name: Cindy Morrison MRN: [id number] Date of Birth: [birth year]  Transition of Care [facility]) CM/SW Contact:    Shelbie Hutching, RN Phone Number: 11/02/2018, 3:40 PM  Clinical Narrative:                 RNCM spoke with [name].  Patient is from home and lives with her husband.  Patient is independent at baseline, able to drive and walks about 8 miles a day counting steps with her fit bit.  Patient is a smoker.  Currently patient is in the ICU intubated and sedated.  Patient is current with her PCP.  RNCM will cont to follow patient progress and assess for any discharge needs.   Expected Discharge Plan: Home/Self Care Barriers to Discharge: Continued Medical Work up   Patient Goals and CMS Choice Patient states their goals for this hospitalization and ongoing recovery are:: Husband wants patient to get better and be able to go home- currently intubated and sedated      Expected Discharge Plan and Services Expected Discharge Plan: Home/Self Care   Discharge Planning Services: CM Consult   Living arrangements for the past 2 months: Single Family Home                          Prior Living Arrangements/Services Living arrangements for the past 2 months: Single Family Home Lives with:: Spouse Patient language and need for interpreter reviewed:: Yes Do you feel safe going back to the place where you live?: Yes      Need for Family Participation in Patient Care: Yes (Comment) Care giver support system in place?: Yes (comment)(husband and daughter)   Criminal Activity/Legal Involvement Pertinent to Current Situation/Hospitalization: No - Comment as needed  Activities of Daily Living      Permission Sought/Granted Permission sought to share information with : Family Supports Permission granted to share information with : Yes, Verbal Permission Granted        Permission granted to share info w  Relationship: Oldest daughter     Emotional Assessment Appearance:: Appears stated age Attitude/Demeanor/Rapport: Sedated Affect (typically observed): Unable to Assess   Alcohol / Substance Use: Tobacco Use Psych Involvement: No (comment)  Admission diagnosis:  Cough [R05] Bigeminy [I49.9] Hypoxia [R09.02] Hypotension, unspecified hypotension type [I95.9] Intractable nausea and vomiting [R11.2] Patient Active Problem List   Diagnosis Date Noted  . Septic shock ([place])   . Acute respiratory failure with hypoxia ([place])   . Intractable nausea and vomiting 11/01/2018   PCP:  Katheren Shams Pharmacy:   [facility] [phone] - [address], [address] [place] [address] Phone: [phone] Fax: [phone]     Social Determinants of Health (SDOH) Interventions    Readmission Risk Interventions No flowsheet data found.

## 2018-11-02 NOTE — Procedures (Signed)
Arterial Catheter Insertion Procedure Note Cindy Morrison [id number] 05-15-1956  Procedure: Insertion of Arterial Catheter  Indications: Blood pressure monitoring and Frequent blood sampling  Procedure Details Consent: Unable to obtain consent because of emergent medical necessity. Time Out: Verified patient identification, verified procedure, site/side was marked, verified correct patient position, special equipment/implants available, medications/allergies/relevent history reviewed, required imaging and test results available.  Performed  Maximum sterile technique was used including antiseptics, cap, gloves, gown, hand hygiene, mask and sheet. Skin prep: Chlorhexidine; local anesthetic administered 20 gauge catheter was inserted into right femoral artery using the Seldinger technique. ULTRASOUND GUIDANCE USED: YES Evaluation Blood flow good; BP tracing good. Complications: No apparent complications.  Patient is on COVID-19 precautions Procedure performed under direct supervision of Dr.Kasa. Ultrasound utilized for realtime vessel cannulation   Cindy Morrison 11/02/2018

## 2018-11-02 NOTE — Progress Notes (Signed)
*  PRELIMINARY RESULTS* Echocardiogram 2D Echocardiogram has been performed.  Sherrie Sport 11/02/2018, 2:48 PM

## 2018-11-02 NOTE — Progress Notes (Signed)
[facility] Cardiology  SUBJECTIVE: Patient sedated and intubated, unable to perform ROS.   Vitals:   11/02/18 0500 11/02/18 0600 11/02/18 0700 11/02/18 0800  BP: 111/68 118/78 123/80 112/71  Pulse: 72 67 66 67  Resp: 18 20 20 20   Temp: 98.6 F (37 C) 98.4 F (36.9 C) 98.2 F (36.8 C) 98.2 F (36.8 C)  TempSrc:  Rectal  Rectal  SpO2: 100% 100% 100% 100%  Weight:      Height:         Intake/Output Summary (Last 24 hours) at 11/02/2018 7867 Last data filed at 11/02/2018 6720 Gross per 24 hour  Intake 8507.31 ml  Output 2025 ml  Net 6482.31 ml      PHYSICAL EXAM  General: Critically ill appearing, intubated and sedated, in no acute distress HEENT:  Normocephalic and atramatic Neck:  No JVD.  Lungs: ventilator support  Heart: HRRR . Normal S1 and S2 without gallops or murmurs.  Abdomen: nondistended Msk:  No obvious deformity Neuro: sedated Psych:  sedated   LABS: Basic Metabolic Panel: Recent Labs    11/01/18 2043 11/02/18 0226  NA 131* 132*  K 3.5 4.2  CL 103 109  CO2 18* 16*  GLUCOSE 171* 162*  BUN 23 25*  CREATININE 0.88 1.29*  CALCIUM 7.4* 7.2*  MG 2.0 2.1  PHOS 3.5  --    Liver Function Tests: Recent Labs    11/01/18 0945  AST 31  ALT 28  ALKPHOS 50  BILITOT 0.4  PROT 6.7  ALBUMIN 3.7   Recent Labs    11/01/18 0945  LIPASE 17   CBC: Recent Labs    11/01/18 1757 11/02/18 0226  WBC 9.1 16.8*  HGB 10.5* 11.7*  HCT 31.6* 36.4  MCV 94.6 97.8  PLT 126* 136*   Cardiac Enzymes: Recent Labs    11/01/18 1525 11/01/18 2029 11/02/18 0226  TROPONINI 0.03* 0.07* 0.07*   BNP: Invalid input(s): POCBNP D-Dimer: No results for input(s): DDIMER in the last 72 hours. Hemoglobin A1C: Recent Labs    11/02/18 0226  HGBA1C 5.9*   Fasting Lipid Panel: Recent Labs    11/01/18 2029  TRIG 218*   Thyroid Function Tests: Recent Labs    11/01/18 1525  TSH 1.425   Anemia Panel: No results for input(s): VITAMINB12, FOLATE, FERRITIN, TIBC,  IRON, RETICCTPCT in the last 72 hours.  Dg Abd 1 View  Result Date: 11/01/2018 CLINICAL DATA:  Evaluate NG tube. EXAM: ABDOMEN - 1 VIEW COMPARISON:  None FINDINGS: The NG tube terminates in the distal stomach. IMPRESSION: The NG tube terminates in the distal stomach. No other abnormalities. Electronically Signed   By: Dorise Bullion III M.D   On: 11/01/2018 19:23   Dg Chest Port 1 View  Result Date: 11/01/2018 CLINICAL DATA:  Evaluate ETT EXAM: PORTABLE CHEST 1 VIEW COMPARISON:  November 01, 2018 FINDINGS: The ETT is in good position. The NG tube terminates below today's film. The cardiomediastinal silhouette is normal. Coarsened lung markings are identified, worse since the previous chest x-ray. No focal infiltrate. IMPRESSION: 1. The ETT is in good position. The NG tube terminates below today's film. 2. Increasingly coarsened lung markings may represent bronchitic change or developing atypical infection. Electronically Signed   By: Dorise Bullion III M.D   On: 11/01/2018 19:23   Dg Chest Port 1 View  Result Date: 11/01/2018 CLINICAL DATA:  Cough. EXAM: PORTABLE CHEST 1 VIEW COMPARISON:  Radiographs of November 27, 2010. FINDINGS: The heart size and mediastinal  contours are within normal limits. No pneumothorax or pleural effusion is noted. Right lung is clear. Minimal left basilar subsegmental atelectasis or infiltrate is noted. The visualized skeletal structures are unremarkable. IMPRESSION: Minimal left basilar subsegmental atelectasis or infiltrate. Electronically Signed   By: Marijo Conception, M.D.   On: 11/01/2018 10:35   Ct Angio Chest/abd/pel For Dissection W And/or Wo Contrast  Result Date: 11/01/2018 CLINICAL DATA:  Back pain. EXAM: CT ANGIOGRAPHY CHEST, ABDOMEN AND PELVIS TECHNIQUE: Multidetector CT imaging through the chest, abdomen and pelvis was performed using the standard protocol during bolus administration of intravenous contrast. Multiplanar reconstructed images and MIPs were obtained  and reviewed to evaluate the vascular anatomy. CONTRAST:  133mL ISOVUE-370 IOPAMIDOL (ISOVUE-370) INJECTION 76% COMPARISON:  CT scan of September 01, 2017. FINDINGS: CTA CHEST FINDINGS Cardiovascular: Preferential opacification of the thoracic aorta. No evidence of thoracic aortic aneurysm or dissection. Normal heart size. No pericardial effusion. Mediastinum/Nodes: Small sliding-type hiatal hernia is noted. Thyroid gland is unremarkable. No significant adenopathy is noted. Lungs/Pleura: No pneumothorax or pleural effusion is noted. Mild bilateral posterior basilar subsegmental atelectasis is noted. Mild left lingular subsegmental atelectasis is noted. Musculoskeletal: No chest wall abnormality. No acute or significant osseous findings. Review of the MIP images confirms the above findings. CTA ABDOMEN AND PELVIS FINDINGS VASCULAR Aorta: Atherosclerosis of abdominal aorta is noted without aneurysm or dissection. Celiac: Patent without evidence of aneurysm, dissection, vasculitis or significant stenosis. SMA: Patent without evidence of aneurysm, dissection, vasculitis or significant stenosis. Renals: Both renal arteries are patent without evidence of aneurysm, dissection, vasculitis, fibromuscular dysplasia or significant stenosis. IMA: Patent without evidence of aneurysm, dissection, vasculitis or significant stenosis. Inflow: Patent without evidence of aneurysm, dissection, vasculitis or significant stenosis. Veins: No obvious venous abnormality within the limitations of this arterial phase study. Review of the MIP images confirms the above findings. NON-VASCULAR Hepatobiliary: No focal liver abnormality is seen. No gallstones, gallbladder wall thickening, or biliary dilatation. Pancreas: Unremarkable. No pancreatic ductal dilatation or surrounding inflammatory changes. Spleen: Normal in size without focal abnormality. Adrenals/Urinary Tract: Stable left adrenal adenoma. Right adrenal gland appears normal. Stable  exophytic right renal cyst is noted. No hydronephrosis or renal obstruction is noted. Urinary bladder is unremarkable. Stomach/Bowel: Stomach is within normal limits. Appendix appears normal. No evidence of bowel wall thickening, distention, or inflammatory changes. Lymphatic: No significant adenopathy is noted. Reproductive: Status post hysterectomy. No adnexal masses. Other: No abdominal wall hernia or abnormality. No abdominopelvic ascites. Musculoskeletal: No acute or significant osseous findings. Review of the MIP images confirms the above findings. IMPRESSION: No definite evidence of thoracic or abdominal aortic dissection or aneurysm. No evidence of mesenteric or renal artery stenosis. Mild bibasilar subsegmental atelectasis is noted. Small sliding-type hiatal hernia. Stable left adrenal adenoma. Aortic Atherosclerosis (ICD10-I70.0). Electronically Signed   By: Marijo Conception, M.D.   On: 11/01/2018 11:53   [place] Ekg Site Rite  Result Date: 11/01/2018 If Site Rite image not attached, placement could not be confirmed due to current cardiac rhythm.   TELEMETRY: sinus rhythm, 70s  ASSESSMENT AND PLAN:  Active Problems:   Intractable nausea and vomiting   Septic shock ([facility])   Acute respiratory failure with hypoxia ([facility])    1. Ventricular bigeminy, in the setting of viral gastroenteritis, with initial sodium 129, septic shock and pneumonia. No recurrence. Currently in normal sinus rhythm.  2. Hyponatremia, sodium initially 129, improved to 132 3. Acute respiratory failure secondary to pneumonia and septic shock, currently on ventilator 4. Septic  shock, on vasopressors 5. COPD exacerbation  Recommendations: 1. Agree with current therapy 2. Electrolyte replacement per pharmacy 3. Continue cardiac monitoring 4. Defer beta blocker 5. No further cardiac diagnostics recommended at this time.    Sign off for now; call with any questions.  Clabe Seal, PA-C 11/02/2018 9:24 AM

## 2018-11-02 NOTE — Progress Notes (Addendum)
Pharmacy Electrolyte Monitoring Consult:  Pharmacy consulted to assist in monitoring and replacing electrolytes in this 63 y.o. female admitted on 11/01/2018 with Emesis   Labs:  Sodium (mmol/L)  Date Value  11/02/2018 132 (L)  03/16/2014 136   Potassium (mmol/L)  Date Value  11/02/2018 4.2  03/16/2014 3.8   Magnesium (mg/dL)  Date Value  11/02/2018 2.1   Phosphorus (mg/dL)  Date Value  11/01/2018 3.5   Calcium (mg/dL)  Date Value  11/02/2018 7.2 (L)   Calcium, Total (mg/dL)  Date Value  03/16/2014 9.0   Albumin (g/dL)  Date Value  11/01/2018 3.7  03/16/2014 3.8    Assessment/Plan: Patient previously ordered potassium phosphate 85mmol IV x 1. Will order additional potassium chloride 64mEq IV x 1.   Will replace for goal potassium ~ 4, goal magnesium ~ 2, and phosphate > 2.5.   Will obtain electrolytes with am labs.   Pharmacy will continue to monitor and adjust per consult.    Keonda Dow L 11/02/2018 5:38 PM

## 2018-11-02 NOTE — Progress Notes (Signed)
Pt was starting to wake up and reach for tube. This RN gave pt bolus of fentanyl without relief of patient grabbing at tube. This RN went into room and held patient's hands while Holli Humbles, RN went to talk with MD to get an order for soft wrist restraints. At this time pt was combative, attempting to pull ETT out, attempting to get out of bed, kicking at this RN. Another RN and RT came into room to assist this RN. During this time both PIVs pulled out. Holli Humbles received verbal order for 2mg  of versed IVP which was administered, at which time pt stopped becoming combative with staff. Bilateral soft wrist restraints applied by this RN. Sheets and gown changed.

## 2018-11-02 NOTE — Progress Notes (Signed)
CRITICAL CARE NOTE         SUBJECTIVE FINDINGS & SIGNIFICANT EVENTS   S/p aggitation with multiple attempts to self extubate, s/p precedex drip to decrease RASS -1 Overnight events-Tmax 102  Patient remains critically ill, decreased FiO2 from 80% this am to 50% at 11am then after aggressive BPH down to 35%. NGT -LIS,   Will ask dietary to evaluate for appropriate nourishment.   PAST MEDICAL HISTORY   Past Medical History:  Diagnosis Date   Anxiety    Cancer ([place])    choroid melanoma left eye   GERD (gastroesophageal reflux disease)    Psoriasis      SURGICAL HISTORY   Past Surgical History:  Procedure Laterality Date   ABDOMINAL HYSTERECTOMY     BICEPT TENODESIS Right 12/18/2017   Procedure: BICEPS TENODESIS;  Surgeon: Leim Fabry, MD;  Location: [facility];  Service: Orthopedics;  Laterality: Right;   BRAIN TUMOR EXCISION  2007   COLONOSCOPY     ESOPHAGOGASTRODUODENOSCOPY     EYE SURGERY  2012   MOHS SURGERY  2018   SHOULDER ARTHROSCOPY WITH OPEN ROTATOR CUFF REPAIR Right 12/18/2017   Procedure: SHOULDER ARTHROSCOPY WITH OPEN ROTATOR CUFF REPAIR,DISTAL CLAVICLE EXCISION,SUBACROMIAL DECOMPRESSION;  Surgeon: Leim Fabry, MD;  Location: [facility];  Service: Orthopedics;  Laterality: Right;     FAMILY HISTORY   Family History  Problem Relation Age of Onset   Heart attack Father    Lung cancer Father      SOCIAL HISTORY   Social History   Tobacco Use   Smoking status: Current Every Day Smoker    Packs/day: 1.00   Smokeless tobacco: Never Used  Substance Use Topics   Alcohol use: Yes    Frequency: Never    Comment: occasionally   Drug use: Never     MEDICATIONS   Current Medication:  Current Facility-Administered Medications:    acetaminophen (TYLENOL)  suppository 650 mg, 650 mg, Rectal, Q6H PRN, 650 mg at 11/02/18 0009 **OR** acetaminophen (TYLENOL) solution 650 mg, 650 mg, Per Tube, Q6H PRN, Tukov-Yual, Magdalene S, NP   bisacodyl (DULCOLAX) suppository 10 mg, 10 mg, Rectal, Daily PRN, Tukov-Yual, Magdalene S, NP   budesonide (PULMICORT) nebulizer solution 0.5 mg, 0.5 mg, Nebulization, BID, Tukov-Yual, Magdalene S, NP, 0.5 mg at 11/02/18 0815   cefTRIAXone (ROCEPHIN) 2 g in sodium chloride 0.9 % 100 mL IVPB, 2 g, Intravenous, Q24H, Kasa, Kurian, MD, Stopped at 11/01/18 1832   chlorhexidine gluconate (MEDLINE KIT) (PERIDEX) 0.12 % solution 15 mL, 15 mL, Mouth Rinse, BID, Kasa, Kurian, MD, 15 mL at 11/02/18 0802   Chlorhexidine Gluconate Cloth 2 % PADS 6 each, 6 each, Topical, Daily, Kasa, Kurian, MD, 6 each at 11/02/18 0801   doxycycline (VIBRAMYCIN) 200 mg in dextrose 5 % 250 mL IVPB, 200 mg, Intravenous, Q12H, Kasa, Kurian, MD, Stopped at 11/02/18 0709   fentaNYL (SUBLIMAZE) injection 100 mcg, 100 mcg, Intravenous, Once, Kasa, Kurian, MD   fentaNYL 2554mg in NS 2569m(1036mml) infusion-PREMIX, 0-400 mcg/hr, Intravenous, Continuous, Kasa, Kurian, MD, Last Rate: 25 mL/hr at 11/02/18 0806, 250 mcg/hr at 11/02/18 0806   heparin injection 5,000 Units, 5,000 Units, Subcutaneous, Q8H, Kasa, Kurian, MD, 5,000 Units at 11/02/18 0509   ipratropium-albuterol (DUONEB) 0.5-2.5 (3) MG/3ML nebulizer solution 3 mL, 3 mL, Nebulization, Q6H, Tukov-Yual, Magdalene S, NP, 3 mL at 11/02/18 0815   MEDLINE mouth rinse, 15 mL, Mouth Rinse, 10 times per day, KasFlora LippsD, 15 mL at 11/02/18 1036   midazolam (VERSED)  2 MG/2ML injection, , , ,    midazolam (VERSED) injection 2 mg, 2 mg, Intravenous, Once, Mayo, Pete Pelt, MD   norepinephrine (LEVOPHED) 16 mg in 255m premix infusion, 0-40 mcg/min, Intravenous, Titrated, Tukov-Yual, Magdalene S, NP, Last Rate: 11.25 mL/hr at 11/02/18 0806, 12 mcg/min at 11/02/18 0806   norepinephrine (LEVOPHED) '4mg'$  in  2587mpremix infusion, 0-40 mcg/min, Intravenous, Titrated, Tukov-Yual, Magdalene S, NP, Stopped at 11/02/18 0926   ondansetron (ZOFRAN) tablet 4 mg, 4 mg, Per NG tube, Q6H PRN **OR** ondansetron (ZOFRAN) injection 4 mg, 4 mg, Intravenous, Q6H PRN, Tukov-Yual, Magdalene S, NP   pantoprazole (PROTONIX) injection 40 mg, 40 mg, Intravenous, Q24H, Tukov-Yual, Magdalene S, NP, 40 mg at 11/01/18 2137   polyethylene glycol (MIRALAX / GLYCOLAX) packet 17 g, 17 g, Oral, Daily PRN, Mayo, KaPete PeltMD   promethazine (PHENERGAN) injection 12.5 mg, 12.5 mg, Intravenous, Q6H PRN, Mayo, KaPete PeltMD, 12.5 mg at 11/01/18 1247   propofol (DIPRIVAN) 1000 MG/100ML infusion, 0-50 mcg/kg/min, Intravenous, Continuous, Tukov-Yual, Magdalene S, NP, Last Rate: 4.74 mL/hr at 11/02/18 0806, 10 mcg/kg/min at 11/02/18 0806   sennosides (SENOKOT) 8.8 MG/5ML syrup 5 mL, 5 mL, Per Tube, BID PRN, Tukov-Yual, Magdalene S, NP   sodium bicarbonate 150 mEq in sterile water 1,000 mL infusion, , Intravenous, Continuous, Tukov-Yual, Magdalene S, NP, Last Rate: 150 mL/hr at 11/02/18 1042   vecuronium (NORCURON) injection 10 mg, 10 mg, Intravenous, Q1H PRN, Tukov-Yual, Magdalene S, NP   venlafaxine (EFFEXOR) tablet 75 mg, 75 mg, Per NG tube, BID WC, Tukov-Yual, Magdalene S, NP, 75 mg at 11/02/18 0802    ALLERGIES   Erythromycin base    REVIEW OF SYSTEMS     ROS unable to obtain due to RASS-1 on mechanical ventilation  PHYSICAL EXAMINATION   Vitals:   11/02/18 0800 11/02/18 0900  BP: 112/71 110/70  Pulse: 67 70  Resp: 20 20  Temp: 98.2 F (36.8 C) 98.1 F (36.7 C)  SpO2: 100% 99%    GENERAL:Rass-1 HEAD: Normocephalic, atraumatic.  EYES: Pupils equal, round, reactive to light.  No scleral icterus.  MOUTH: Moist mucosal membrane. NECK: Supple. No thyromegaly. No nodules. No JVD.  PULMONARY: bibasilar crackles CARDIOVASCULAR: S1 and S2. Regular rate and rhythm. No murmurs, rubs, or gallops.    GASTROINTESTINAL: Soft, nontender, non-distended. No masses. Positive bowel sounds. No hepatosplenomegaly.  MUSCULOSKELETAL: No swelling, clubbing, or edema.  NEUROLOGIC:sedated SKIN:intact,warm,dry   LABS AND IMAGING       LAB RESULTS: Recent Labs  Lab 11/01/18 1757 11/01/18 2043 11/02/18 0226  NA 130* 131* 132*  K 3.8 3.5 4.2  CL 103 103 109  CO2 16* 18* 16*  BUN 25* 23 25*  CREATININE 0.89 0.88 1.29*  GLUCOSE 128* 171* 162*   Recent Labs  Lab 11/01/18 0945 11/01/18 1757 11/02/18 0226  HGB 12.8 10.5* 11.7*  HCT 37.3 31.6* 36.4  WBC 13.7* 9.1 16.8*  PLT 168 126* 136*     IMAGING RESULTS: Dg Abd 1 View  Result Date: 11/01/2018 CLINICAL DATA:  Evaluate NG tube. EXAM: ABDOMEN - 1 VIEW COMPARISON:  None FINDINGS: The NG tube terminates in the distal stomach. IMPRESSION: The NG tube terminates in the distal stomach. No other abnormalities. Electronically Signed   By: DaDorise BullionII M.D   On: 11/01/2018 19:23   Dg Chest Port 1 View  Result Date: 11/01/2018 CLINICAL DATA:  Evaluate ETT EXAM: PORTABLE CHEST 1 VIEW COMPARISON:  November 01, 2018 FINDINGS: The ETT is in good position.  The NG tube terminates below today's film. The cardiomediastinal silhouette is normal. Coarsened lung markings are identified, worse since the previous chest x-ray. No focal infiltrate. IMPRESSION: 1. The ETT is in good position. The NG tube terminates below today's film. 2. Increasingly coarsened lung markings may represent bronchitic change or developing atypical infection. Electronically Signed   By: Dorise Bullion III M.D   On: 11/01/2018 19:23   Ct Angio Chest/abd/pel For Dissection W And/or Wo Contrast  Result Date: 11/01/2018 CLINICAL DATA:  Back pain. EXAM: CT ANGIOGRAPHY CHEST, ABDOMEN AND PELVIS TECHNIQUE: Multidetector CT imaging through the chest, abdomen and pelvis was performed using the standard protocol during bolus administration of intravenous contrast. Multiplanar  reconstructed images and MIPs were obtained and reviewed to evaluate the vascular anatomy. CONTRAST:  125m ISOVUE-370 IOPAMIDOL (ISOVUE-370) INJECTION 76% COMPARISON:  CT scan of September 01, 2017. FINDINGS: CTA CHEST FINDINGS Cardiovascular: Preferential opacification of the thoracic aorta. No evidence of thoracic aortic aneurysm or dissection. Normal heart size. No pericardial effusion. Mediastinum/Nodes: Small sliding-type hiatal hernia is noted. Thyroid gland is unremarkable. No significant adenopathy is noted. Lungs/Pleura: No pneumothorax or pleural effusion is noted. Mild bilateral posterior basilar subsegmental atelectasis is noted. Mild left lingular subsegmental atelectasis is noted. Musculoskeletal: No chest wall abnormality. No acute or significant osseous findings. Review of the MIP images confirms the above findings. CTA ABDOMEN AND PELVIS FINDINGS VASCULAR Aorta: Atherosclerosis of abdominal aorta is noted without aneurysm or dissection. Celiac: Patent without evidence of aneurysm, dissection, vasculitis or significant stenosis. SMA: Patent without evidence of aneurysm, dissection, vasculitis or significant stenosis. Renals: Both renal arteries are patent without evidence of aneurysm, dissection, vasculitis, fibromuscular dysplasia or significant stenosis. IMA: Patent without evidence of aneurysm, dissection, vasculitis or significant stenosis. Inflow: Patent without evidence of aneurysm, dissection, vasculitis or significant stenosis. Veins: No obvious venous abnormality within the limitations of this arterial phase study. Review of the MIP images confirms the above findings. NON-VASCULAR Hepatobiliary: No focal liver abnormality is seen. No gallstones, gallbladder wall thickening, or biliary dilatation. Pancreas: Unremarkable. No pancreatic ductal dilatation or surrounding inflammatory changes. Spleen: Normal in size without focal abnormality. Adrenals/Urinary Tract: Stable left adrenal adenoma.  Right adrenal gland appears normal. Stable exophytic right renal cyst is noted. No hydronephrosis or renal obstruction is noted. Urinary bladder is unremarkable. Stomach/Bowel: Stomach is within normal limits. Appendix appears normal. No evidence of bowel wall thickening, distention, or inflammatory changes. Lymphatic: No significant adenopathy is noted. Reproductive: Status post hysterectomy. No adnexal masses. Other: No abdominal wall hernia or abnormality. No abdominopelvic ascites. Musculoskeletal: No acute or significant osseous findings. Review of the MIP images confirms the above findings. IMPRESSION: No definite evidence of thoracic or abdominal aortic dissection or aneurysm. No evidence of mesenteric or renal artery stenosis. Mild bibasilar subsegmental atelectasis is noted. Small sliding-type hiatal hernia. Stable left adrenal adenoma. Aortic Atherosclerosis (ICD10-I70.0). Electronically Signed   By: JMarijo Conception M.D.   On: 11/01/2018 11:53   [place] Site Rite  Result Date: 11/01/2018 If Site Rite image not attached, placement could not be confirmed due to current cardiac rhythm.     ASSESSMENT AND PLAN    -Multidisciplinary rounds held today  Acute Hypoxic Respiratory Failure    -poss COVID r/o  -background of COPD  -will repeat ABG, CXR, CBC with diff for AM, cortisol, crp/PCT - will plan for SBT in am but will not extubate as cytokine storm occurs after day 5-8 so will need to at least wait until results  are negative or if positive will evaluate daily for readiness to liberate from MV -continue Full MV support -continue Bronchodilator Therapy -Wean Fio2 and PEEP as tolerated -will perform SAT/SBT when respiratory parameters are met    Acute gastroenteritis     -Possible COVID related - results pending   CARDIAC FAILURE- Demand ischemia with mild troponin elevation -Lasix as tolerated -follow up cardiac enzymes as indicated ICU monitoring  Renal Failure-most likely  due to GI losses/pre renal  -follow chem 7 -follow UO -continue Foley Catheter-assess need daily   NEUROLOGY - intubated and sedated - minimal sedation to achieve a RASS goal: -1 Wake up assessment pending   ID -continue IV abx as prescibed -follow up cultures  GI/Nutrition GI PROPHYLAXIS as indicated DIET-->TF's as tolerated Constipation protocol as indicated  ENDO - ICU hypoglycemic\Hyperglycemia protocol -check FSBS per protocol   ELECTROLYTES -follow labs as needed -replace as needed -pharmacy consultation   DVT/GI PRX ordered -SCDs  TRANSFUSIONS AS NEEDED MONITOR FSBS ASSESS the need for LABS as needed   Critical care provider statement:    Critical care time (minutes):  36   Critical care time was exclusive of:  Separately billable procedures and treating other patients   Critical care was necessary to treat or prevent imminent or life-threatening deterioration of the following conditions:  acute hypoxemic respiratory failure, COVID19 rule out, COPD, AKI, demand ischemia , acute gastroenteritis   Critical care was time spent personally by me on the following activities:  Development of treatment plan with patient or surrogate, discussions with consultants, evaluation of patient's response to treatment, examination of patient, obtaining history from patient or surrogate, ordering and performing treatments and interventions, ordering and review of laboratory studies and re-evaluation of patient's condition.  I assumed direction of critical care for this patient from another provider in my specialty: no    This document was prepared using Dragon voice recognition software and may include unintentional dictation errors.    Ottie Glazier, M.D.  Division of [place]

## 2018-11-02 NOTE — Progress Notes (Signed)
Initial Nutrition Assessment  RD working remotely.  DOCUMENTATION CODES:   Not applicable   INTERVENTION:  Initiate Vital 1.5 at 20 mL/hr and advance by 10 mL/hr every 8 hours to goal rate of 40 mL/hr (960 mL goal daily volume). Also provide Pro-Stat 30 mL TID per tube. Provides 1740 kcal, 110 grams of protein, 730 mL H2O daily. With current propofol rate provides 1865 kcal daily.  Provide minimum free water flush of 30 mL Q4hrs to maintain tube patency.  NUTRITION DIAGNOSIS:   Inadequate oral intake related to inability to eat as evidenced by NPO status.  GOAL:   Provide needs based on ASPEN/SCCM guidelines  MONITOR:   Vent status, Labs, Weight trends, TF tolerance, I & O's  REASON FOR ASSESSMENT:   Ventilator, Consult Enteral/tube feeding initiation and management  ASSESSMENT:   63 year old female with PMHx of GERD, psoriasis, anxiety, COPD admitted with severe acute hypoxic and hypercapnic respiratory failure from PNA and septic shock requiring intubation on 3/29, severe COPD exacerbation, undergoing r/o for COVID-19. Of note patient had intractable N/V prior to admission.   Patient intubated and sedated. On PRVC mode with FiO2 50% and PEEP 5 cmH2O. Abdomen distended but soft per RN assessment in chart. Last BM unknown/PTA.  Enteral Access: OGT placed 3/29; terminates in distal stomach per abdominal x-ray 3/29; no cm marking documented in chart at this time  MAP: 81-93 mmHg  Patient is currently intubated on ventilator support Ve: 9.1 L/min Temp (24hrs), Avg:99 F (37.2 C), Min:97.7 F (36.5 C), Max:102.2 F (39 C)  Propofol: 4.74 mL/hr (125 kcal daily)  Medications reviewed and include: famotidine, ceftriaxone, Precedex gtt, doxycycline, fentanyl gtt, norepinephrine gtt at 12 mcg/min, propofol gtt, sodium bicarbonate in sterile water at 150 mL/hr.  Labs reviewed: Sodium 132, CO2 16, BUN 25, Creatinine 1.29.  I/O: 1700 mL UOP yesterday  Discussed with  RN.  NUTRITION - FOCUSED PHYSICAL EXAM:  Unable to complete at this time.  Diet Order:   Diet Order    None     EDUCATION NEEDS:   No education needs have been identified at this time  Skin:  Skin Assessment: Reviewed RN Assessment(ecchymosis)  Last BM:  Unknown/PTA  Height:   Ht Readings from Last 1 Encounters:  11/01/18 5\' 6"  (1.676 m)   Weight:   Wt Readings from Last 1 Encounters:  11/01/18 79 kg   Ideal Body Weight:  59.1 kg  BMI:  Body mass index is 28.11 kg/m.  Estimated Nutritional Needs:   Kcal:  1890 (PSU 2003b w/ MSJ 1361, Ve 9.1, Tmax 39)  Protein:  95-120 grams (1.2-1.5 grams/kg)  Fluid:  2-2.3 L/day (25-30 mL/kg)  Willey Blade, MS, RD, LDN Office: [phone] Pager: [phone] After Hours/Weekend Pager: [phone]

## 2018-11-03 DIAGNOSIS — Z881 Allergy status to other antibiotic agents status: Secondary | ICD-10-CM

## 2018-11-03 DIAGNOSIS — M545 Low back pain: Secondary | ICD-10-CM

## 2018-11-03 DIAGNOSIS — R509 Fever, unspecified: Secondary | ICD-10-CM

## 2018-11-03 DIAGNOSIS — Z8739 Personal history of other diseases of the musculoskeletal system and connective tissue: Secondary | ICD-10-CM

## 2018-11-03 DIAGNOSIS — R0689 Other abnormalities of breathing: Secondary | ICD-10-CM

## 2018-11-03 DIAGNOSIS — R0902 Hypoxemia: Secondary | ICD-10-CM

## 2018-11-03 DIAGNOSIS — M7061 Trochanteric bursitis, right hip: Secondary | ICD-10-CM

## 2018-11-03 DIAGNOSIS — C6932 Malignant neoplasm of left choroid: Secondary | ICD-10-CM

## 2018-11-03 DIAGNOSIS — Z9911 Dependence on respirator [ventilator] status: Secondary | ICD-10-CM

## 2018-11-03 DIAGNOSIS — F17210 Nicotine dependence, cigarettes, uncomplicated: Secondary | ICD-10-CM

## 2018-11-03 DIAGNOSIS — G8929 Other chronic pain: Secondary | ICD-10-CM

## 2018-11-03 LAB — CBC WITH DIFFERENTIAL/PLATELET
Abs Immature Granulocytes: 0.01 10*3/uL (ref 0.00–0.07)
Basophils Absolute: 0 10*3/uL (ref 0.0–0.1)
Basophils Relative: 0 %
Eosinophils Absolute: 0.1 10*3/uL (ref 0.0–0.5)
Eosinophils Relative: 1 %
HCT: 29.4 % — ABNORMAL LOW (ref 36.0–46.0)
HEMOGLOBIN: 10 g/dL — AB (ref 12.0–15.0)
Immature Granulocytes: 0 %
Lymphocytes Relative: 14 %
Lymphs Abs: 0.6 10*3/uL — ABNORMAL LOW (ref 0.7–4.0)
MCH: 31.8 pg (ref 26.0–34.0)
MCHC: 34 g/dL (ref 30.0–36.0)
MCV: 93.6 fL (ref 80.0–100.0)
MONOS PCT: 11 %
Monocytes Absolute: 0.5 10*3/uL (ref 0.1–1.0)
Neutro Abs: 3.3 10*3/uL (ref 1.7–7.7)
Neutrophils Relative %: 74 %
Platelets: 99 10*3/uL — ABNORMAL LOW (ref 150–400)
RBC: 3.14 MIL/uL — ABNORMAL LOW (ref 3.87–5.11)
RDW: 13.3 % (ref 11.5–15.5)
WBC: 4.6 10*3/uL (ref 4.0–10.5)
nRBC: 0 % (ref 0.0–0.2)

## 2018-11-03 LAB — BASIC METABOLIC PANEL
Anion gap: 10 (ref 5–15)
Anion gap: 9 (ref 5–15)
BUN: 22 mg/dL (ref 8–23)
BUN: 22 mg/dL (ref 8–23)
CHLORIDE: 96 mmol/L — AB (ref 98–111)
CO2: 33 mmol/L — ABNORMAL HIGH (ref 22–32)
CO2: 30 mmol/L (ref 22–32)
CREATININE: 0.68 mg/dL (ref 0.44–1.00)
Calcium: 7.3 mg/dL — ABNORMAL LOW (ref 8.9–10.3)
Calcium: 7.4 mg/dL — ABNORMAL LOW (ref 8.9–10.3)
Chloride: 95 mmol/L — ABNORMAL LOW (ref 98–111)
Creatinine, Ser: 0.74 mg/dL (ref 0.44–1.00)
GFR calc Af Amer: >60 mL/min (ref >60)
GFR calc Af Amer: >60 mL/min (ref >60)
GFR calc non Af Amer: >60 mL/min (ref >60)
GFR calc non Af Amer: >60 mL/min (ref >60)
Glucose, Bld: 144 mg/dL — ABNORMAL HIGH (ref 70–99)
Glucose, Bld: 159 mg/dL — ABNORMAL HIGH (ref 70–99)
POTASSIUM: 3.2 mmol/L — AB (ref 3.5–5.1)
Potassium: 3.4 mmol/L — ABNORMAL LOW (ref 3.5–5.1)
Sodium: 138 mmol/L (ref 135–145)
Sodium: 135 mmol/L (ref 135–145)

## 2018-11-03 LAB — GLUCOSE, CAPILLARY
Glucose-Capillary: 136 mg/dL — ABNORMAL HIGH (ref 70–99)
Glucose-Capillary: 137 mg/dL — ABNORMAL HIGH (ref 70–99)
Glucose-Capillary: 147 mg/dL — ABNORMAL HIGH (ref 70–99)
Glucose-Capillary: 162 mg/dL — ABNORMAL HIGH (ref 70–99)
Glucose-Capillary: 169 mg/dL — ABNORMAL HIGH (ref 70–99)
Glucose-Capillary: 175 mg/dL — ABNORMAL HIGH (ref 70–99)

## 2018-11-03 LAB — MAGNESIUM: Magnesium: 2.1 mg/dL (ref 1.7–2.4)

## 2018-11-03 LAB — HIV ANTIBODY (ROUTINE TESTING W REFLEX): HIV Screen 4th Generation wRfx: NONREACTIVE

## 2018-11-03 LAB — ECHOCARDIOGRAM COMPLETE
Height: 66 in
Weight: 2786.61 oz

## 2018-11-03 LAB — PHOSPHORUS
Phosphorus: 1.4 mg/dL — ABNORMAL LOW (ref 2.5–4.6)
Phosphorus: 2.5 mg/dL (ref 2.5–4.6)

## 2018-11-03 MED ORDER — SODIUM CHLORIDE 0.9 % IV SOLN
Freq: Once | INTRAVENOUS | Status: AC
  Administered 2018-11-03: 23:00:00 via INTRAVENOUS
  Filled 2018-11-03: qty 15

## 2018-11-03 MED ORDER — SENNOSIDES-DOCUSATE SODIUM 8.6-50 MG PO TABS
2.0000 | ORAL_TABLET | Freq: Two times a day (BID) | ORAL | Status: DC
  Administered 2018-11-03 – 2018-11-10 (×14): 2
  Filled 2018-11-03 (×14): qty 2

## 2018-11-03 MED ORDER — VANCOMYCIN HCL 10 G IV SOLR
1500.0000 mg | Freq: Once | INTRAVENOUS | Status: AC
  Administered 2018-11-03: 1500 mg via INTRAVENOUS
  Filled 2018-11-03: qty 1500

## 2018-11-03 MED ORDER — SODIUM CHLORIDE 0.9 % IV SOLN
1.0000 g | Freq: Three times a day (TID) | INTRAVENOUS | Status: DC
  Administered 2018-11-03 (×2): 1 g via INTRAVENOUS
  Filled 2018-11-03 (×4): qty 1

## 2018-11-03 MED ORDER — POLYETHYLENE GLYCOL 3350 17 G PO PACK
17.0000 g | PACK | Freq: Once | ORAL | Status: AC
  Administered 2018-11-03: 17 g
  Filled 2018-11-03: qty 1

## 2018-11-03 MED ORDER — POLYETHYLENE GLYCOL 3350 17 G PO PACK
17.0000 g | PACK | ORAL | Status: DC
  Administered 2018-11-05 – 2018-11-10 (×6): 17 g
  Filled 2018-11-03 (×7): qty 1

## 2018-11-03 MED ORDER — VANCOMYCIN HCL IN DEXTROSE 750-5 MG/150ML-% IV SOLN
750.0000 mg | Freq: Three times a day (TID) | INTRAVENOUS | Status: DC
  Administered 2018-11-04 (×2): 750 mg via INTRAVENOUS
  Filled 2018-11-03 (×4): qty 150

## 2018-11-03 MED ORDER — METRONIDAZOLE IN NACL 5-0.79 MG/ML-% IV SOLN
500.0000 mg | Freq: Three times a day (TID) | INTRAVENOUS | Status: DC
  Administered 2018-11-03 – 2018-11-11 (×23): 500 mg via INTRAVENOUS
  Filled 2018-11-03 (×25): qty 100

## 2018-11-03 MED ORDER — SODIUM CHLORIDE 0.9 % IV SOLN
100.0000 mg | Freq: Two times a day (BID) | INTRAVENOUS | Status: DC
  Filled 2018-11-03 (×2): qty 100

## 2018-11-03 MED ORDER — SODIUM CHLORIDE 0.9 % IV SOLN
2.0000 g | Freq: Two times a day (BID) | INTRAVENOUS | Status: DC
  Administered 2018-11-03 – 2018-11-04 (×2): 2 g via INTRAVENOUS
  Filled 2018-11-03 (×3): qty 2

## 2018-11-03 MED ORDER — PIPERACILLIN-TAZOBACTAM 3.375 G IVPB: 3.3750 g | Freq: Three times a day (TID) | INTRAVENOUS | Status: DC

## 2018-11-03 MED ORDER — POTASSIUM PHOSPHATES 15 MMOLE/5ML IV SOLN
30.0000 mmol | Freq: Once | INTRAVENOUS | Status: AC
  Administered 2018-11-03: 30 mmol via INTRAVENOUS
  Filled 2018-11-03: qty 10

## 2018-11-03 NOTE — Progress Notes (Signed)
Pharmacy Antibiotic Note  Cindy Morrison is a 63 y.o. female admitted on 11/01/2018 with sepsis.  Pharmacy has been consulted for vancomycin dosing.  Plan: Vancomycin 1500mg  IV x 1 followed by vancomycin 750mg  IV Q8hr.   Cefepime 2g IV Q12hr.   Metronidazole 500mg  IV Q8hr.   Height: 5\' 6"  (167.6 cm) Weight: 174 lb 2.6 oz (79 kg) IBW/kg (Calculated) : 59.3  Temp (24hrs), Avg:100.8 F (38.2 C), Min:100 F (37.8 C), Max:101.8 F (38.8 C)  Recent Labs  Lab 11/01/18 0945 11/01/18 1024 11/01/18 1757 11/01/18 2043 11/02/18 0226 11/03/18 0449 11/03/18 1713  WBC 13.7*  --  9.1  --  16.8* 4.6  --   CREATININE 1.04*  --  0.89 0.88 1.29* 0.74 0.68  LATICACIDVEN  --  1.9 1.4  --   --   --   --     Estimated Creatinine Clearance: 75.4 mL/min (by C-G formula based on SCr of 0.68 mg/dL).    Allergies  Allergen Reactions  . Erythromycin Base Swelling    Erythromycin ophthalmic ointment     Thank you for allowing pharmacy to be a part of this patient's care.  Kelson Queenan L 11/03/2018 9:50 PM

## 2018-11-03 NOTE — Consult Note (Signed)
NAME: Cindy Morrison  DOB: 11/07/1955  MRN: [id number]  Date/Time: 11/03/2018 3:49 PM  REQUESTING PROVIDER: Lanney Gins Subjective:  REASON FOR CONSULT: Fever ? [place] Cindy Morrison is a 63 y.o. with a history of choroidal malignant melanoma left eye , trochanteric bursitis rt hip, chronic low back pain was getting PT Was admitted on 3/29 with N/V  Spoke to her husband and according to him pt was doing well on Friday night. They had dinner and she went to bed/ On 3/28 ( Saturday) she was not feeling well and was lying in bed the whole time. She was c/o low back pain ( which she had for the past 6 months according to her husband) She vomited when she drank water. Her husband brought her in as she was feeling disoriented. In the ED on 3/29 initially she did not have any temperature BP was 105/67. She was worked up for any dissection of aorta and had CTA of the chest and abdomen and it was negative. She was intubated as she was hypoxic and hypercapnic. She was started on ceftriaxone, vanco, doxy. She was also tested for COVID. She is on pressor I am asked to see the patient as she has  persistent fever. She is on precedex and propafol, she has been started on meropenem last night. She is a current smoker and husband did not note any worse cough than baseline. 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 Saw her ophthalmologist at [facility] 2 weeks ago. She has not taken any recent antibiotics  Past Medical History:  Diagnosis Date  . Anxiety   . Cancer ([place])    choroid melanoma left eye  . GERD (gastroesophageal reflux disease)   . Psoriasis     Past Surgical History:  Procedure Laterality Date  . ABDOMINAL HYSTERECTOMY    . BICEPT TENODESIS Right 12/18/2017   Procedure: BICEPS TENODESIS;  Surgeon: Leim Fabry, MD;  Location: [facility] ORS;  Service: Orthopedics;  Laterality: Right;  . BRAIN TUMOR EXCISION  2007  . COLONOSCOPY    . ESOPHAGOGASTRODUODENOSCOPY    . EYE  SURGERY  2012  . MOHS SURGERY  2018  . SHOULDER ARTHROSCOPY WITH OPEN ROTATOR CUFF REPAIR Right 12/18/2017   Procedure: SHOULDER ARTHROSCOPY WITH OPEN ROTATOR CUFF REPAIR,DISTAL CLAVICLE EXCISION,SUBACROMIAL DECOMPRESSION;  Surgeon: Leim Fabry, MD;  Location: [facility] ORS;  Service: Orthopedics;  Laterality: Right;    Social History   Socioeconomic History  . Marital status: Married    Spouse name: Not on file  . Number of children: Not on file  . Years of education: Not on file  . Highest education level: Not on file  Occupational History  . Not on file  Social Needs  . Financial resource strain: Not on file  . Food insecurity:    Worry: Not on file    Inability: Not on file  . Transportation needs:    Medical: Not on file    Non-medical: Not on file  Tobacco Use  . Smoking status: Current Every Day Smoker    Packs/day: 1.00  . Smokeless tobacco: Never Used  Substance and Sexual Activity  . Alcohol use: Yes    Frequency: Never    Comment: occasionally  . Drug use: Never  . Sexual activity: Not on file  Lifestyle  . Physical activity:    Days per week: Not on file    Minutes per session: Not on file  . Stress: Not on file  Relationships  . Social connections:    Talks on phone: Not on file    Gets together: Not on file    Attends religious service: Not on file    Active member of club or organization: Not on file    Attends meetings of clubs or organizations: Not on file    Relationship status: Not on file  . Intimate partner violence:    Fear of current or ex partner: Not on file    Emotionally abused: Not on file    Physically abused: Not on file    Forced sexual activity: Not on file  Other Topics Concern  . Not on file  Social History Narrative  . Not on file    Family History  Problem Relation Age of Onset  . Heart attack Father   . Lung cancer Father    Allergies  Allergen Reactions  . Erythromycin Base Swelling    Erythromycin ophthalmic ointment   ? Current Facility-Administered Medications  Medication Dose Route Frequency Provider Last Rate Last Dose  . acetaminophen (TYLENOL) tablet 650 mg  650 mg Per Tube Q6H PRN Ottie Glazier, MD   650 mg at 11/03/18 1139   Or  . acetaminophen (TYLENOL) suppository 650 mg  650 mg Rectal Q6H PRN Ottie Glazier, MD      . bisacodyl (DULCOLAX) suppository 10 mg  10 mg Rectal Daily PRN Tukov-Yual, Magdalene S, NP      . budesonide (PULMICORT) nebulizer solution 0.5 mg  0.5 mg Nebulization BID Tukov-Yual, Magdalene S, NP   0.5 mg at 11/03/18 0729  . chlorhexidine gluconate (MEDLINE KIT) (PERIDEX) 0.12 % solution 15 mL  15 mL Mouth Rinse BID Flora Lipps, MD   15 mL at 11/03/18 0818  . Chlorhexidine Gluconate Cloth 2 % PADS 6 each  6 each Topical Daily Flora Lipps, MD   6 each at 11/03/18 [phone]  . dexmedetomidine (PRECEDEX) 400 MCG/100ML (4 mcg/mL) infusion  0.4-1.2 mcg/kg/hr Intravenous Titrated Ottie Glazier, MD 23.7 mL/hr at 11/03/18 1522 1.2 mcg/kg/hr at 11/03/18 1522  . doxycycline (VIBRAMYCIN) 200 mg in dextrose 5 % 250 mL IVPB  200 mg Intravenous Q12H Ottie Glazier, MD   Stopped at 11/03/18 1046  . enoxaparin (LOVENOX) injection 40 mg  40 mg Subcutaneous Q24H Ottie Glazier, MD   40 mg at 11/02/18 2149  . famotidine (PEPCID) tablet 20 mg  20 mg Per Tube Q12H Charlett Nose, RPH   20 mg at 11/03/18 1224  . feeding supplement (PRO-STAT SUGAR FREE 64) liquid 30 mL  30 mL Per Tube TID Ottie Glazier, MD   30 mL at 11/03/18 0812  . feeding supplement (VITAL 1.5 CAL) liquid 1,000 mL  1,000 mL Per Tube Q24H Aleskerov, Fuad, MD 40 mL/hr at 11/03/18 1140 1,000 mL at 11/03/18 1140  . fentaNYL (SUBLIMAZE) injection 100 mcg  100 mcg Intravenous Once Flora Lipps, MD      . fentaNYL 2537mg in NS 2566m(10109mml) infusion-PREMIX  0-400 mcg/hr Intravenous Continuous KasFlora LippsD 27.5 mL/hr at 11/03/18 1148 275 mcg/hr at 11/03/18 1148  . ipratropium-albuterol (DUONEB) 0.5-2.5 (3) MG/3ML nebulizer  solution 3 mL  3 mL Nebulization Q6H Tukov-Yual, Magdalene S, NP   3 mL at 11/03/18 1402  . MEDLINE mouth rinse  15 mL Mouth Rinse 10 times per day KasFlora LippsD   15 mL at 11/03/18 1419  . meropenem (MERREM) 1 g in sodium chloride 0.9 % 100 mL IVPB  1 g Intravenous Q8H SimCharlett Nose  [place]   Stopped at 11/03/18 0820  . midazolam (VERSED) injection 2 mg  2 mg Intravenous Once Mayo, Pete Pelt, MD      . midazolam (VERSED) injection 2 mg  2 mg Intravenous Q4H PRN Awilda Bill, NP   2 mg at 11/03/18 1400  . norepinephrine (LEVOPHED) 16 mg in 268m premix infusion  0-40 mcg/min Intravenous Titrated Tukov-Yual, Magdalene S, NP 4.69 mL/hr at 11/03/18 1148 5 mcg/min at 11/03/18 1148  . ondansetron (ZOFRAN) tablet 4 mg  4 mg Per NG tube Q6H PRN Tukov-Yual, Magdalene S, NP       Or  . ondansetron (ZOFRAN) injection 4 mg  4 mg Intravenous Q6H PRN Tukov-Yual, Magdalene S, NP      . polyethylene glycol (MIRALAX / GLYCOLAX) packet 17 g  17 g Oral Daily PRN Mayo, KPete Pelt MD      . [Derrill MemoON 11/04/2018] polyethylene glycol (MIRALAX / GLYCOLAX) packet 17 g  17 g Per Tube Q24H Aleskerov, Fuad, MD      . promethazine (PHENERGAN) injection 12.5 mg  12.5 mg Intravenous Q6H PRN Mayo, KPete Pelt MD   12.5 mg at 11/01/18 1247  . propofol (DIPRIVAN) 1000 MG/100ML infusion  0-50 mcg/kg/min Intravenous Continuous Tukov-Yual, MArlyss Gandy NP   Stopped at 11/02/18 0810  . senna-docusate (Senokot-S) tablet 2 tablet  2 tablet Per Tube Q12H AOttie Glazier MD   2 tablet at 11/03/18 1144  . sennosides (SENOKOT) 8.8 MG/5ML syrup 5 mL  5 mL Per Tube BID PRN Tukov-Yual, Magdalene S, NP   5 mL at 11/02/18 1536  . vecuronium (NORCURON) injection 10 mg  10 mg Intravenous Q1H PRN Tukov-Yual, Magdalene S, NP      . venlafaxine (EFFEXOR) tablet 75 mg  75 mg Per NG tube BID WC Tukov-Yual, Magdalene S, NP   75 mg at 11/03/18 01601    Abtx:  Anti-infectives (From admission, onward)   Start     Dose/Rate Route Frequency Ordered  Stop   11/03/18 0800  meropenem (MERREM) 1 g in sodium chloride 0.9 % 100 mL IVPB     1 g 200 mL/hr over 30 Minutes Intravenous Every 8 hours 11/03/18 0626     11/02/18 2230  meropenem (MERREM) 1 g in sodium chloride 0.9 % 100 mL IVPB  Status:  Discontinued     1 g 200 mL/hr over 30 Minutes Intravenous Every 12 hours 11/02/18 2222 11/03/18 0626   11/02/18 2215  vancomycin (VANCOCIN) IVPB 750 mg/150 ml premix  Status:  Discontinued     750 mg 150 mL/hr over 60 Minutes Intravenous  Once 11/02/18 2211 11/02/18 2212   11/02/18 2211  vancomycin variable dose per unstable renal function (pharmacist dosing)  Status:  Discontinued      Does not apply See admin instructions 11/02/18 2211 11/02/18 2212   11/02/18 2000  cefTRIAXone (ROCEPHIN) 2 g in sodium chloride 0.9 % 100 mL IVPB  Status:  Discontinued     2 g 200 mL/hr over 30 Minutes Intravenous Every 24 hours 11/02/18 1111 11/02/18 2222   11/02/18 2000  doxycycline (VIBRAMYCIN) 200 mg in dextrose 5 % 250 mL IVPB     200 mg 125 mL/hr over 120 Minutes Intravenous Every 12 hours 11/02/18 1111     11/01/18 1730  cefTRIAXone (ROCEPHIN) 2 g in sodium chloride 0.9 % 100 mL IVPB  Status:  Discontinued     2 g 200 mL/hr over 30 Minutes Intravenous Every 24 hours 11/01/18 1727 11/02/18  1111   11/01/18 1730  doxycycline (VIBRAMYCIN) 200 mg in dextrose 5 % 250 mL IVPB  Status:  Discontinued     200 mg 125 mL/hr over 120 Minutes Intravenous Every 12 hours 11/01/18 1727 11/02/18 1111      REVIEW OF SYSTEMS:  NA Objective:  VITALS:  BP 96/63   Pulse 68   Temp 100 F (37.8 C)   Resp 20   Ht '5\' 6"'$  (1.676 m)   Wt 79 kg   SpO2 95%   BMI 28.11 kg/m  PHYSICAL EXAM:  General: intubated/sedated  Head: Normocephalic, without obvious abnormality, atraumatic. Eyes: Conjunctivae clear, anicteric sclerae. Pupils are equal ENT cannot examine Neck: Supple, Back: did not examine Lungs:b/l air entry Heart: s1s2 Abdomen:firm upper abdomen and soft lower  abdomen Bowel sounds normal. No masses Extremities: rt femoral line- bruising rt hip area Skin: No rashes or lesions. Or bruising Lymph: Cervical, supraclavicular normal. Neurologic: cannot be examined Pertinent Labs Lab Results CBC    Component Value Date/Time   WBC 4.6 11/03/2018 0449   RBC 3.14 (L) 11/03/2018 0449   HGB 10.0 (L) 11/03/2018 0449   HGB 14.6 03/16/2014 1028   HCT 29.4 (L) 11/03/2018 0449   HCT 43.8 03/16/2014 1028   PLT 99 (L) 11/03/2018 0449   PLT 239 03/16/2014 1028   MCV 93.6 11/03/2018 0449   MCV 92 03/16/2014 1028   MCH 31.8 11/03/2018 0449   MCHC 34.0 11/03/2018 0449   RDW 13.3 11/03/2018 0449   RDW 13.8 03/16/2014 1028   LYMPHSABS 0.6 (L) 11/03/2018 0449   LYMPHSABS 2.3 03/16/2014 1028   MONOABS 0.5 11/03/2018 0449   MONOABS 0.6 03/16/2014 1028   EOSABS 0.1 11/03/2018 0449   EOSABS 0.1 03/16/2014 1028   BASOSABS 0.0 11/03/2018 0449   BASOSABS 0.1 03/16/2014 1028    CMP Latest Ref Rng & Units 11/03/2018 11/02/2018 11/01/2018  Glucose 70 - 99 mg/dL 144(H) 162(H) 171(H)  BUN 8 - 23 mg/dL 22 25(H) 23  Creatinine 0.44 - 1.00 mg/dL 0.74 1.29(H) 0.88  Sodium 135 - 145 mmol/L 138 132(L) 131(L)  Potassium 3.5 - 5.1 mmol/L 3.2(L) 4.2 3.5  Chloride 98 - 111 mmol/L 95(L) 109 103  CO2 22 - 32 mmol/L 33(H) 16(L) 18(L)  Calcium 8.9 - 10.3 mg/dL 7.3(L) 7.2(L) 7.4(L)  Total Protein 6.5 - 8.1 g/dL - - -  Total Bilirubin 0.3 - 1.2 mg/dL - - -  Alkaline Phos 38 - 126 U/L - - -  AST 15 - 41 U/L - - -  ALT 0 - 44 U/L - - -      Microbiology: Recent Results (from the past 240 hour(s))  Respiratory Panel by PCR     Status: None   Collection Time: 11/02/18 12:01 AM  Result Value Ref Range Status   Adenovirus NOT DETECTED NOT DETECTED Final   Coronavirus 229E NOT DETECTED NOT DETECTED Final    Comment: (NOTE) The Coronavirus on the Respiratory Panel, DOES NOT test for the novel  Coronavirus (2019 nCoV)    Coronavirus HKU1 NOT DETECTED NOT DETECTED Final    Coronavirus NL63 NOT DETECTED NOT DETECTED Final   Coronavirus OC43 NOT DETECTED NOT DETECTED Final   Metapneumovirus NOT DETECTED NOT DETECTED Final   Rhinovirus / Enterovirus NOT DETECTED NOT DETECTED Final   Influenza A NOT DETECTED NOT DETECTED Final   Influenza B NOT DETECTED NOT DETECTED Final   Parainfluenza Virus 1 NOT DETECTED NOT DETECTED Final   Parainfluenza Virus 2 NOT DETECTED  NOT DETECTED Final   Parainfluenza Virus 3 NOT DETECTED NOT DETECTED Final   Parainfluenza Virus 4 NOT DETECTED NOT DETECTED Final   Respiratory Syncytial Virus NOT DETECTED NOT DETECTED Final   Bordetella pertussis NOT DETECTED NOT DETECTED Final   Chlamydophila pneumoniae NOT DETECTED NOT DETECTED Final   Mycoplasma pneumoniae NOT DETECTED NOT DETECTED Final    Comment: Performed at [facility], [place] [address], [place], [place] [address]  MRSA PCR Screening     Status: None   Collection Time: 11/02/18 12:01 AM  Result Value Ref Range Status   MRSA by PCR NEGATIVE NEGATIVE Final    Comment:        The GeneXpert MRSA Assay (FDA approved for NASAL specimens only), is one component of a comprehensive MRSA colonization surveillance program. It is not intended to diagnose MRSA infection nor to guide or monitor treatment for MRSA infections. Performed at [facility], [address] [place], [place] [address]   Culture, blood (Routine X 2) w Reflex to ID Panel     Status: None (Preliminary result)   Collection Time: 11/02/18  2:40 AM  Result Value Ref Range Status   Specimen Description BLOOD RIGHT WRIST  Final   Special Requests   Final    BOTTLES DRAWN AEROBIC AND ANAEROBIC Blood Culture adequate volume   Culture   Final    NO GROWTH 1 DAY Performed at [facility], [address], [place], [place] [address]    Report Status PENDING  Incomplete  Culture, blood (Routine X 2) w Reflex to ID Panel     Status: None (Preliminary result)   Collection Time: 11/02/18   2:47 AM  Result Value Ref Range Status   Specimen Description BLOOD RIGHT HAND  Final   Special Requests   Final    BOTTLES DRAWN AEROBIC AND ANAEROBIC Blood Culture adequate volume   Culture   Final    NO GROWTH 1 DAY Performed at [facility], [address], [place], [place] [address]    Report Status PENDING  Incomplete    IMAGING RESULTS: CT chest and abdomen  No definite evidence of thoracic or abdominal aortic dissection or aneurysm. No evidence of mesenteric or renal artery stenosis.  Mild bibasilar subsegmental atelectasis is noted. I have personally reviewed the films ? Impression/Recommendation ? ?63 yr female presenting with low back pain, some disorientation and found to be hypotensive, , hypoxic later and intubated, also has high procal , fever   Sepsis like picture- unclear etiology. No evidence of pneumonia or UTI Could she have Group A strep?? Does she have discitis/vertebral issues as she has low back pain Will need MRI of the lumbar area once she is extubated She is currently on meropenem and c=doxy- change to Vnaco/cefepime and flagyl   Smoker, had hypoxia/hypercarbia and was intubated Unlikely this is COVID  ?Has malignant melanoma of the left eye - being observed H/o sciatica and trochanteric bursitis ___________________________________________________ Discussed with pharmacist, nurse and her husband .

## 2018-11-03 NOTE — Progress Notes (Signed)
CRITICAL CARE NOTE        SUBJECTIVE FINDINGS & SIGNIFICANT EVENTS   Patient remains critically ill, Tmax>101 with tylenol overnight.  On levophed 14mg.   ABG improved , currently with permissive hypercapnia.  Plan for SBT today to evaluate readiness for liberation. RASS-1 PCT is severely elevated pointing away from novel corona infection however lymphopenia is signal toward it, hopefully results come out soon (today will be day 3 waiting) . Cortisol low in context of critical illness. CXR this am improved from yesterday.  S/p TTE report pending  PAST MEDICAL HISTORY   Past Medical History:  Diagnosis Date  . Anxiety   . [place] ([place]    choroid melanoma left eye  . GERD (gastroesophageal reflux disease)   . Psoriasis      SURGICAL HISTORY   Past Surgical History:  Procedure Laterality Date  . ABDOMINAL HYSTERECTOMY    . BICEPT TENODESIS Right 12/18/2017   Procedure: BICEPS TENODESIS;  Surgeon: PLeim Fabry MD;  Location: [facility] ORS;  Service: Orthopedics;  Laterality: Right;  . BRAIN TUMOR EXCISION  2007  . COLONOSCOPY    . ESOPHAGOGASTRODUODENOSCOPY    . EYE SURGERY  2012  . MOHS SURGERY  2018  . SHOULDER ARTHROSCOPY WITH OPEN ROTATOR CUFF REPAIR Right 12/18/2017   Procedure: SHOULDER ARTHROSCOPY WITH OPEN ROTATOR CUFF REPAIR,DISTAL CLAVICLE EXCISION,SUBACROMIAL DECOMPRESSION;  Surgeon: PLeim Fabry MD;  Location: [facility] ORS;  Service: Orthopedics;  Laterality: Right;     FAMILY HISTORY   Family History  Problem Relation Age of Onset  . Heart attack Father   . Lung cancer Father      SOCIAL HISTORY   Social History   Tobacco Use  . Smoking status: Current Every Day Smoker    Packs/day: 1.00  . Smokeless tobacco: Never Used  Substance Use Topics  . Alcohol use: Yes    Frequency: Never     Comment: occasionally  . Drug use: Never     MEDICATIONS   Current Medication:  Current Facility-Administered Medications:  .  acetaminophen (TYLENOL) tablet 650 mg, 650 mg, Per Tube, Q6H PRN, 650 mg at 11/03/18 0453 **OR** acetaminophen (TYLENOL) suppository 650 mg, 650 mg, Rectal, Q6H PRN, AOttie Glazier MD .  bisacodyl (DULCOLAX) suppository 10 mg, 10 mg, Rectal, Daily PRN, Tukov-Yual, Magdalene S, NP .  budesonide (PULMICORT) nebulizer solution 0.5 mg, 0.5 mg, Nebulization, BID, Tukov-Yual, Magdalene S, NP, 0.5 mg at 11/03/18 0729 .  chlorhexidine gluconate (MEDLINE KIT) (PERIDEX) 0.12 % solution 15 mL, 15 mL, Mouth Rinse, BID, Kasa, Kurian, MD, 15 mL at 11/02/18 2000 .  Chlorhexidine Gluconate Cloth 2 % PADS 6 each, 6 each, Topical, Daily, KFlora Lipps MD, 6 each at 11/02/18 0801 .  dexmedetomidine (PRECEDEX) 400 MCG/100ML (4 mcg/mL) infusion, 0.4-1.2 mcg/kg/hr, Intravenous, Titrated, Brayam Boeke, MD, Last Rate: 23.7 mL/hr at 11/03/18 0635, 1.2 mcg/kg/hr at 11/03/18 0635 .  doxycycline (VIBRAMYCIN) 200 mg in dextrose 5 % 250 mL IVPB, 200 mg, Intravenous, Q12H, Makisha Marrin, MD, Last Rate: 125 mL/hr at 11/02/18 2250, 200 mg at 11/02/18 2250 .  enoxaparin (LOVENOX) injection 40 mg, 40 mg, Subcutaneous, Q24H, Brielle Moro, MD, 40 mg at 11/02/18 2149 .  famotidine (PEPCID) tablet 20 mg, 20 mg, Per Tube, Q12H, SCharlett Nose RPH, 20 mg at 11/02/18 2145 .  feeding supplement (PRO-STAT SUGAR FREE 64) liquid 30 mL, 30 mL, Per Tube, TID, ALanney Gins Ballard Budney, MD, 30 mL at 11/02/18 2149 .  feeding supplement (VITAL 1.5 CAL) liquid 1,000 mL, 1,000  mL, Per Tube, Q24H, Jaiel Saraceno, MD, Last Rate: 20 mL/hr at 11/02/18 1333, 1,000 mL at 11/02/18 1333 .  fentaNYL (SUBLIMAZE) injection 100 mcg, 100 mcg, Intravenous, Once, Kasa, Kurian, MD .  fentaNYL 2528mg in NS 256m(1037mml) infusion-PREMIX, 0-400 mcg/hr, Intravenous, Continuous, Kasa, Kurian, MD, Last Rate: 20 mL/hr at 11/03/18  0548, 200 mcg/hr at 11/03/18 0548 .  ipratropium-albuterol (DUONEB) 0.5-2.5 (3) MG/3ML nebulizer solution 3 mL, 3 mL, Nebulization, Q6H, Tukov-Yual, Magdalene S, NP, 3 mL at 11/03/18 0729 .  MEDLINE mouth rinse, 15 mL, Mouth Rinse, 10 times per day, KasFlora LippsD, 15 mL at 11/03/18 0400 .  meropenem (MERREM) 1 g in sodium chloride 0.9 % 100 mL IVPB, 1 g, Intravenous, Q8H, SimCharlett NosePH, Last Rate: 200 mL/hr at 11/03/18 0714, 1 g at 11/03/18 0714 .  midazolam (VERSED) injection 2 mg, 2 mg, Intravenous, Once, Mayo, KatPete PeltD .  midazolam (VERSED) injection 2 mg, 2 mg, Intravenous, Q4H PRN, BlaAwilda BillP, 2 mg at 11/03/18 0010 .  norepinephrine (LEVOPHED) 16 mg in 250m88memix infusion, 0-40 mcg/min, Intravenous, Titrated, Tukov-Yual, Magdalene S, NP, Last Rate: 1.88 mL/hr at 11/03/18 0551, 2 mcg/min at 11/03/18 0551 .  norepinephrine (LEVOPHED) '4mg'$  in 250mL58mmix infusion, 0-40 mcg/min, Intravenous, Titrated, Tukov-Yual, Magdalene S, NP, Stopped at 11/02/18 0926 .  ondansetron (ZOFRAN) tablet 4 mg, 4 mg, Per NG tube, Q6H PRN **OR** ondansetron (ZOFRAN) injection 4 mg, 4 mg, Intravenous, Q6H PRN, Tukov-Yual, Magdalene S, NP .  polyethylene glycol (MIRALAX / GLYCOLAX) packet 17 g, 17 g, Oral, Daily PRN, Mayo, Katy Pete Pelt.  potassium PHOSPHATE 30 mmol in dextrose 5 % 500 mL infusion, 30 mmol, Intravenous, Once, SimpsCharlett Nose .  promethazine (PHENERGAN) injection 12.5 mg, 12.5 mg, Intravenous, Q6H PRN, Mayo, Katy Pete Pelt 12.5 mg at 11/01/18 1247 .  propofol (DIPRIVAN) 1000 MG/100ML infusion, 0-50 mcg/kg/min, Intravenous, Continuous, Tukov-Yual, Magdalene S, NP, Stopped at 11/02/18 0810 .  sennosides (SENOKOT) 8.8 MG/5ML syrup 5 mL, 5 mL, Per Tube, BID PRN, Tukov-Yual, Magdalene S, NP, 5 mL at 11/02/18 1536 .  sodium bicarbonate 150 mEq in sterile water 1,000 mL infusion, , Intravenous, Continuous, Tukov-Yual, Magdalene S, NP, Last Rate: 150 mL/hr at 11/03/18 0633 [phone] (NORCURON) injection 10 mg, 10 mg, Intravenous, Q1H PRN, Tukov-Yual, Magdalene S, NP .  venlafaxine (EFFEXOR) tablet 75 mg, 75 mg, Per NG tube, BID WC, Tukov-Yual, Magdalene S, NP, 75 mg at 11/02/18 2148    ALLERGIES   Erythromycin base    REVIEW OF SYSTEMS    Unable to obtain due to sedation on mechanical ventilation  PHYSICAL EXAMINATION   Vitals:   11/03/18 0730 11/03/18 0732  BP:    Pulse:    Resp:    Temp:    SpO2: 98% 98%    GENERAL: No apparent distress HEAD: Normocephalic, atraumatic.  EYES: Pupils equal, round,.  No scleral icterus.  MOUTH: Moist mucosal membrane. NECK: . No JVD.  PULMONARY: Nontachypneic CARDIOVASCULAR: Normal sinus rhythm on telemetry monitor GASTROINTESTINAL: Nondistended. MUSCULOSKELETAL: No grossly apparent swelling NEUROLOGIC: RASs -1 SKIN:intact   LABS AND IMAGING     -I personally reviewed most recent blood work, imaging and microbiology - significant findings today are hypokalemia, metabolic alkalosis, resolution of leukocytosis, anemia  LAB RESULTS: Recent Labs  Lab 11/01/18 2043 11/02/18 0226 11/03/18 0449  NA 131* 132* 138  K 3.5 4.2 3.2*  CL 103 109 95*  CO2 18* 16* 33*  BUN  23 25* 22  CREATININE 0.88 1.29* 0.74  GLUCOSE 171* 162* 144*   Recent Labs  Lab 11/01/18 1757 11/02/18 0226 11/03/18 0449  HGB 10.5* 11.7* 10.0*  HCT 31.6* 36.4 29.4*  WBC 9.1 16.8* 4.6  PLT 126* 136* 99*     IMAGING RESULTS: Dg Chest Port 1 View  Result Date: 11/02/2018 CLINICAL DATA:  Shortness of breath with hypotension and hypoxia. EXAM: PORTABLE CHEST 1 VIEW COMPARISON:  11/01/2018 FINDINGS: Endotracheal tube and enteric tube unchanged. Lungs are adequately inflated and demonstrate hazy opacification over the right base slightly worse. Overall improvement in previously noted patchy bilateral density. No definite effusion. Remainder of the exam is unchanged. IMPRESSION: Interval improvement to near resolution in  previously seen bilateral patchy density. Worsened mild hazy opacification over the right base which may be due to atelectasis or infection. Tubes and lines unchanged. Electronically Signed   By: Marin Olp M.D.   On: 11/02/2018 19:37          ASSESSMENT AND PLAN   -Multidisciplinary rounds held today  Acute Hypoxic Respiratory Failure    -poss COVID r/o  -background of COPD  -did not tolerate SBT will attempt again in am , reviewed ABG  -continue Full MV support -continue Bronchodilator Therapy -Wean Fio2 and PEEP as tolerated -will perform SAT/SBT when respiratory parameters are met    Acute gastroenteritis     -Possible COVID related - results pending   CARDIAC FAILURE- Demand ischemia with mild troponin elevation -Lasix as tolerated -follow up cardiac enzymes as indicated ICU monitoring  Renal Failure-resolved now -continue Foley Catheter-assess need daily   NEUROLOGY - intubated and sedated - minimal sedation to achieve a RASS goal: -1 Wake up assessment pending   ID- consult ordered-  Appreciate input - poss GAS vs lumbar spine related infectious culprit -continue IV abx as prescibed -follow up cultures  GI/Nutrition GI PROPHYLAXIS as indicated DIET-->TF's as tolerated Constipation protocol as indicated  ENDO - ICU hypoglycemic\Hyperglycemia protocol -check FSBS per protocol   ELECTROLYTES -follow labs as needed -replace as needed -pharmacy consultation   DVT/GI PRX ordered -SCDs  TRANSFUSIONS AS NEEDED MONITOR FSBS ASSESS the need for LABS as needed   Critical care provider statement:   Critical care time (minutes): 39  Critical care time was exclusive of: Separately billable procedures and treating other patients  Critical care was necessary to treat or prevent imminent or life-threatening deterioration of the following conditions: acute hypoxemic respiratory failure, COVID19 rule out, COPD, AKI, demand  ischemia , acute gastroenteritis  Critical care was time spent personally by me on the following activities: Development of treatment plan with patient or surrogate, discussions with consultants, evaluation of patient's response to treatment, examination of patient, obtaining history from patient or surrogate, ordering and performing treatments and interventions, ordering and review of laboratory studies and re-evaluation of patient's condition.  I assumed direction of critical care for this patient from another provider in my specialty: no   This document was prepared using Dragon voice recognition software and may include unintentional dictation errors.    Ottie Glazier, M.D.  Division of [place]

## 2018-11-04 ENCOUNTER — Inpatient Hospital Stay: Payer: Managed Care, Other (non HMO)

## 2018-11-04 DIAGNOSIS — C6992 Malignant neoplasm of unspecified site of left eye: Secondary | ICD-10-CM

## 2018-11-04 LAB — BLOOD GAS, ARTERIAL
Acid-Base Excess: 2.7 mmol/L — ABNORMAL HIGH (ref 0.0–2.0)
Acid-Base Excess: 5.6 mmol/L — ABNORMAL HIGH (ref 0.0–2.0)
Bicarbonate: 31.9 mmol/L — ABNORMAL HIGH (ref 20.0–28.0)
Bicarbonate: 32.8 mmol/L — ABNORMAL HIGH (ref 20.0–28.0)
FIO2: 1
FIO2: 0.9
MECHVT: 450 mL
MECHVT: 450 mL
O2 Saturation: 97.3 %
O2 Saturation: 94.7 %
PEEP: 5 cmH2O
PEEP: 5 cmH2O
Patient temperature: 37
Patient temperature: 37
RATE: 20 resp/min
RATE: 24 resp/min
pCO2 arterial: 71 mmHg (ref 32.0–48.0)
pCO2 arterial: 58 mmHg — ABNORMAL HIGH (ref 32.0–48.0)
pH, Arterial: 7.26 — ABNORMAL LOW (ref 7.350–7.450)
pH, Arterial: 7.36 (ref 7.350–7.450)
pO2, Arterial: 107 mmHg (ref 83.0–108.0)
pO2, Arterial: 77 mmHg — ABNORMAL LOW (ref 83.0–108.0)

## 2018-11-04 LAB — CBC
HCT: 30.8 % — ABNORMAL LOW (ref 36.0–46.0)
Hemoglobin: 10.1 g/dL — ABNORMAL LOW (ref 12.0–15.0)
MCH: 31.2 pg (ref 26.0–34.0)
MCHC: 32.8 g/dL (ref 30.0–36.0)
MCV: 95.1 fL (ref 80.0–100.0)
NRBC: 0 % (ref 0.0–0.2)
Platelets: 140 10*3/uL — ABNORMAL LOW (ref 150–400)
RBC: 3.24 MIL/uL — ABNORMAL LOW (ref 3.87–5.11)
RDW: 13.1 % (ref 11.5–15.5)
WBC: 4.7 10*3/uL (ref 4.0–10.5)

## 2018-11-04 LAB — BASIC METABOLIC PANEL
Anion gap: 5 (ref 5–15)
BUN: 22 mg/dL (ref 8–23)
CO2: 31 mmol/L (ref 22–32)
Calcium: 7.7 mg/dL — ABNORMAL LOW (ref 8.9–10.3)
Chloride: 102 mmol/L (ref 98–111)
Creatinine, Ser: 0.69 mg/dL (ref 0.44–1.00)
GFR calc Af Amer: >60 mL/min (ref >60)
GFR calc non Af Amer: >60 mL/min (ref >60)
Glucose, Bld: 167 mg/dL — ABNORMAL HIGH (ref 70–99)
POTASSIUM: 3.5 mmol/L (ref 3.5–5.1)
Sodium: 138 mmol/L (ref 135–145)

## 2018-11-04 LAB — NOVEL CORONAVIRUS, NAA (HOSP ORDER, SEND-OUT TO REF LAB; TAT 18-24 HRS): SARS-CoV-2, NAA: NOT DETECTED

## 2018-11-04 LAB — GLUCOSE, CAPILLARY
Glucose-Capillary: 158 mg/dL — ABNORMAL HIGH (ref 70–99)
Glucose-Capillary: 159 mg/dL — ABNORMAL HIGH (ref 70–99)
Glucose-Capillary: 179 mg/dL — ABNORMAL HIGH (ref 70–99)

## 2018-11-04 LAB — MAGNESIUM: Magnesium: 2 mg/dL (ref 1.7–2.4)

## 2018-11-04 LAB — PHOSPHORUS: Phosphorus: 2.4 mg/dL — ABNORMAL LOW (ref 2.5–4.6)

## 2018-11-04 LAB — TRIGLYCERIDES: Triglycerides: 386 mg/dL — ABNORMAL HIGH (ref <150)

## 2018-11-04 LAB — PROCALCITONIN: Procalcitonin: 7.7 ng/mL

## 2018-11-04 MED ORDER — MIDAZOLAM HCL (PF) 10 MG/2ML IJ SOLN: 8.0000 mg | Freq: Once | INTRAMUSCULAR | Status: AC

## 2018-11-04 MED ORDER — VANCOMYCIN HCL 1000 MG IV SOLR
1000.0000 mg | Freq: Two times a day (BID) | INTRAVENOUS | Status: DC
  Filled 2018-11-04: qty 1000

## 2018-11-04 MED ORDER — VANCOMYCIN HCL IN DEXTROSE 1-5 GM/200ML-% IV SOLN
1000.0000 mg | Freq: Two times a day (BID) | INTRAVENOUS | Status: DC
  Administered 2018-11-04 – 2018-11-09 (×10): 1000 mg via INTRAVENOUS
  Filled 2018-11-04 (×12): qty 200

## 2018-11-04 MED ORDER — ETOMIDATE 2 MG/ML IV SOLN
INTRAVENOUS | Status: AC
  Administered 2018-11-04: 20 mg
  Filled 2018-11-04: qty 10

## 2018-11-04 MED ORDER — SODIUM CHLORIDE 0.9 % IV SOLN
2.0000 g | Freq: Three times a day (TID) | INTRAVENOUS | Status: DC
  Administered 2018-11-04 – 2018-11-06 (×6): 2 g via INTRAVENOUS
  Filled 2018-11-04 (×9): qty 2

## 2018-11-04 MED ORDER — LABETALOL HCL 5 MG/ML IV SOLN
INTRAVENOUS | Status: AC
  Administered 2018-11-04: 20 mg
  Filled 2018-11-04: qty 4

## 2018-11-04 MED ORDER — ETOMIDATE 2 MG/ML IV SOLN
INTRAVENOUS | Status: AC
  Filled 2018-11-04: qty 10

## 2018-11-04 MED ORDER — POTASSIUM & SODIUM PHOSPHATES 280-160-250 MG PO PACK
1.0000 | PACK | Freq: Two times a day (BID) | ORAL | Status: AC
  Administered 2018-11-04 (×2): 1
  Filled 2018-11-04 (×2): qty 1

## 2018-11-04 MED ORDER — ROCURONIUM BROMIDE 50 MG/5ML IV SOLN
INTRAVENOUS | Status: AC
  Administered 2018-11-04: 10:00:00 50 mg
  Filled 2018-11-04: qty 1

## 2018-11-04 MED ORDER — MIDAZOLAM HCL 2 MG/2ML IJ SOLN
INTRAMUSCULAR | Status: AC
  Administered 2018-11-04: 2 mg via INTRAVENOUS
  Filled 2018-11-04: qty 8

## 2018-11-04 NOTE — Progress Notes (Signed)
Pharmacy Antibiotic Note  Cindy Morrison is a 63 y.o. female admitted on 11/01/2018 with sepsis.  Pharmacy has been consulted for vancomycin dosing.  Plan: Adjust Vancomycin 1000 mg IV Q 12 hrs. Goal AUC 400-550. Expected AUC: 525  SCr used: 0.8 (adj BW used for CrCl/Ke)  Cefepime 2g IV Q8hr.   Metronidazole 500mg  IV Q8hr.   Height: 5\' 6"  (167.6 cm) Weight: 174 lb 2.6 oz (79 kg) IBW/kg (Calculated) : 59.3  Temp (24hrs), Avg:100.5 F (38.1 C), Min:100 F (37.8 C), Max:100.9 F (38.3 C)  Recent Labs  Lab 11/01/18 0945 11/01/18 1024 11/01/18 1757 11/01/18 2043 11/02/18 0226 11/03/18 0449 11/03/18 1713 11/04/18 0440  WBC 13.7*  --  9.1  --  16.8* 4.6  --  4.7  CREATININE 1.04*  --  0.89 0.88 1.29* 0.74 0.68 0.69  LATICACIDVEN  --  1.9 1.4  --   --   --   --   --     Estimated Creatinine Clearance: 75.4 mL/min (by C-G formula based on SCr of 0.69 mg/dL).    Allergies  Allergen Reactions  . Erythromycin Base Swelling    Erythromycin ophthalmic ointment   3/30 Bcx: NG 3/30 resp virus panel: neg 3/30 MRSA PCR: neg   Thank you for allowing pharmacy to be a part of this patient's care.  Doreene Eland, PharmD, BCPS.   Work Cell: [phone] 11/04/2018 11:45 AM

## 2018-11-04 NOTE — Progress Notes (Signed)
Date of Admission:  11/01/2018      ID: Cindy Morrison is a 63 y.o. female  Active Problems:   Intractable nausea and vomiting   Septic shock (HCC)   Acute respiratory failure with hypoxia (HCC)    Subjective: Self extubated and had to be reintubated, had large volume aspiration  Medications:  . budesonide (PULMICORT) nebulizer solution  0.5 mg Nebulization BID  . chlorhexidine gluconate (MEDLINE KIT)  15 mL Mouth Rinse BID  . Chlorhexidine Gluconate Cloth  6 each Topical Daily  . enoxaparin (LOVENOX) injection  40 mg Subcutaneous Q24H  . famotidine  20 mg Per Tube Q12H  . feeding supplement (PRO-STAT SUGAR FREE 64)  30 mL Per Tube TID  . feeding supplement (VITAL 1.5 CAL)  1,000 mL Per Tube Q24H  . fentaNYL (SUBLIMAZE) injection  100 mcg Intravenous Once  . ipratropium-albuterol  3 mL Nebulization Q6H  . mouth rinse  15 mL Mouth Rinse 10 times per day  . polyethylene glycol  17 g Per Tube Q24H  . potassium & sodium phosphates  1 packet Per Tube Q12H  . senna-docusate  2 tablet Per Tube Q12H  . venlafaxine  75 mg Per NG tube BID WC    Objective: Vital signs in last 24 hours: Temp:  [98.8 F (37.1 C)-100.9 F (38.3 C)] 98.8 F (37.1 C) (04/01 1200) Pulse Rate:  [40-94] 65 (04/01 1200) Resp:  [11-32] 20 (04/01 1200) BP: (81-137)/(49-78) 118/68 (04/01 1200) SpO2:  [84 %-100 %] 98 % (04/01 1200) Arterial Line BP: (96-182)/(55-88) 144/70 (04/01 1200) FiO2 (%):  [35 %-100 %] 100 % (04/01 1357)  PHYSICAL EXAM:  General:intubated, sedated,  Lab Results Recent Labs    11/03/18 0449 11/03/18 1713 11/04/18 0440  WBC 4.6  --  4.7  HGB 10.0*  --  10.1*  HCT 29.4*  --  30.8*  NA 138 135 138  K 3.2* 3.4* 3.5  CL 95* 96* 102  CO2 33* 30 31  BUN '22 22 22  '$ CREATININE 0.74 0.68 0.69   Liver Panel No results for input(s): PROT, ALBUMIN, AST, ALT, ALKPHOS, BILITOT, BILIDIR, IBILI in the last 72 hours. Sedimentation Rate No results for input(s): ESRSEDRATE in the last 72  hours. C-Reactive Protein Recent Labs    11/02/18 1737  CRP 14.1*    Microbiology:  Studies/Results: Dg Chest Port 1 View  Result Date: 11/04/2018 CLINICAL DATA:  Rule out covid.  Intubated. EXAM: PORTABLE CHEST 1 VIEW COMPARISON:  11/04/2018 FINDINGS: Endotracheal tube and NG tube are unchanged. Heart is borderline in size. Mild vascular congestion. Mild interstitial prominence within the lungs. No visible effusions or acute bony abnormality. IMPRESSION: Peribronchial thickening and interstitial prominence. This could reflect edema or atypical/viral infection. Electronically Signed   By: Rolm Baptise M.D.   On: 11/04/2018 10:32   Dg Chest Port 1 View  Result Date: 11/04/2018 CLINICAL DATA:  Acute respiratory failure EXAM: PORTABLE CHEST 1 VIEW COMPARISON:  11/02/2018 FINDINGS: Support devices are unchanged. Cardiomegaly. Bilateral perihilar and lower lobe airspace opacities have increased since prior study. Layering effusions bilaterally also increased. No acute bony abnormality. IMPRESSION: Worsening bilateral airspace disease and layering effusions. Electronically Signed   By: Rolm Baptise M.D.   On: 11/04/2018 07:16   Dg Chest Port 1 View  Result Date: 11/02/2018 CLINICAL DATA:  Shortness of breath with hypotension and hypoxia. EXAM: PORTABLE CHEST 1 VIEW COMPARISON:  11/01/2018 FINDINGS: Endotracheal tube and enteric tube unchanged. Lungs are adequately inflated and demonstrate  hazy opacification over the right base slightly worse. Overall improvement in previously noted patchy bilateral density. No definite effusion. Remainder of the exam is unchanged. IMPRESSION: Interval improvement to near resolution in previously seen bilateral patchy density. Worsened mild hazy opacification over the right base which may be due to atelectasis or infection. Tubes and lines unchanged. Electronically Signed   By: Marin Olp M.D.   On: 11/02/2018 19:37   Dg Abd Portable 1v  Result Date:  11/04/2018 CLINICAL DATA:  63 year old female currently intubated with abdominal distension EXAM: PORTABLE ABDOMEN - 1 VIEW COMPARISON:  Abdominal radiograph 11/01/2018 FINDINGS: A nasogastric tube is present. The tip of the tube overlies the gastric body. The visualized lung bases are clear. No evidence of large free air on this supine series. The colon is diffusely distended with gas but not dilated. No significant gaseous distension of small bowel. A right femoral central venous catheter is present. The tip overlies the right common iliac vein. A second catheter is present more laterally, likely representing a arterial catheter within the superficial femoral artery. Lower lumbar degenerative disc disease. No acute osseous abnormality. IMPRESSION: 1. Marked gaseous distension of the colon without dilation. Findings are most suggestive of colonic ileus. 2. No evidence of small bowel obstruction. 3. The tip of the gastric tube overlies the gastric body. 4. Right femoral arterial and venous lines. Electronically Signed   By: Jacqulynn Cadet M.D.   On: 11/04/2018 10:32     Assessment/Plan: 63 yr female presenting with low back pain, some disorientation and found to be hypotensive, , hypoxic later and intubated, also has high procal , fever   Sepsis like picture- unclear etiology. No evidence of pneumonia or UTI Could she have Group A strep?? Does she have discitis/vertebral issues as she has low back pain Will need MRI of the lumbar area once she is extubated Continue vanco. Cefepime and flagyl  Hypoxic resp failure- intubated- self extubated this morning and had to be reintubated  Unlikely this is COVID  ?Has malignant melanoma of the left eye - being observed H/o sciatica and trochanteric bursitis  Discussed the management with the nurse and the pharmacist

## 2018-11-04 NOTE — Progress Notes (Signed)
During WUA this am, pt self extubated. Pt was extremely combative, attempting to hit, kick and climb out of bed. Pt was subsequently re-intubated, see MAR for RSI drugs. Pt is now comfortably sedated with propofol and fentanyl.

## 2018-11-04 NOTE — Progress Notes (Signed)
CRITICAL CARE NOTE       SUBJECTIVE FINDINGS & SIGNIFICANT EVENTS   Patient remains critically ill S/p self extubation event this am , then had large volume aspiration event and eventual reintubation.   PAST MEDICAL HISTORY   Past Medical History:  Diagnosis Date  . Anxiety   . Cancer ([place])    choroid melanoma left eye  . GERD (gastroesophageal reflux disease)   . Psoriasis      SURGICAL HISTORY   Past Surgical History:  Procedure Laterality Date  . ABDOMINAL HYSTERECTOMY    . BICEPT TENODESIS Right 12/18/2017   Procedure: BICEPS TENODESIS;  Surgeon: Leim Fabry, MD;  Location: [facility];  Service: Orthopedics;  Laterality: Right;  . BRAIN TUMOR EXCISION  2007  . COLONOSCOPY    . ESOPHAGOGASTRODUODENOSCOPY    . EYE SURGERY  2012  . MOHS SURGERY  2018  . SHOULDER ARTHROSCOPY WITH OPEN ROTATOR CUFF REPAIR Right 12/18/2017   Procedure: SHOULDER ARTHROSCOPY WITH OPEN ROTATOR CUFF REPAIR,DISTAL CLAVICLE EXCISION,SUBACROMIAL DECOMPRESSION;  Surgeon: Leim Fabry, MD;  Location: [facility];  Service: Orthopedics;  Laterality: Right;     FAMILY HISTORY   Family History  Problem Relation Age of Onset  . Heart attack Father   . Lung cancer Father      SOCIAL HISTORY   Social History   Tobacco Use  . Smoking status: Current Every Day Smoker    Packs/day: 1.00  . Smokeless tobacco: Never Used  Substance Use Topics  . Alcohol use: Yes    Frequency: Never    Comment: occasionally  . Drug use: Never     MEDICATIONS   Current Medication:  Current Facility-Administered Medications:  .  acetaminophen (TYLENOL) tablet 650 mg, 650 mg, Per Tube, Q6H PRN, 650 mg at 11/03/18 2153 **OR** acetaminophen (TYLENOL) suppository 650 mg, 650 mg, Rectal, Q6H PRN, Ottie Glazier, MD .  bisacodyl (DULCOLAX)  suppository 10 mg, 10 mg, Rectal, Daily PRN, Tukov-Yual, Magdalene S, NP .  budesonide (PULMICORT) nebulizer solution 0.5 mg, 0.5 mg, Nebulization, BID, Tukov-Yual, Magdalene S, NP, 0.5 mg at 11/04/18 0811 .  ceFEPIme (MAXIPIME) 2 g in sodium chloride 0.9 % 100 mL IVPB, 2 g, Intravenous, Q12H, Ravishankar, Jayashree, MD, Last Rate: 200 mL/hr at 11/04/18 0816, 2 g at 11/04/18 0816 .  chlorhexidine gluconate (MEDLINE KIT) (PERIDEX) 0.12 % solution 15 mL, 15 mL, Mouth Rinse, BID, Kasa, Kurian, MD, 15 mL at 11/03/18 2000 .  Chlorhexidine Gluconate Cloth 2 % PADS 6 each, 6 each, Topical, Daily, Flora Lipps, MD, 6 each at 11/03/18 [id number] .  dexmedetomidine (PRECEDEX) 400 MCG/100ML (4 mcg/mL) infusion, 0.4-1.2 mcg/kg/hr, Intravenous, Titrated, , , MD, Last Rate: 23.7 mL/hr at 11/04/18 0454, 1.2 mcg/kg/hr at 11/04/18 0454 .  enoxaparin (LOVENOX) injection 40 mg, 40 mg, Subcutaneous, Q24H, Lanney Gins, , MD, 40 mg at 11/03/18 2151 .  famotidine (PEPCID) tablet 20 mg, 20 mg, Per Tube, Q12H, Charlett Nose, RPH, 20 mg at 11/03/18 2150 .  feeding supplement (PRO-STAT SUGAR FREE 64) liquid 30 mL, 30 mL, Per Tube, TID, Lanney Gins, , MD, 30 mL at 11/03/18 2151 .  feeding supplement (VITAL 1.5 CAL) liquid 1,000 mL, 1,000 mL, Per Tube, Q24H, , , MD, Last Rate: 40 mL/hr at 11/03/18 1140, 1,000 mL at 11/03/18 1140 .  fentaNYL (SUBLIMAZE) injection 100 mcg, 100 mcg, Intravenous, Once, Kasa, Kurian, MD .  fentaNYL 2556mg in NS 2521m(1093mml) infusion-PREMIX, 0-400 mcg/hr, Intravenous, Continuous, Kasa, Kurian, MD, Last Rate: 40 mL/hr at 11/04/18 0219, 400  mcg/hr at 11/04/18 0219 .  ipratropium-albuterol (DUONEB) 0.5-2.5 (3) MG/3ML nebulizer solution 3 mL, 3 mL, Nebulization, Q6H, Tukov-Yual, Magdalene S, NP, 3 mL at 11/04/18 0811 .  MEDLINE mouth rinse, 15 mL, Mouth Rinse, 10 times per day, Flora Lipps, MD, 15 mL at 11/04/18 0420 .  metroNIDAZOLE (FLAGYL) IVPB 500 mg, 500 mg,  Intravenous, Q8H, Ravishankar, Jayashree, MD, Last Rate: 100 mL/hr at 11/04/18 0321, 500 mg at 11/04/18 0321 .  midazolam (VERSED) injection 2 mg, 2 mg, Intravenous, Q4H PRN, Awilda Bill, NP, 2 mg at 11/04/18 0400 .  midazolam PF (VERSED) injection 8 mg, 8 mg, Intravenous, Once, , , MD .  norepinephrine (LEVOPHED) 16 mg in 262m premix infusion, 0-40 mcg/min, Intravenous, Titrated, Tukov-Yual, Magdalene S, NP, Last Rate: 1.88 mL/hr at 11/03/18 1712, 2 mcg/min at 11/03/18 1712 .  ondansetron (ZOFRAN) tablet 4 mg, 4 mg, Per NG tube, Q6H PRN **OR** ondansetron (ZOFRAN) injection 4 mg, 4 mg, Intravenous, Q6H PRN, Tukov-Yual, Magdalene S, NP .  polyethylene glycol (MIRALAX / GLYCOLAX) packet 17 g, 17 g, Oral, Daily PRN, Mayo, KPete Pelt MD .  polyethylene glycol (MIRALAX / GLYCOLAX) packet 17 g, 17 g, Per Tube, Q24H, , , MD .  potassium & sodium phosphates (PHOS-NAK) 2102-585-277MG packet 1 packet, 1 packet, Per Tube, Q12H, SCharlett Nose RPH .  promethazine (PHENERGAN) injection 12.5 mg, 12.5 mg, Intravenous, Q6H PRN, Mayo, KPete Pelt MD, 12.5 mg at 11/01/18 1247 .  propofol (DIPRIVAN) 1000 MG/100ML infusion, 0-50 mcg/kg/min, Intravenous, Continuous, Tukov-Yual, Magdalene S, NP, Stopped at 11/02/18 0810 .  senna-docusate (Senokot-S) tablet 2 tablet, 2 tablet, Per Tube, Q12H, AOttie Glazier MD, 2 tablet at 11/03/18 2152 .  sennosides (SENOKOT) 8.8 MG/5ML syrup 5 mL, 5 mL, Per Tube, BID PRN, Tukov-Yual, Magdalene S, NP, 5 mL at 11/02/18 1536 .  vancomycin (VANCOCIN) IVPB 1000 mg/200 mL premix, 1,000 mg, Intravenous, Q12H, , , MD .  vecuronium (NORCURON) injection 10 mg, 10 mg, Intravenous, Q1H PRN, Tukov-Yual, Magdalene S, NP .  venlafaxine (EFFEXOR) tablet 75 mg, 75 mg, Per NG tube, BID WC, Tukov-Yual, Magdalene S, NP, 75 mg at 11/03/18 2151    ALLERGIES   Erythromycin base    REVIEW OF SYSTEMS    Unable to obtain due to critically ill state   PHYSICAL EXAMINATION   Vitals:   11/04/18 0500 11/04/18 0600  BP: 110/72 (!) 81/60  Pulse: 61 61  Resp: 20 20  Temp: (!) 100.6 F (38.1 C) (!) 100.4 F (38 C)  SpO2: 95% 94%    GENERAL:RASS-1 HEAD: Normocephalic, atraumatic.  EYES: Pupils equal, round, reactive to light.  No scleral icterus.  MOUTH: Moist mucosal membrane. NECK: Supple. No thyromegaly. No nodules. No JVD.  PULMONARY: bilateral ronchorous breath sounds CARDIOVASCULAR: S1 and S2. Regular rate and rhythm. No murmurs, rubs, or gallops.  GASTROINTESTINAL: Soft, nontender, non-distended. No masses. Positive bowel sounds. No hepatosplenomegaly.  MUSCULOSKELETAL: No swelling, clubbing, or edema.  NEUROLOGIC: Mild distress due to acute illness SKIN:intact,warm,dry   LABS AND IMAGING       LAB RESULTS: Recent Labs  Lab 11/03/18 0449 11/03/18 1713 11/04/18 0440  NA 138 135 138  K 3.2* 3.4* 3.5  CL 95* 96* 102  CO2 33* 30 31  BUN _0 CREATININE 0.74 0.68 0.69  GLUCOSE 144* 159* 167*   Recent Labs  Lab 11/02/18 0226 11/03/18 0449 11/04/18 0440  HGB 11.7* 10.0* 10.1*  HCT 36.4 29.4* 30.8*  WBC 16.8* 4.6 4.7  PLT 136* 99* 140*     IMAGING RESULTS: Dg Chest Port 1 View  Result Date: 11/04/2018 CLINICAL DATA:  Acute respiratory failure EXAM: PORTABLE CHEST 1 VIEW COMPARISON:  11/02/2018 FINDINGS: Support devices are unchanged. Cardiomegaly. Bilateral perihilar and lower lobe airspace opacities have increased since prior study. Layering effusions bilaterally also increased. No acute bony abnormality. IMPRESSION: Worsening bilateral airspace disease and layering effusions. Electronically Signed   By: Rolm Baptise M.D.   On: 11/04/2018 07:16      11/03/18 - BILATERAL INTERSTITIAL PROMINENCE WITH RLL AND LLL INFILTRATE   TODAY CXR -BILATERAL AIRSPACE OPACITIES    ASSESSMENT AND PLAN    -Multidisciplinary rounds held today  Acute Hypoxic Respiratory Failure- MODERATE ARDS PaO2/FiO2 <200  -poss COVID r/o -s/p massive aspiration after self extubation and now reintubation -continue Full MV support -continue Bronchodilator Therapy -Wean Fio2 and PEEP as tolerated -will perform SAT/SBT when respiratory parameters are met    Acute gastroenteritis-RESOLVED -Possible COVID related - results pending   CARDIAC FAILURE- Demand ischemia with mild troponin elevation -Lasix as tolerated -follow up cardiac enzymes as indicated ICU monitoring  Renal Failure-resolved now -continue Foley Catheter-assess need daily   NEUROLOGY - intubated and sedated - minimal sedation to achieve a RASS goal: -1 Wake up assessment pending   ID- consult ordered-  Appreciate input - poss GAS vs lumbar spine related infectious culprit -continue IV abx as prescibed -follow up cultures  GI/Nutrition GI PROPHYLAXIS as indicated DIET-->TF's as tolerated Constipation protocol as indicated  ENDO - ICU hypoglycemic\Hyperglycemia protocol -check FSBS per protocol   ELECTROLYTES -follow labs as needed -replace as needed -pharmacy consultation   DVT/GI PRX ordered -SCDs  TRANSFUSIONS AS NEEDED MONITOR FSBS ASSESS the need for LABS as needed   Critical care provider statement:  Critical care time (minutes):35 Critical care time was exclusive of: Separately billable procedures and treating other patients Critical care was necessary to treat or prevent imminent or life-threatening deterioration of the following conditions:acute hypoxemic respiratory failure, COVID19 rule out, COPD, AKI, demand ischemia , acute gastroenteritis Critical care was time spent personally by me on the following activities: Development of treatment plan with patient or surrogate, discussions with consultants, evaluation of patient's response to treatment, examination of patient, obtaining history from patient or surrogate, ordering and performing treatments and interventions,  ordering and review of laboratory studies and re-evaluation of patient's condition. I assumed direction of critical care for this patient from another provider in my specialty: no   This document was prepared using Dragon voice recognition software and may include unintentional dictation errors.   This document was prepared using Dragon voice recognition software and may include unintentional dictation errors.    Ottie Glazier, M.D.  Division of [place]

## 2018-11-04 NOTE — Procedures (Signed)
  Endotracheal Intubation: Patient required placement of an artificial airway secondary to Respiratory Failure  Consent: Emergent.   Hand washing performed prior to starting the procedure.   Medications administered for sedation prior to procedure:  Midazolam 2, rocuronium 30   A time out procedure was called and correct patient, name, & ID confirmed. Needed supplies and equipment were assembled and checked to include ETT, 10 ml syringe, Glidescope, Mac and Miller blades, suction, oxygen and bag mask valve, end tidal CO2 monitor.   Patient was positioned to align the mouth and pharynx to facilitate visualization of the glottis.   Heart rate, SpO2 and blood pressure was continuously monitored during the procedure. Pre-oxygenation was conducted prior to intubation and endotracheal tube was placed through the vocal cords into the trachea.     The artificial airway was placed under direct visualization via glidescope route using a 7 ETT on the first attempt.  ETT was secured at 23 cm mark.  Placement was confirmed by auscuitation of lungs with good breath sounds bilaterally and no stomach sounds.  Condensation was noted on endotracheal tube.   Pulse ox 98%.  CO2 detector in place with appropriate color change.   Complications: patient had large volume aspiration that was pouring out of ETT tube despite suctioning. ETT was removed and anesthesia team was asked to come for next intubation with same aspirate noted post ETT which was eventually cleared via tracheal suctioning and subsequent rise in SpO2 achieved.    Chest radiograph ordered and pending.   Comments: OGT placed via glidescope.     Ottie Glazier, M.D.  Pulmonary & [place]

## 2018-11-05 ENCOUNTER — Inpatient Hospital Stay: Payer: Managed Care, Other (non HMO)

## 2018-11-05 LAB — GLUCOSE, CAPILLARY
Glucose-Capillary: 140 mg/dL — ABNORMAL HIGH (ref 70–99)
Glucose-Capillary: 114 mg/dL — ABNORMAL HIGH (ref 70–99)
Glucose-Capillary: 123 mg/dL — ABNORMAL HIGH (ref 70–99)
Glucose-Capillary: 123 mg/dL — ABNORMAL HIGH (ref 70–99)
Glucose-Capillary: 135 mg/dL — ABNORMAL HIGH (ref 70–99)
Glucose-Capillary: 117 mg/dL — ABNORMAL HIGH (ref 70–99)
Glucose-Capillary: 127 mg/dL — ABNORMAL HIGH (ref 70–99)

## 2018-11-05 LAB — BLOOD GAS, ARTERIAL
Acid-Base Excess: 6.3 mmol/L — ABNORMAL HIGH (ref 0.0–2.0)
Bicarbonate: 32.3 mmol/L — ABNORMAL HIGH (ref 20.0–28.0)
FIO2: 0.7
MECHVT: 450 mL
O2 Saturation: 96.5 %
PEEP: 5 cmH2O
Patient temperature: 37
RATE: 24 resp/min
pCO2 arterial: 51 mmHg — ABNORMAL HIGH (ref 32.0–48.0)
pH, Arterial: 7.41 (ref 7.350–7.450)
pO2, Arterial: 85 mmHg (ref 83.0–108.0)

## 2018-11-05 LAB — CBC
HCT: 30.9 % — ABNORMAL LOW (ref 36.0–46.0)
Hemoglobin: 10.3 g/dL — ABNORMAL LOW (ref 12.0–15.0)
MCH: 31.3 pg (ref 26.0–34.0)
MCHC: 33.3 g/dL (ref 30.0–36.0)
MCV: 93.9 fL (ref 80.0–100.0)
Platelets: 149 10*3/uL — ABNORMAL LOW (ref 150–400)
RBC: 3.29 MIL/uL — ABNORMAL LOW (ref 3.87–5.11)
RDW: 13.2 % (ref 11.5–15.5)
WBC: 6.8 10*3/uL (ref 4.0–10.5)
nRBC: 0 % (ref 0.0–0.2)

## 2018-11-05 LAB — BASIC METABOLIC PANEL
Anion gap: 7 (ref 5–15)
BUN: 16 mg/dL (ref 8–23)
CO2: 30 mmol/L (ref 22–32)
Calcium: 7.9 mg/dL — ABNORMAL LOW (ref 8.9–10.3)
Chloride: 103 mmol/L (ref 98–111)
Creatinine, Ser: 0.45 mg/dL (ref 0.44–1.00)
GFR calc Af Amer: >60 mL/min (ref >60)
GFR calc non Af Amer: >60 mL/min (ref >60)
Glucose, Bld: 133 mg/dL — ABNORMAL HIGH (ref 70–99)
Potassium: 3.1 mmol/L — ABNORMAL LOW (ref 3.5–5.1)
Sodium: 140 mmol/L (ref 135–145)

## 2018-11-05 LAB — PHOSPHORUS: Phosphorus: 3.4 mg/dL (ref 2.5–4.6)

## 2018-11-05 LAB — MAGNESIUM: Magnesium: 1.8 mg/dL (ref 1.7–2.4)

## 2018-11-05 MED ORDER — SODIUM CHLORIDE 0.9 % IV SOLN
Freq: Once | INTRAVENOUS | Status: AC
  Administered 2018-11-05: 06:00:00 via INTRAVENOUS
  Filled 2018-11-05: qty 15

## 2018-11-05 MED ORDER — PROPOFOL 1000 MG/100ML IV EMUL
0.0000 ug/kg/min | INTRAVENOUS | Status: DC
  Administered 2018-11-05 (×2): 40 ug/kg/min via INTRAVENOUS
  Administered 2018-11-05: 5 ug/kg/min via INTRAVENOUS
  Administered 2018-11-06: 50 ug/kg/min via INTRAVENOUS
  Administered 2018-11-06: 50.075 ug/kg/min via INTRAVENOUS
  Administered 2018-11-06 (×3): 50 ug/kg/min via INTRAVENOUS
  Administered 2018-11-06: 45 ug/kg/min via INTRAVENOUS
  Administered 2018-11-07 (×2): 50 ug/kg/min via INTRAVENOUS
  Administered 2018-11-07 (×2): 50.075 ug/kg/min via INTRAVENOUS
  Administered 2018-11-07 – 2018-11-08 (×5): 50 ug/kg/min via INTRAVENOUS
  Administered 2018-11-08 (×2): 50.075 ug/kg/min via INTRAVENOUS
  Administered 2018-11-08 – 2018-11-09 (×6): 50 ug/kg/min via INTRAVENOUS
  Administered 2018-11-09: 40 ug/kg/min via INTRAVENOUS
  Administered 2018-11-09 – 2018-11-10 (×5): 50 ug/kg/min via INTRAVENOUS
  Administered 2018-11-10: 50.075 ug/kg/min via INTRAVENOUS
  Administered 2018-11-10 (×2): 50 ug/kg/min via INTRAVENOUS
  Administered 2018-11-10: 40 ug/kg/min via INTRAVENOUS
  Administered 2018-11-11: 28.027 ug/kg/min via INTRAVENOUS
  Administered 2018-11-11: 50 ug/kg/min via INTRAVENOUS
  Filled 2018-11-05 (×37): qty 100

## 2018-11-05 MED ORDER — MAGNESIUM SULFATE 2 GM/50ML IV SOLN
2.0000 g | Freq: Once | INTRAVENOUS | Status: AC
  Administered 2018-11-05: 2 g via INTRAVENOUS
  Filled 2018-11-05: qty 50

## 2018-11-05 MED ORDER — SODIUM CHLORIDE 0.9 % IV SOLN
0.5000 mg/h | INTRAVENOUS | Status: DC
  Administered 2018-11-05: 3 mg/h via INTRAVENOUS
  Filled 2018-11-05: qty 10

## 2018-11-05 MED ORDER — FAMOTIDINE IN NACL 20-0.9 MG/50ML-% IV SOLN
20.0000 mg | Freq: Two times a day (BID) | INTRAVENOUS | Status: DC
  Administered 2018-11-05 – 2018-11-10 (×10): 20 mg via INTRAVENOUS
  Filled 2018-11-05 (×10): qty 50

## 2018-11-05 MED ORDER — POTASSIUM CHLORIDE 20 MEQ PO PACK
40.0000 meq | PACK | Freq: Once | ORAL | Status: AC
  Administered 2018-11-05: 40 meq
  Filled 2018-11-05: qty 2

## 2018-11-05 MED ORDER — NICOTINE 21 MG/24HR TD PT24
21.0000 mg | MEDICATED_PATCH | Freq: Every day | TRANSDERMAL | Status: DC
  Administered 2018-11-05 – 2018-11-10 (×6): 21 mg via TRANSDERMAL
  Filled 2018-11-05 (×7): qty 1

## 2018-11-05 MED ORDER — SODIUM CHLORIDE 0.9 % IV SOLN
Freq: Once | INTRAVENOUS | Status: DC
  Filled 2018-11-05: qty 15

## 2018-11-05 NOTE — Progress Notes (Signed)
[place] [facility] [place] NAME: Cindy Morrison    MR#:  [id number]  DATE OF BIRTH:  [birth year]  SUBJECTIVE:   Remains intubated and sedated. Self-extubated and was re-intubated yesterday.  REVIEW OF SYSTEMS:  ROS- intubated and sedated  DRUG ALLERGIES:   Allergies  Allergen Reactions  . Erythromycin Base Swelling    Erythromycin ophthalmic ointment   VITALS:  Blood pressure 133/73, pulse 66, temperature 99 F (37.2 C), resp. rate (!) 24, height 5\' 6"  (1.676 m), weight 89.2 kg, SpO2 95 %. PHYSICAL EXAMINATION:  Physical Exam  GENERAL:  63 y.o.-year-old patient lying in the bed with no acute distress.  EYES: Pupils equal, round, reactive to light and accommodation. No scleral icterus. Extraocular muscles intact.  HEENT: Head atraumatic, normocephalic. Oropharynx and nasopharynx clear. ETT in place. NECK:  Supple, no jugular venous distention. No thyroid enlargement, no tenderness.  LUNGS: +coarse breath sounds throughout all lung fields, no wheezing, rales,rhonchi or crepitation. No use of accessory muscles of respiration.  CARDIOVASCULAR: Tachycardic, regular rhythm, S1, S2 normal. No murmurs, rubs, or gallops.  ABDOMEN: Soft, nontender, nondistended. Bowel sounds present. No organomegaly or mass.  EXTREMITIES: No pedal edema, cyanosis, or clubbing.  NEUROLOGIC: unable to assess- intubated and sedated PSYCHIATRIC: unable to assess SKIN: No obvious rash, lesion, or ulcer.  LABORATORY PANEL:  Female CBC Recent Labs  Lab 11/05/18 0354  WBC 6.8  HGB 10.3*  HCT 30.9*  PLT 149*   ------------------------------------------------------------------------------------------------------------------ Chemistries  Recent Labs  Lab 11/01/18 0945  11/05/18 0354  NA 129*   < > 140  K 3.7   < > 3.1*  CL 94*   < > 103  CO2 21*   < > 30  GLUCOSE 164*   < > 133*  BUN 21   < > 16  CREATININE 1.04*   < > 0.45  CALCIUM 8.4*   < > 7.9*  MG  --     < > 1.8  AST 31  --   --   ALT 28  --   --   ALKPHOS 50  --   --   BILITOT 0.4  --   --    < > = values in this interval not displayed.   RADIOLOGY:  Dg Chest Port 1 View  Result Date: 11/05/2018 CLINICAL DATA:  Acute respiratory failure EXAM: PORTABLE CHEST 1 VIEW COMPARISON:  Yesterday FINDINGS: Endotracheal tube tip at the clavicular heads. The orogastric tube tip reaches the stomach. Increased hazy opacification of the bilateral chest with pleural fluid seen at the bases. Normal heart size. No pneumothorax. IMPRESSION: 1. Stable hardware positioning. 2. History of large volume aspiration with increased chest opacification including pleural fluid. Electronically Signed   By: Monte Fantasia M.D.   On: 11/05/2018 04:47   ASSESSMENT AND PLAN:   Acute hypoxic respiratory failure and ARDS- self-extubated and was reintubated yesterday. -vent management per CCM -COVID testing is negative  Sepsis- unclear etiology. No signs of UTI or pneumonia. -ID consulted- recommending MRI spine when extubated -continue broad spectrum antibiotics  Acute gastroenteritis- resolved. No additional episodes of vomiting.  Elevated troponin- likely demand ischemia. Monitor.  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 Management plans discussed with the patient, family and they are in agreement.  CODE STATUS: Full Code  TOTAL TIME TAKING CARE OF THIS PATIENT: 45 minutes.   More than 50% of the  time was spent in counseling/coordination of care: YES  DISCHARGE UNKNOWN, DEPENDING ON CLINICAL CONDITION.   Berna Spare Gearl Kimbrough M.D on 11/05/2018 at 2:49 PM  Between 7am to 6pm - Pager [phone]  After 6pm go to [email or web address] - Proofreader  [facility]  Office  [phone]  CC: Primary care physician; Katheren Shams  Note: This dictation was prepared with Dragon dictation along with  smaller phrase technology. Any transcriptional errors that result from this process are unintentional.

## 2018-11-05 NOTE — Progress Notes (Signed)
CRITICAL CARE NOTE         SUBJECTIVE FINDINGS & SIGNIFICANT EVENTS   Patient remains critically ill, minimal levophed requirement,  Decreased FiO2 to 40%    PAST MEDICAL HISTORY   Past Medical History:  Diagnosis Date   Anxiety    Cancer ([place])    choroid melanoma left eye   GERD (gastroesophageal reflux disease)    Psoriasis      SURGICAL HISTORY   Past Surgical History:  Procedure Laterality Date   ABDOMINAL HYSTERECTOMY     BICEPT TENODESIS Right 12/18/2017   Procedure: BICEPS TENODESIS;  Surgeon: Leim Fabry, MD;  Location: [facility] ORS;  Service: Orthopedics;  Laterality: Right;   BRAIN TUMOR EXCISION  2007   COLONOSCOPY     ESOPHAGOGASTRODUODENOSCOPY     EYE SURGERY  2012   MOHS SURGERY  2018   SHOULDER ARTHROSCOPY WITH OPEN ROTATOR CUFF REPAIR Right 12/18/2017   Procedure: SHOULDER ARTHROSCOPY WITH OPEN ROTATOR CUFF REPAIR,DISTAL CLAVICLE EXCISION,SUBACROMIAL DECOMPRESSION;  Surgeon: Leim Fabry, MD;  Location: [facility] ORS;  Service: Orthopedics;  Laterality: Right;     FAMILY HISTORY   Family History  Problem Relation Age of Onset   Heart attack Father    Lung cancer Father      SOCIAL HISTORY   Social History   Tobacco Use   Smoking status: Current Every Day Smoker    Packs/day: 1.00   Smokeless tobacco: Never Used  Substance Use Topics   Alcohol use: Yes    Frequency: Never    Comment: occasionally   Drug use: Never     MEDICATIONS   Current Medication:  Current Facility-Administered Medications:    acetaminophen (TYLENOL) tablet 650 mg, 650 mg, Per Tube, Q6H PRN, 650 mg at 11/05/18 0852 **OR** acetaminophen (TYLENOL) suppository 650 mg, 650 mg, Rectal, Q6H PRN, Ottie Glazier, MD   bisacodyl (DULCOLAX) suppository 10 mg, 10 mg, Rectal, Daily PRN,  Tukov-Yual, Magdalene S, NP   budesonide (PULMICORT) nebulizer solution 0.5 mg, 0.5 mg, Nebulization, BID, Tukov-Yual, Magdalene S, NP, 0.5 mg at 11/04/18 2034   ceFEPIme (MAXIPIME) 2 g in sodium chloride 0.9 % 100 mL IVPB, 2 g, Intravenous, Q8H, Berton Mount, RPH, Stopped at 11/05/18 0333   chlorhexidine gluconate (MEDLINE KIT) (PERIDEX) 0.12 % solution 15 mL, 15 mL, Mouth Rinse, BID, Kasa, Kurian, MD, 15 mL at 11/05/18 0850   Chlorhexidine Gluconate Cloth 2 % PADS 6 each, 6 each, Topical, Daily, Kasa, Kurian, MD, 6 each at 11/05/18 0906   dexmedetomidine (PRECEDEX) 400 MCG/100ML (4 mcg/mL) infusion, 0.4-1.2 mcg/kg/hr, Intravenous, Titrated, Jaquese Irving, MD, Last Rate: 23.7 mL/hr at 11/05/18 0655, 1.2 mcg/kg/hr at 11/05/18 0655   enoxaparin (LOVENOX) injection 40 mg, 40 mg, Subcutaneous, Q24H, Emiliya Chretien, MD, 40 mg at 11/04/18 1943   famotidine (PEPCID) tablet 20 mg, 20 mg, Per Tube, Q12H, Charlett Nose, RPH, 20 mg at 11/05/18 0852   feeding supplement (PRO-STAT SUGAR FREE 64) liquid 30 mL, 30 mL, Per Tube, TID, Lanney Gins, Debe Anfinson, MD, 30 mL at 11/05/18 0853   feeding supplement (VITAL 1.5 CAL) liquid 1,000 mL, 1,000 mL, Per Tube, Q24H, Avonlea Sima, MD, Stopped at 11/04/18 0840   fentaNYL (SUBLIMAZE) injection 100 mcg, 100 mcg, Intravenous, Once, Kasa, Kurian, MD   fentaNYL 2535mg in NS 2538m(10104mml) infusion-PREMIX, 0-400 mcg/hr, Intravenous, Continuous, Kasa, Kurian, MD, Last Rate: 37.5 mL/hr at 11/05/18 0655, 375 mcg/hr at 11/05/18 0655   ipratropium-albuterol (DUONEB) 0.5-2.5 (3) MG/3ML nebulizer solution 3 mL, 3 mL, Nebulization, Q6H, Tukov-Yual,  Arlyss Gandy, NP, 3 mL at 11/05/18 0229   MEDLINE mouth rinse, 15 mL, Mouth Rinse, 10 times per day, Flora Lipps, MD, 15 mL at 11/05/18 0851   metroNIDAZOLE (FLAGYL) IVPB 500 mg, 500 mg, Intravenous, Q8H, Ravishankar, Joellyn Quails, MD, Stopped at 11/05/18 0450   midazolam (VERSED) 50 mg in sodium chloride 0.9 % 50 mL  (1 mg/mL) infusion, 0.5-4 mg/hr, Intravenous, Continuous, Darel Hong D, NP, Last Rate: 4 mL/hr at 11/05/18 0655, 4 mg/hr at 11/05/18 0655   midazolam (VERSED) injection 2 mg, 2 mg, Intravenous, Q4H PRN, Awilda Bill, NP, 2 mg at 11/05/18 0303   norepinephrine (LEVOPHED) 16 mg in 249m premix infusion, 0-40 mcg/min, Intravenous, Titrated, Tukov-Yual, Magdalene S, NP, Last Rate: 1.88 mL/hr at 11/05/18 0655, 2 mcg/min at 11/05/18 0655   ondansetron (ZOFRAN) tablet 4 mg, 4 mg, Per NG tube, Q6H PRN **OR** ondansetron (ZOFRAN) injection 4 mg, 4 mg, Intravenous, Q6H PRN, Tukov-Yual, Magdalene S, NP   polyethylene glycol (MIRALAX / GLYCOLAX) packet 17 g, 17 g, Oral, Daily PRN, Mayo, KPete Pelt MD   polyethylene glycol (MIRALAX / GLYCOLAX) packet 17 g, 17 g, Per Tube, Q24H, ALanney Gins Royce Stegman, MD, 17 g at 11/05/18 0852   promethazine (PHENERGAN) injection 12.5 mg, 12.5 mg, Intravenous, Q6H PRN, Mayo, KPete Pelt MD, 12.5 mg at 11/01/18 1247   senna-docusate (Senokot-S) tablet 2 tablet, 2 tablet, Per Tube, Q12H, AOttie Glazier MD, 2 tablet at 11/05/18 0852   sennosides (SENOKOT) 8.8 MG/5ML syrup 5 mL, 5 mL, Per Tube, BID PRN, Tukov-Yual, Magdalene S, NP, 5 mL at 11/02/18 1536   vancomycin (VANCOCIN) IVPB 1000 mg/200 mL premix, 1,000 mg, Intravenous, Q12H, AOttie Glazier MD, Last Rate: 200 mL/hr at 11/05/18 0854, 1,000 mg at 11/05/18 0854   vecuronium (NORCURON) injection 10 mg, 10 mg, Intravenous, Q1H PRN, Tukov-Yual, Magdalene S, NP, 10 mg at 11/05/18 0645   venlafaxine (EFFEXOR) tablet 75 mg, 75 mg, Per NG tube, BID WC, Tukov-Yual, Magdalene S, NP, 75 mg at 11/05/18 0853    ALLERGIES   Erythromycin base    REVIEW OF SYSTEMS     ROS unable to conduct due to RASS-1 on mechanical ventilation  PHYSICAL EXAMINATION   Vitals:   11/05/18 0630 11/05/18 0645  BP: (!) 87/55 93/62  Pulse: 66 75  Resp: (!) 23 14  Temp: 99.9 F (37.7 C) 99.9 F (37.7 C)  SpO2: 97% 99%     GENERAL:Rass-2 HEAD: Normocephalic, atraumatic.  EYES: Pupils equal, round, reactive to light.  No scleral icterus.  MOUTH: Moist mucosal membrane. NECK: Supple. No thyromegaly. No nodules. No JVD.  PULMONARY: rhonchi bilaterally  CARDIOVASCULAR: S1 and S2. Regular rate and rhythm. No murmurs, rubs, or gallops.  GASTROINTESTINAL: Soft, nontender, non-distended. No masses. Positive bowel sounds. No hepatosplenomegaly.  MUSCULOSKELETAL: No swelling, clubbing, or edema.  NEUROLOGIC: Mild distress due to acute illness SKIN:intact,warm,dry   LABS AND IMAGING     LAB RESULTS: Recent Labs  Lab 11/03/18 1713 11/04/18 0440 11/05/18 0354  NA 135 138 140  K 3.4* 3.5 3.1*  CL 96* 102 103  CO2 '30 31 30  '$ BUN '22 22 16  '$ CREATININE 0.68 0.69 0.45  GLUCOSE 159* 167* 133*   Recent Labs  Lab 11/03/18 0449 11/04/18 0440 11/05/18 0354  HGB 10.0* 10.1* 10.3*  HCT 29.4* 30.8* 30.9*  WBC 4.6 4.7 6.8  PLT 99* 140* 149*     IMAGING RESULTS: Dg Chest Port 1 View  Result Date: 11/05/2018 CLINICAL DATA:  Acute respiratory failure EXAM:  PORTABLE CHEST 1 VIEW COMPARISON:  Yesterday FINDINGS: Endotracheal tube tip at the clavicular heads. The orogastric tube tip reaches the stomach. Increased hazy opacification of the bilateral chest with pleural fluid seen at the bases. Normal heart size. No pneumothorax. IMPRESSION: 1. Stable hardware positioning. 2. History of large volume aspiration with increased chest opacification including pleural fluid. Electronically Signed   By: Monte Fantasia M.D.   On: 11/05/2018 04:47   Dg Chest Port 1 View  Result Date: 11/04/2018 CLINICAL DATA:  Rule out covid.  Intubated. EXAM: PORTABLE CHEST 1 VIEW COMPARISON:  11/04/2018 FINDINGS: Endotracheal tube and NG tube are unchanged. Heart is borderline in size. Mild vascular congestion. Mild interstitial prominence within the lungs. No visible effusions or acute bony abnormality. IMPRESSION: Peribronchial thickening  and interstitial prominence. This could reflect edema or atypical/viral infection. Electronically Signed   By: Rolm Baptise M.D.   On: 11/04/2018 10:32   Dg Abd Portable 1v  Result Date: 11/04/2018 CLINICAL DATA:  63 year old female currently intubated with abdominal distension EXAM: PORTABLE ABDOMEN - 1 VIEW COMPARISON:  Abdominal radiograph 11/01/2018 FINDINGS: A nasogastric tube is present. The tip of the tube overlies the gastric body. The visualized lung bases are clear. No evidence of large free air on this supine series. The colon is diffusely distended with gas but not dilated. No significant gaseous distension of small bowel. A right femoral central venous catheter is present. The tip overlies the right common iliac vein. A second catheter is present more laterally, likely representing a arterial catheter within the superficial femoral artery. Lower lumbar degenerative disc disease. No acute osseous abnormality. IMPRESSION: 1. Marked gaseous distension of the colon without dilation. Findings are most suggestive of colonic ileus. 2. No evidence of small bowel obstruction. 3. The tip of the gastric tube overlies the gastric body. 4. Right femoral arterial and venous lines. Electronically Signed   By: Jacqulynn Cadet M.D.   On: 11/04/2018 10:32      ASSESSMENT AND PLAN     -Multidisciplinary rounds held today  Acute Hypoxic Respiratory Failure- MODERATE ARDS PaO2/FiO2 <200 -poss COVID r/o -decreased SpO2 to <60% -continue Full MV support -continue Bronchodilator Therapy -Wean Fio2 and PEEP as tolerated -will perform SAT/SBT when respiratory parameters are met    Acute gastroenteritis-RESOLVED -Possible COVID related - results pending   CARDIAC FAILURE- Demand ischemia with mild troponin elevation -Lasix as tolerated -follow up cardiac enzymes as indicated ICU monitoring  Renal Failure-resolved now -continue Foley Catheter-assess need daily   NEUROLOGY -  intubated and sedated - minimal sedation to achieve a RASS goal: -1 Wake up assessment pending   ID- consult ordered- Appreciate input - poss GAS vs lumbar spine related infectious culprit -continue IV abx as prescibed -follow up cultures  GI/Nutrition GI PROPHYLAXIS as indicated DIET-->TF's as tolerated Constipation protocol as indicated  ENDO - ICU hypoglycemic\Hyperglycemia protocol -check FSBS per protocol   ELECTROLYTES -follow labs as needed -replace as needed -pharmacy consultation   DVT/GI PRX ordered -SCDs  TRANSFUSIONS AS NEEDED MONITOR FSBS ASSESS the need for LABS as needed   Critical care provider statement:  Critical care time (minutes):106 Critical care time was exclusive of: Separately billable procedures and treating other patients Critical care was necessary to treat or prevent imminent or life-threatening deterioration of the following conditions:acute hypoxemic respiratory failure, COVID19 rule out, COPD, AKI, demand ischemia , acute gastroenteritis Critical care was time spent personally by me on the following activities: Development of treatment plan with patient or surrogate,  discussions with consultants, evaluation of patient's response to treatment, examination of patient, obtaining history from patient or surrogate, ordering and performing treatments and interventions, ordering and review of laboratory studies and re-evaluation of patient's condition. I assumed direction of critical care for this patient from another provider in my specialty: no   This document was prepared using Dragon voice recognition software and may include unintentional dictation errors.  Ottie Glazier, M.D.  Division of [place]

## 2018-11-05 NOTE — Progress Notes (Addendum)
Pt with periods of agitation this shift, kicking legs and reaching for ETT,  not able to verbally redirect or calm her.  She visually tracks minimally but follows no commands. Per Dr Lanney Gins Versed was stopped and Propofol was restarted.  Also obtained order for nicotine patches as she is a heavy smoker per her husband.  Breath sounds very coarse, wheezing intermittently auscultated.  MAP maintained in 70s and 80s with 2 mcgs levophed.  Pt requires relatively large amt continuous sedation to remain synchronous with the ventilator.  FIO2 reduced to 40% this shift.  Unable to DC 4 point restraints.  Tolerated bath fairly well but did require increased sedation afterwards d/t asynchronous.  OG to LIS approx 1130, thick Darus Hershman GI output observed.

## 2018-11-05 NOTE — Progress Notes (Signed)
Pharmacy Electrolyte Monitoring Consult:  Pharmacy consulted to assist in monitoring and replacing electrolytes in this 63 y.o. female admitted on 11/01/2018 with Emesis   Labs:  Sodium (mmol/L)  Date Value  11/05/2018 140  03/16/2014 136   Potassium (mmol/L)  Date Value  11/05/2018 3.1 (L)  03/16/2014 3.8   Magnesium (mg/dL)  Date Value  11/05/2018 1.8   Phosphorus (mg/dL)  Date Value  11/05/2018 3.4   Calcium (mg/dL)  Date Value  11/05/2018 7.9 (L)   Calcium, Total (mg/dL)  Date Value  03/16/2014 9.0   Albumin (g/dL)  Date Value  11/01/2018 3.7  03/16/2014 3.8    Assessment/Plan: 4/2 K: 3.1. Will order Magnesium 2g IV x 1 dose,  KCL 6mEq IV x 1 dose and KCL 68mEq x1 per tube.   Will replace for goal potassium ~ 4, goal magnesium ~ 2, and phosphate > 2.5.   Will obtain electrolytes with am labs.   Pharmacy will continue to monitor and adjust per consult.    Pernell Dupre, PharmD, BCPS Clinical Pharmacist 11/05/2018 5:30 AM

## 2018-11-05 NOTE — Plan of Care (Signed)
Patient's husband updated on POC and progess, Pt turned Q2H and bathed, ROM completed.

## 2018-11-06 ENCOUNTER — Inpatient Hospital Stay: Payer: Managed Care, Other (non HMO)

## 2018-11-06 DIAGNOSIS — F17200 Nicotine dependence, unspecified, uncomplicated: Secondary | ICD-10-CM

## 2018-11-06 DIAGNOSIS — M5440 Lumbago with sciatica, unspecified side: Secondary | ICD-10-CM

## 2018-11-06 DIAGNOSIS — R112 Nausea with vomiting, unspecified: Secondary | ICD-10-CM

## 2018-11-06 DIAGNOSIS — R451 Restlessness and agitation: Secondary | ICD-10-CM

## 2018-11-06 LAB — GLUCOSE, CAPILLARY
Glucose-Capillary: 105 mg/dL — ABNORMAL HIGH (ref 70–99)
Glucose-Capillary: 107 mg/dL — ABNORMAL HIGH (ref 70–99)
Glucose-Capillary: 121 mg/dL — ABNORMAL HIGH (ref 70–99)
Glucose-Capillary: 124 mg/dL — ABNORMAL HIGH (ref 70–99)
Glucose-Capillary: 127 mg/dL — ABNORMAL HIGH (ref 70–99)

## 2018-11-06 LAB — HEPATIC FUNCTION PANEL
ALT: 42 U/L (ref 0–44)
AST: 31 U/L (ref 15–41)
Albumin: 1.9 g/dL — ABNORMAL LOW (ref 3.5–5.0)
Alkaline Phosphatase: 41 U/L (ref 38–126)
Bilirubin, Direct: 0.2 mg/dL (ref 0.0–0.2)
Indirect Bilirubin: 0.5 mg/dL (ref 0.3–0.9)
Total Bilirubin: 0.7 mg/dL (ref 0.3–1.2)
Total Protein: 4.8 g/dL — ABNORMAL LOW (ref 6.5–8.1)

## 2018-11-06 LAB — CBC
HCT: 30.9 % — ABNORMAL LOW (ref 36.0–46.0)
Hemoglobin: 10.1 g/dL — ABNORMAL LOW (ref 12.0–15.0)
MCH: 31.1 pg (ref 26.0–34.0)
MCHC: 32.7 g/dL (ref 30.0–36.0)
MCV: 95.1 fL (ref 80.0–100.0)
Platelets: 183 10*3/uL (ref 150–400)
RBC: 3.25 MIL/uL — ABNORMAL LOW (ref 3.87–5.11)
RDW: 13.5 % (ref 11.5–15.5)
WBC: 7.7 10*3/uL (ref 4.0–10.5)
nRBC: 0 % (ref 0.0–0.2)

## 2018-11-06 LAB — BASIC METABOLIC PANEL
Anion gap: 7 (ref 5–15)
BUN: 12 mg/dL (ref 8–23)
CO2: 31 mmol/L (ref 22–32)
Calcium: 7.7 mg/dL — ABNORMAL LOW (ref 8.9–10.3)
Chloride: 102 mmol/L (ref 98–111)
Creatinine, Ser: 0.45 mg/dL (ref 0.44–1.00)
GFR calc Af Amer: >60 mL/min (ref >60)
GFR calc non Af Amer: >60 mL/min (ref >60)
Glucose, Bld: 117 mg/dL — ABNORMAL HIGH (ref 70–99)
Potassium: 3.4 mmol/L — ABNORMAL LOW (ref 3.5–5.1)
Sodium: 140 mmol/L (ref 135–145)

## 2018-11-06 LAB — MAGNESIUM: Magnesium: 2 mg/dL (ref 1.7–2.4)

## 2018-11-06 LAB — PHOSPHORUS: Phosphorus: 2.4 mg/dL — ABNORMAL LOW (ref 2.5–4.6)

## 2018-11-06 MED ORDER — LEVOFLOXACIN IN D5W 750 MG/150ML IV SOLN
750.0000 mg | INTRAVENOUS | Status: DC
  Administered 2018-11-07 – 2018-11-11 (×5): 750 mg via INTRAVENOUS
  Filled 2018-11-06 (×6): qty 150

## 2018-11-06 MED ORDER — MIDAZOLAM HCL 2 MG/2ML IJ SOLN
2.0000 mg | INTRAMUSCULAR | Status: AC
  Administered 2018-11-06: 2 mg via INTRAVENOUS

## 2018-11-06 MED ORDER — DEXMEDETOMIDINE HCL IN NACL 400 MCG/100ML IV SOLN
0.0000 ug/kg/h | INTRAVENOUS | Status: DC
  Administered 2018-11-06: 1 ug/kg/h via INTRAVENOUS
  Administered 2018-11-07 – 2018-11-09 (×13): 1.2 ug/kg/h via INTRAVENOUS
  Administered 2018-11-09: 1.21 ug/kg/h via INTRAVENOUS
  Administered 2018-11-09 – 2018-11-10 (×5): 1.2 ug/kg/h via INTRAVENOUS
  Administered 2018-11-10: 1 ug/kg/h via INTRAVENOUS
  Administered 2018-11-10: 1.2 ug/kg/h via INTRAVENOUS
  Administered 2018-11-10: 1 ug/kg/h via INTRAVENOUS
  Administered 2018-11-10 – 2018-11-11 (×6): 1.2 ug/kg/h via INTRAVENOUS
  Administered 2018-11-11: 1 ug/kg/h via INTRAVENOUS
  Administered 2018-11-11: 1.2 ug/kg/h via INTRAVENOUS
  Administered 2018-11-11: 1 ug/kg/h via INTRAVENOUS
  Administered 2018-11-12 – 2018-11-13 (×11): 1.2 ug/kg/h via INTRAVENOUS
  Administered 2018-11-13: 1.8 ug/kg/h via INTRAVENOUS
  Administered 2018-11-13 (×2): 2 ug/kg/h via INTRAVENOUS
  Filled 2018-11-06 (×48): qty 100

## 2018-11-06 MED ORDER — GADOBUTROL 1 MMOL/ML IV SOLN
9.0000 mL | Freq: Once | INTRAVENOUS | Status: AC | PRN
  Administered 2018-11-06: 9 mL via INTRAVENOUS

## 2018-11-06 MED ORDER — FUROSEMIDE 10 MG/ML IJ SOLN
40.0000 mg | Freq: Every day | INTRAMUSCULAR | Status: DC
  Administered 2018-11-06 – 2018-11-08 (×3): 40 mg via INTRAVENOUS
  Filled 2018-11-06 (×3): qty 4

## 2018-11-06 MED ORDER — LABETALOL HCL 5 MG/ML IV SOLN: 10.0000 mg | Freq: Once | INTRAVENOUS | Status: AC

## 2018-11-06 MED ORDER — OXYCODONE HCL 5 MG PO TABS
5.0000 mg | ORAL_TABLET | Freq: Four times a day (QID) | ORAL | Status: DC
  Administered 2018-11-06 – 2018-11-07 (×3): 5 mg via ORAL
  Filled 2018-11-06 (×3): qty 1

## 2018-11-06 MED ORDER — MIDAZOLAM HCL 2 MG/2ML IJ SOLN
2.0000 mg | INTRAMUSCULAR | Status: DC | PRN
  Administered 2018-11-06 – 2018-11-08 (×6): 4 mg via INTRAVENOUS
  Administered 2018-11-08: 2 mg via INTRAVENOUS
  Administered 2018-11-09 – 2018-11-11 (×6): 4 mg via INTRAVENOUS
  Filled 2018-11-06 (×7): qty 4

## 2018-11-06 MED ORDER — LABETALOL HCL 5 MG/ML IV SOLN
INTRAVENOUS | Status: AC
  Administered 2018-11-06: 15:00:00
  Filled 2018-11-06: qty 4

## 2018-11-06 MED ORDER — LEVOFLOXACIN IN D5W 750 MG/150ML IV SOLN
750.0000 mg | INTRAVENOUS | Status: DC
  Administered 2018-11-06: 750 mg via INTRAVENOUS
  Filled 2018-11-06: qty 150

## 2018-11-06 MED ORDER — MIDAZOLAM 50MG/50ML (1MG/ML) PREMIX INFUSION
0.5000 mg/h | INTRAVENOUS | Status: DC
  Filled 2018-11-06: qty 50

## 2018-11-06 MED ORDER — PHENOBARBITAL SODIUM 130 MG/ML IJ SOLN
40.0000 mg | Freq: Three times a day (TID) | INTRAMUSCULAR | Status: DC
  Administered 2018-11-06 – 2018-11-07 (×3): 40 mg via INTRAVENOUS
  Filled 2018-11-06 (×3): qty 1

## 2018-11-06 MED ORDER — MIDAZOLAM HCL 2 MG/2ML IJ SOLN
2.0000 mg | Freq: Once | INTRAMUSCULAR | Status: AC
  Administered 2018-11-06: 2 mg via INTRAVENOUS

## 2018-11-06 MED ORDER — POTASSIUM CHLORIDE 10 MEQ/100ML IV SOLN
10.0000 meq | INTRAVENOUS | Status: AC
  Administered 2018-11-06 (×4): 10 meq via INTRAVENOUS
  Filled 2018-11-06 (×4): qty 100

## 2018-11-06 MED ORDER — ALBUMIN HUMAN 25 % IV SOLN
12.5000 g | Freq: Once | INTRAVENOUS | Status: AC
  Administered 2018-11-06: 12.5 g via INTRAVENOUS
  Filled 2018-11-06: qty 50

## 2018-11-06 MED ORDER — SODIUM CHLORIDE 0.9 % IV SOLN
0.5000 mg/h | INTRAVENOUS | Status: DC
  Filled 2018-11-06: qty 10

## 2018-11-06 NOTE — Progress Notes (Signed)
Pharmacy Electrolyte Monitoring Consult:  Pharmacy consulted to assist in monitoring and replacing electrolytes in this 63 y.o. female admitted on 11/01/2018 with Emesis   Labs:  Sodium (mmol/L)  Date Value  11/06/2018 140  03/16/2014 136   Potassium (mmol/L)  Date Value  11/06/2018 3.4 (L)  03/16/2014 3.8   Magnesium (mg/dL)  Date Value  11/06/2018 2.0   Phosphorus (mg/dL)  Date Value  11/06/2018 2.4 (L)   Calcium (mg/dL)  Date Value  11/06/2018 7.7 (L)   Calcium, Total (mg/dL)  Date Value  03/16/2014 9.0   Albumin (g/dL)  Date Value  11/01/2018 3.7  03/16/2014 3.8    Assessment/Plan: OG tube placed to suction on 4/2. Will order potassium 42mEq IV Q1hr x 4 doses.  Will replace for goal potassium ~ 4, goal magnesium ~ 2, and phosphate > 2.5.   Will obtain electrolytes with am labs.   Pharmacy will continue to monitor and adjust per consult.    Simpson,Michael L, 11/06/2018 6:35 AM

## 2018-11-06 NOTE — Progress Notes (Signed)
Date of Admission:  11/01/2018      ID: Cindy Morrison is a 63 y.o. female  Active Problems:   Intractable nausea and vomiting   Septic shock (HCC)   Acute respiratory failure with hypoxia [facility])  Cindy Morrison is a 63 y.o. with a history of choroidal malignant melanoma left eye , trochanteric bursitis rt hip, chronic low back pain was getting PT Was admitted on 3/29 with N/V  Spoke to her husband and according to him pt was doing well on Friday night. They had dinner and she went to bed/ On 3/28 ( Saturday) she was not feeling well and was lying in bed the whole time. She was c/o low back pain ( which she had for the past 6 months according to her husband) She vomited when she drank water. Her husband brought her in as she was feeling disoriented. In the ED on 3/29 initially she did not have any temperature BP was 105/67. She was worked up for any dissection of aorta and had CTA of the chest and abdomen and it was negative. She was intubated as she was hypoxic and hypercapnic. She was started on ceftriaxone, vanco, doxy. She was also tested for COVID. Started on pressor She is a current smoker and husband did not note any worse cough than baseline. 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 Saw her ophthalmologist at [facility] 2 weeks ago. She has not taken any recent antibiotics  3/29- Nausea and vomiting , back pain , got intubated- started on ceftriaxone, doxy vanco High fever 3./30 -  3/31 ID consult Antibiotic changed to cefepime+ flagyl + vanco 4/1 -self extubated, agitated vomited, reintubated 4/2 COVID negative 4/3 Bronch - thick secretions Continues to be agitated  No pressors      Subjective: Intubated Bronch done today- thick secretions Low grade fever MRI throacolumbar- only degenerative changes  Medications:  . budesonide (PULMICORT) nebulizer solution  0.5 mg Nebulization BID  . chlorhexidine gluconate (MEDLINE KIT)  15 mL  Mouth Rinse BID  . Chlorhexidine Gluconate Cloth  6 each Topical Daily  . enoxaparin (LOVENOX) injection  40 mg Subcutaneous Q24H  . fentaNYL (SUBLIMAZE) injection  100 mcg Intravenous Once  . ipratropium-albuterol  3 mL Nebulization Q6H  . mouth rinse  15 mL Mouth Rinse 10 times per day  . nicotine  21 mg Transdermal Daily  . polyethylene glycol  17 g Per Tube Q24H  . senna-docusate  2 tablet Per Tube Q12H  . venlafaxine  75 mg Per NG tube BID WC    Objective: Vital signs in last 24 hours: Temp:  [98.6 F (37 C)-100.9 F (38.3 C)] 100.2 F (37.9 C) (04/03 1000) Pulse Rate:  [59-85] 70 (04/03 1000) Resp:  [16-26] 26 (04/03 1000) BP: (106-147)/(58-100) 106/58 (04/03 0800) SpO2:  [90 %-97 %] 97 % (04/03 1000) Arterial Line BP: (111-163)/(50-74) 111/50 (04/03 1000) FiO2 (%):  [40 %-60 %] 60 % (04/03 0732) Weight:  [90 kg] 90 kg (04/03 0340)  PHYSICAL EXAM:  General: intubated, agitated Lungs:b/la ir entry Heart: s1s2 Abdomen: Soft, Extremities:some edema Skin: No rashes or lesions. Or bruising Neurologic: cannot be examined  Lab Results Recent Labs    11/05/18 0354 11/06/18 0357  WBC 6.8 7.7  HGB 10.3* 10.1*  HCT 30.9* 30.9*  NA 140 140  K 3.1* 3.4*  CL 103 102  CO2 30 31  BUN 16 12  CREATININE 0.45 0.45  Liver Panel No results for input(s): PROT, ALBUMIN, AST, ALT, ALKPHOS, BILITOT, BILIDIR, IBILI in the last 72 hours. Sedimentation Rate No results for input(s): ESRSEDRATE in the last 72 hours. C-Reactive Protein No results for input(s): CRP in the last 72 hours.  Microbiology:  Studies/Results: Dg Chest Port 1 View  Result Date: 11/06/2018 CLINICAL DATA:  Acute respiratory failure EXAM: PORTABLE CHEST 1 VIEW COMPARISON:  11/05/2018 FINDINGS: Endotracheal tube terminates 4.5 cm above the carina. Enteric tube courses into the stomach. Multifocal patchy opacities, right upper lobe predominant, compatible with multifocal pneumonia (reportedly on the basis of  aspiration, mildly progressive. Possible small left pleural effusion. No pneumothorax. The heart is normal in size. IMPRESSION: Endotracheal tube terminates 4.5 cm above the carina. Multifocal pneumonia, right upper lobe predominant, mildly progressive. Possible small left pleural effusion. Electronically Signed   By: Julian Hy M.D.   On: 11/06/2018 03:56   Dg Chest Port 1 View  Result Date: 11/05/2018 CLINICAL DATA:  Acute respiratory failure EXAM: PORTABLE CHEST 1 VIEW COMPARISON:  Yesterday FINDINGS: Endotracheal tube tip at the clavicular heads. The orogastric tube tip reaches the stomach. Increased hazy opacification of the bilateral chest with pleural fluid seen at the bases. Normal heart size. No pneumothorax. IMPRESSION: 1. Stable hardware positioning. 2. History of large volume aspiration with increased chest opacification including pleural fluid. Electronically Signed   By: Monte Fantasia M.D.   On: 11/05/2018 04:47   Dg Chest Port 1 View  Result Date: 11/04/2018 CLINICAL DATA:  Rule out covid.  Intubated. EXAM: PORTABLE CHEST 1 VIEW COMPARISON:  11/04/2018 FINDINGS: Endotracheal tube and NG tube are unchanged. Heart is borderline in size. Mild vascular congestion. Mild interstitial prominence within the lungs. No visible effusions or acute bony abnormality. IMPRESSION: Peribronchial thickening and interstitial prominence. This could reflect edema or atypical/viral infection. Electronically Signed   By: Rolm Baptise M.D.   On: 11/04/2018 10:32   Dg Abd Portable 1v  Result Date: 11/04/2018 CLINICAL DATA:  63 year old female currently intubated with abdominal distension EXAM: PORTABLE ABDOMEN - 1 VIEW COMPARISON:  Abdominal radiograph 11/01/2018 FINDINGS: A nasogastric tube is present. The tip of the tube overlies the gastric body. The visualized lung bases are clear. No evidence of large free air on this supine series. The colon is diffusely distended with gas but not dilated. No  significant gaseous distension of small bowel. A right femoral central venous catheter is present. The tip overlies the right common iliac vein. A second catheter is present more laterally, likely representing a arterial catheter within the superficial femoral artery. Lower lumbar degenerative disc disease. No acute osseous abnormality. IMPRESSION: 1. Marked gaseous distension of the colon without dilation. Findings are most suggestive of colonic ileus. 2. No evidence of small bowel obstruction. 3. The tip of the gastric tube overlies the gastric body. 4. Right femoral arterial and venous lines. Electronically Signed   By: Jacqulynn Cadet M.D.   On: 11/04/2018 10:32     Assessment/Plan: 63 yr female presenting with nausea vomiting low back pain, some disorientation and found to be hypotensive, , hypoxic later and intubated, also has high procal , fever   Sepsis like picture- unclear etiology. Blood culture negative  CTA on admission- no pneumonia MRI no discitis Now has aspirated - BAL done today Discussed with Dr.Aleskerov- she was not covered for atypical pneumonia especially legionella - with her presentation of nausea, vomiting, confusion, hyponatremia will have to be ruled out. Will change cefepime to levaquin, continue flagyl and  vanco until BAL cultures are available.   Smoker, had hypoxia/hypercarbia and was intubated COVID-19 negative  ?Has malignant melanoma of the left eye - being observed H/o sciatica and trochanteric bursitis  Discussed with Dr.Aleskerov On call ID available on phone this weekend

## 2018-11-06 NOTE — Progress Notes (Signed)
Pharmacy Antibiotic Note  Cindy Morrison is a 63 y.o. female admitted on 11/01/2018 with intractable nausea and vomiting and leucocytosis. Patient initially intubated on 3/29; self extubated on 4/1 and reintubated. BAL performed 4/3. Pharmacy has been consulted for Vancomycin dosing.   Plan: Continue vancomycin 1027m IV Q12hr. Serum creatinine remains stable. Will continue to monitor and assess for peak trough monitoring.   Levofloxacin 7552mIV Q24hr.   Metronidazole 50057mV Q8hr.   Height: _0  (167.6 cm) Weight: 198 lb 6.6 oz (90 kg) IBW/kg (Calculated) : 59.3  Temp (24hrs), Avg:99.7 F (37.6 C), Min:98.6 F (37 C), Max:100.9 F (38.3 C)  Recent Labs  Lab 11/01/18 1024 11/01/18 1757  11/02/18 0226 11/03/18 0449 11/03/18 1713 11/04/18 0440 11/05/18 0354 11/06/18 0357  WBC  --  9.1  --  16.8* 4.6  --  4.7 6.8 7.7  CREATININE  --  0.89   < > 1.29* 0.74 0.68 0.69 0.45 0.45  LATICACIDVEN 1.9 1.4  --   --   --   --   --   --   --    < > = values in this interval not displayed.    Estimated Creatinine Clearance: 80.3 mL/min (by C-G formula based on SCr of 0.45 mg/dL).    Allergies  Allergen Reactions  . Erythromycin Base Swelling    Erythromycin ophthalmic ointment    Antimicrobials this admission: Ceftriaxone 3/29 >> 3/30 Doxycycline 3/30 >> 3/31 Meropenem 3/31  Metronidazole 3/31 >> Cefepime 3/31 >> 4/3  Vancomycin 3/31 >> Levofloxacin 4/3 >>  Dose adjustments this admission: 4/1 Vancomycin adjusted   Microbiology results: 3/30 BCx: no growth x 4 days  4/03 BAL: sent  3/29 MRSA PCR: negative  3/29 Coronavirus: not detected   Thank you for allowing pharmacy to be a part of this patient's care.  Latash Nouri L 11/06/2018 8:32 PM

## 2018-11-06 NOTE — Progress Notes (Signed)
[place] [facility] [place] NAME: Malayjah Gatchell    MR#:  [id number]  DATE OF BIRTH:  February 17, 1956  SUBJECTIVE:   Remains intubated and sedated.   REVIEW OF SYSTEMS:  ROS- intubated and sedated  DRUG ALLERGIES:   Allergies  Allergen Reactions   Erythromycin Base Swelling    Erythromycin ophthalmic ointment   VITALS:  Blood pressure (!) 106/58, pulse 70, temperature 100.2 F (37.9 C), resp. rate (!) 26, height 5\' 6"  (1.676 m), weight 90 kg, SpO2 97 %. PHYSICAL EXAMINATION:  Physical Exam  GENERAL:  63 y.o.-year-old patient lying in the bed with no acute distress.  EYES: Pupils equal, round, reactive to light and accommodation. No scleral icterus. Extraocular muscles intact.  HEENT: Head atraumatic, normocephalic. Oropharynx and nasopharynx clear. ETT in place. NECK:  Supple, no jugular venous distention. No thyroid enlargement, no tenderness.  LUNGS: +diffuse rhonchi, no wheezing, rales,or crepitation. No use of accessory muscles of respiration.  CARDIOVASCULAR: RRR, S1, S2 normal. No murmurs, rubs, or gallops.  ABDOMEN: Soft, nontender, nondistended. Bowel sounds present. No organomegaly or mass.  EXTREMITIES: No pedal edema, cyanosis, or clubbing.  NEUROLOGIC: unable to assess- intubated and sedated PSYCHIATRIC: unable to assess SKIN: No obvious rash, lesion, or ulcer.  LABORATORY PANEL:  Female CBC Recent Labs  Lab 11/06/18 0357  WBC 7.7  HGB 10.1*  HCT 30.9*  PLT 183   ------------------------------------------------------------------------------------------------------------------ Chemistries  Recent Labs  Lab 11/06/18 0357  NA 140  K 3.4*  CL 102  CO2 31  GLUCOSE 117*  BUN 12  CREATININE 0.45  CALCIUM 7.7*  MG 2.0  AST 31  ALT 42  ALKPHOS 41  BILITOT 0.7   RADIOLOGY:  Dg Chest Port 1 View  Result Date: 11/06/2018 CLINICAL DATA:  Acute respiratory failure EXAM: PORTABLE CHEST 1 VIEW COMPARISON:  11/05/2018  FINDINGS: Endotracheal tube terminates 4.5 cm above the carina. Enteric tube courses into the stomach. Multifocal patchy opacities, right upper lobe predominant, compatible with multifocal pneumonia (reportedly on the basis of aspiration, mildly progressive. Possible small left pleural effusion. No pneumothorax. The heart is normal in size. IMPRESSION: Endotracheal tube terminates 4.5 cm above the carina. Multifocal pneumonia, right upper lobe predominant, mildly progressive. Possible small left pleural effusion. Electronically Signed   By: Julian Hy M.D.   On: 11/06/2018 03:56   ASSESSMENT AND PLAN:   Acute hypoxic respiratory failure and moderate ARDS -vent management per CCM -COVID testing is negative  Sepsis- unclear etiology. No signs of UTI or pneumonia. -ID consulted- recommending MRI spine when extubated -continue broad spectrum antibiotics  Acute gastroenteritis- resolved. No additional episodes of vomiting.  Elevated troponin- likely demand ischemia. Monitor.  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 Management plans discussed with the patient, family and they are in agreement.  CODE STATUS: Full Code  TOTAL TIME TAKING CARE OF THIS PATIENT: 35 minutes.   More than 50% of the time was spent in counseling/coordination of care: YES  DISCHARGE UNKNOWN, DEPENDING ON CLINICAL CONDITION.   Berna Spare Elbridge Magowan M.D on 11/06/2018 at 12:20 PM  Between 7am to 6pm - Pager - [phone]  After 6pm go to [email or web address] - Proofreader  [facility]  Office  [phone]  CC: Primary care physician; Katheren Shams  Note: This dictation was prepared with Dragon dictation along with smaller phrase technology. Any transcriptional errors that result from this process  are unintentional.

## 2018-11-06 NOTE — Procedures (Signed)
FLEXIBLE BRONCHOSCOPY PROCEDURE NOTE    Flexible bronchoscopy was performed on 11/06/18 by : Lanney Gins MD  assistance by : France Ravens RN  and 2)Brantley RT   Indication for the procedure was : Septic shock with prolonged acute hypoxic respiratory failure   Medication list reviewed: y  The patient's interval history was taken and revealed: no new complaints The pre- procedure physical examination revealed: No new findings Refer to prior clinic note for details.  Informed Consent: emergent due to acute hypoxemia on MV, procedure discussed with husband by me.     Procedural Preparation: Time out was performed and patient was identified by name and birthdate and procedure to be performed and side for sampling, if any, was specified. Pt was intubated by anesthesia.   Procedure Findings:   Bronchoscope was inserted via ETT  without difficulty.  Posterior oropharynx, epiglottis, arytenoids, false cords and vocal cords were not visualized as these were bypassed by endotracheal tube. The distal trachea was normal in circumference and appearance without mucosal, cartilaginous or branching abnormalities.  The main carina was sharp nonsplayed . All right and left lobar airways were visualized to the Subsegmental level.  Sub- sub segmental carinae were identified in all the distal airways.  Secretions were visible in the following airways and appeared to be darkened phlegm. The mucosa was : non-edematous and nonfriable  Airways were notable for:        exophytic lesions :none       extrinsic compression in the following distributions: RML.       Friable mucosa: none       Anthrocotic material /pigmentation: none   Pictorial documentation attached: n      Specimens obtained included:   Broncho-alveolar lavage site:RML  sent for microbiology                             63m volume infused 43mvolume returned with cloudy with darkened debris in  appearance   The bronchoscopy was terminated due to completion of the planned procedure and the bronchoscope was removed.    Moderate sedation used:   Versed 2 mg  Estimated Blood loss: 0cc.  Complications included:  none   Preliminary CXR findings :  n/a  Disposition: pt remains critically ill in MICU     FrClaudette StaplerD  [facility]

## 2018-11-06 NOTE — Progress Notes (Signed)
 CRITICAL CARE NOTE       SUBJECTIVE FINDINGS & SIGNIFICANT EVENTS   Patient remains critically ill Prognosis is guarded COVID negative Plan for bronch with BAL to lavage/evacuation of gastric aspiration as well as invasive pulmonary specimen for microbiology. Plan for MRI spine today    1245pm- Family phone conference - Randy Nardozzi - husband - discussed care plan including bronchoscopy and MRI today. All questions answered, husband appreciative of care.   634pm - reviewed MRI thoracic/lumbar - bilateral pulmonary inflitrates and pleural effusions - will diurese gently overnight.        No acute spinal disease   PAST MEDICAL HISTORY   Past Medical History:  Diagnosis Date  . Anxiety   . Cancer (HCC)    choroid melanoma left eye  . GERD (gastroesophageal reflux disease)   . Psoriasis      SURGICAL HISTORY   Past Surgical History:  Procedure Laterality Date  . ABDOMINAL HYSTERECTOMY    . BICEPT TENODESIS Right 12/18/2017   Procedure: BICEPS TENODESIS;  Surgeon: Patel, Sunny, MD;  Location: [facility] ORS;  Service: Orthopedics;  Laterality: Right;  . BRAIN TUMOR EXCISION  2007  . COLONOSCOPY    . ESOPHAGOGASTRODUODENOSCOPY    . EYE SURGERY  2012  . MOHS SURGERY  2018  . SHOULDER ARTHROSCOPY WITH OPEN ROTATOR CUFF REPAIR Right 12/18/2017   Procedure: SHOULDER ARTHROSCOPY WITH OPEN ROTATOR CUFF REPAIR,DISTAL CLAVICLE EXCISION,SUBACROMIAL DECOMPRESSION;  Surgeon: Patel, Sunny, MD;  Location: [facility] ORS;  Service: Orthopedics;  Laterality: Right;     FAMILY HISTORY   Family History  Problem Relation Age of Onset  . Heart attack Father   . Lung cancer Father      SOCIAL HISTORY   Social History   Tobacco Use  . Smoking status: Current Every Day Smoker    Packs/day: 1.00  . Smokeless tobacco: Never  Used  Substance Use Topics  . Alcohol use: Yes    Frequency: Never    Comment: occasionally  . Drug use: Never     MEDICATIONS   Current Medication:  Current Facility-Administered Medications:  .  acetaminophen (TYLENOL) tablet 650 mg, 650 mg, Per Tube, Q6H PRN, 650 mg at 11/05/18 0852 **OR** acetaminophen (TYLENOL) suppository 650 mg, 650 mg, Rectal, Q6H PRN, , , MD .  bisacodyl (DULCOLAX) suppository 10 mg, 10 mg, Rectal, Daily PRN, Tukov-Yual, Magdalene S, NP .  budesonide (PULMICORT) nebulizer solution 0.5 mg, 0.5 mg, Nebulization, BID, Tukov-Yual, Magdalene S, NP, 0.5 mg at 11/06/18 0728 .  ceFEPIme (MAXIPIME) 2 g in sodium chloride 0.9 % 100 mL IVPB, 2 g, Intravenous, Q8H, Zeigler, Dustin G, RPH, Last Rate: 200 mL/hr at 11/06/18 0427, 2 g at 11/06/18 0427 .  chlorhexidine gluconate (MEDLINE KIT) (PERIDEX) 0.12 % solution 15 mL, 15 mL, Mouth Rinse, BID, Kasa, Kurian, MD, 15 mL at 11/06/18 0809 .  Chlorhexidine Gluconate Cloth 2 % PADS 6 each, 6 each, Topical, Daily, Kasa, Kurian, MD, 6 each at 11/05/18 0906 .  dexmedetomidine (PRECEDEX) 400 MCG/100ML (4 mcg/mL) infusion, 0.4-1.2 mcg/kg/hr, Intravenous, Titrated, , , MD, Last Rate: 23.7 mL/hr at 11/06/18 0818, 1.2 mcg/kg/hr at 11/06/18 0818 .  enoxaparin (LOVENOX) injection 40 mg, 40 mg, Subcutaneous, Q24H, , , MD, 40 mg at 11/05/18 1940 .  famotidine (PEPCID) IVPB 20 mg premix, 20 mg, Intravenous, Q12H, , , MD, Last Rate: 100 mL/hr at 11/05/18 2300 .  fentaNYL (SUBLIMAZE) injection 100 mcg, 100 mcg, Intravenous, Once, Kasa, Kurian, MD .  fentaNYL 2500mcg in NS   250mL (10mcg/ml) infusion-PREMIX, 0-400 mcg/hr, Intravenous, Continuous, Kasa, Kurian, MD, Last Rate: 37.5 mL/hr at 11/06/18 0818, 375 mcg/hr at 11/06/18 0818 .  ipratropium-albuterol (DUONEB) 0.5-2.5 (3) MG/3ML nebulizer solution 3 mL, 3 mL, Nebulization, Q6H, Tukov-Yual, Magdalene S, NP, 3 mL at 11/06/18 0728 .  MEDLINE mouth  rinse, 15 mL, Mouth Rinse, 10 times per day, Kasa, Kurian, MD, 15 mL at 11/06/18 0530 .  metroNIDAZOLE (FLAGYL) IVPB 500 mg, 500 mg, Intravenous, Q8H, Ravishankar, Jayashree, MD, Last Rate: 100 mL/hr at 11/06/18 0320, 500 mg at 11/06/18 0320 .  midazolam (VERSED) 50 mg in sodium chloride 0.9 % 50 mL (1 mg/mL) infusion, 0.5-4 mg/hr, Intravenous, Continuous, Keene, Jeremiah D, NP, Stopped at 11/05/18 1126 .  midazolam (VERSED) injection 2 mg, 2 mg, Intravenous, Q4H PRN, Blakeney, Dana G, NP, 2 mg at 11/06/18 0804 .  nicotine (NICODERM CQ - dosed in mg/24 hours) patch 21 mg, 21 mg, Transdermal, Daily, , , MD, 21 mg at 11/05/18 1222 .  norepinephrine (LEVOPHED) 16 mg in 250mL premix infusion, 0-40 mcg/min, Intravenous, Titrated, Tukov-Yual, Magdalene S, NP, Stopped at 11/06/18 0749 .  ondansetron (ZOFRAN) tablet 4 mg, 4 mg, Per NG tube, Q6H PRN **OR** ondansetron (ZOFRAN) injection 4 mg, 4 mg, Intravenous, Q6H PRN, Tukov-Yual, Magdalene S, NP .  polyethylene glycol (MIRALAX / GLYCOLAX) packet 17 g, 17 g, Oral, Daily PRN, Mayo, Katy Dodd, MD .  polyethylene glycol (MIRALAX / GLYCOLAX) packet 17 g, 17 g, Per Tube, Q24H, , , MD, 17 g at 11/05/18 0852 .  potassium chloride 10 mEq in 100 mL IVPB, 10 mEq, Intravenous, Q1 Hr x 4, Simpson, Michael L, RPH, Last Rate: 100 mL/hr at 11/06/18 0818 .  promethazine (PHENERGAN) injection 12.5 mg, 12.5 mg, Intravenous, Q6H PRN, Mayo, Katy Dodd, MD, 12.5 mg at 11/01/18 1247 .  propofol (DIPRIVAN) 1000 MG/100ML infusion, 0-50 mcg/kg/min, Intravenous, Titrated, , , MD, Last Rate: 26.8 mL/hr at 11/06/18 0818, 50 mcg/kg/min at 11/06/18 0818 .  senna-docusate (Senokot-S) tablet 2 tablet, 2 tablet, Per Tube, Q12H, , , MD, 2 tablet at 11/05/18 1942 .  sennosides (SENOKOT) 8.8 MG/5ML syrup 5 mL, 5 mL, Per Tube, BID PRN, Tukov-Yual, Magdalene S, NP, 5 mL at 11/02/18 1536 .  vancomycin (VANCOCIN) IVPB 1000 mg/200 mL premix, 1,000  mg, Intravenous, Q12H, , , MD, Last Rate: 200 mL/hr at 11/06/18 0818 .  vecuronium (NORCURON) injection 10 mg, 10 mg, Intravenous, Q1H PRN, Tukov-Yual, Magdalene S, NP, 10 mg at 11/05/18 0645 .  venlafaxine (EFFEXOR) tablet 75 mg, 75 mg, Per NG tube, BID WC, Tukov-Yual, Magdalene S, NP, 75 mg at 11/05/18 1941    ALLERGIES   Erythromycin base    REVIEW OF SYSTEMS    10 system ROS unable to obtain due to critical illness.   PHYSICAL EXAMINATION   Vitals:   11/06/18 0600 11/06/18 0800  BP: 133/71 (!) 106/58  Pulse: 70 85  Resp: (!) 23 18  Temp: 100.2 F (37.9 C) (!) 100.9 F (38.3 C)  SpO2: 92% 96%    GENERAL:RASS-1 HEAD: Normocephalic, atraumatic.  EYES: Pupils equal, round, reactive to light.  No scleral icterus.  MOUTH: Moist mucosal membrane. NECK: Supple. No thyromegaly. No nodules. No JVD.  PULMONARY: bilateral rhonchi CARDIOVASCULAR: S1 and S2. Regular rate and rhythm. No murmurs, rubs, or gallops.  GASTROINTESTINAL: Soft, nontender, non-distended. No masses. Positive bowel sounds. No hepatosplenomegaly.  MUSCULOSKELETAL: No swelling, clubbing, or edema.  NEUROLOGIC: Mild distress due to acute illness SKIN:intact,warm,dry   LABS AND IMAGING     -  I personally reviewed most recent blood work, imaging and microbiology - significant findings today are hypokalemia, anemia.  LAB RESULTS: Recent Labs  Lab 11/04/18 0440 11/05/18 0354 11/06/18 0357  NA 138 140 140  K 3.5 3.1* 3.4*  CL 102 103 102  CO2 _0 BUN _1 CREATININE 0.69 0.45 0.45  GLUCOSE 167* 133* 117*   Recent Labs  Lab 11/04/18 0440 11/05/18 0354 11/06/18 0357  HGB 10.1* 10.3* 10.1*  HCT 30.8* 30.9* 30.9*  WBC 4.7 6.8 7.7  PLT 140* 149* 183     IMAGING RESULTS: Dg Chest Port 1 View  Result Date: 11/06/2018 CLINICAL DATA:  Acute respiratory failure EXAM: PORTABLE CHEST 1 VIEW COMPARISON:  11/05/2018 FINDINGS: Endotracheal tube terminates 4.5 cm above the carina.  Enteric tube courses into the stomach. Multifocal patchy opacities, right upper lobe predominant, compatible with multifocal pneumonia (reportedly on the basis of aspiration, mildly progressive. Possible small left pleural effusion. No pneumothorax. The heart is normal in size. IMPRESSION: Endotracheal tube terminates 4.5 cm above the carina. Multifocal pneumonia, right upper lobe predominant, mildly progressive. Possible small left pleural effusion. Electronically Signed   By: Julian Hy M.D.   On: 11/06/2018 03:56        ASSESSMENT AND PLAN    -Multidisciplinary rounds held today   Acute Hypoxic Respiratory Failure- MODERATE ARDS PaO2/FiO2 <200 -poss COVID r/o -decreased SpO2 to <60%        Plan for bronchoscopy with BAL today -discussed with husband.  -continue Full MV support -continue Bronchodilator Therapy -Wean Fio2 and PEEP as tolerated -will perform SAT/SBT when respiratory parameters are met    Acute gastroenteritis-RESOLVED -Possible COVID related - results pending   CARDIAC FAILURE-resolved  Demand ischemia with mild troponin elevation -Lasix as tolerated -follow up cardiac enzymes as indicated ICU monitoring   Renal Failure-resolved now -continue Foley Catheter-assess need daily   NEUROLOGY - intubated and sedated - minimal sedation to achieve a RASS goal: -1 Wake up assessment pending   ID- consult ordered- Appreciate input - poss GAS vs lumbar spine related infectious culprit -continue IV abx as prescibed -follow up cultures -MRI today - lumbar and thoracic w/wo   GI/Nutrition GI PROPHYLAXIS as indicated DIET-->TF's as tolerated Constipation protocol as indicated  ENDO - ICU hypoglycemic\Hyperglycemia protocol -check FSBS per protocol   ELECTROLYTES -follow labs as needed -replace as needed -pharmacy consultation   DVT/GI PRX ordered -SCDs  TRANSFUSIONS AS NEEDED MONITOR FSBS ASSESS the need for LABS  as needed   Critical care provider statement:  Critical care time (minutes):125 Critical care time was exclusive of: Separately billable procedures and treating other patients Critical care was necessary to treat or prevent imminent or life-threatening deterioration of the following conditions:acute hypoxemic respiratory failure, COVID19 rule out, COPD, AKI, demand ischemia , acute gastroenteritis Critical care was time spent personally by me on the following activities: Development of treatment plan with patient or surrogate, discussions with consultants, evaluation of patient's response to treatment, examination of patient, obtaining history from patient or surrogate, ordering and performing treatments and interventions, ordering and review of laboratory studies and re-evaluation of patient's condition. I assumed direction of critical care for this patient from another provider in my specialty: no    Ottie Glazier, M.D.  Division of [place]

## 2018-11-07 LAB — BLOOD GAS, ARTERIAL
Acid-Base Excess: 5.5 mmol/L — ABNORMAL HIGH (ref 0.0–2.0)
Bicarbonate: 31.1 mmol/L — ABNORMAL HIGH (ref 20.0–28.0)
FIO2: 60
MECHVT: 450 mL
Mechanical Rate: 24
O2 Saturation: 94.6 %
PEEP: 5 cmH2O
Patient temperature: 37
RATE: 24 resp/min
pCO2 arterial: 48 mmHg (ref 32.0–48.0)
pH, Arterial: 7.42 (ref 7.350–7.450)
pO2, Arterial: 72 mmHg — ABNORMAL LOW (ref 83.0–108.0)

## 2018-11-07 LAB — CULTURE, BLOOD (ROUTINE X 2)
Culture: NO GROWTH
Culture: NO GROWTH
SPECIAL REQUESTS: ADEQUATE
Special Requests: ADEQUATE

## 2018-11-07 LAB — CBC
HCT: 29.8 % — ABNORMAL LOW (ref 36.0–46.0)
Hemoglobin: 9.7 g/dL — ABNORMAL LOW (ref 12.0–15.0)
MCH: 31.2 pg (ref 26.0–34.0)
MCHC: 32.6 g/dL (ref 30.0–36.0)
MCV: 95.8 fL (ref 80.0–100.0)
Platelets: 210 10*3/uL (ref 150–400)
RBC: 3.11 MIL/uL — ABNORMAL LOW (ref 3.87–5.11)
RDW: 13.5 % (ref 11.5–15.5)
WBC: 10.3 10*3/uL (ref 4.0–10.5)
nRBC: 0 % (ref 0.0–0.2)

## 2018-11-07 LAB — BASIC METABOLIC PANEL
Anion gap: 8 (ref 5–15)
BUN: 11 mg/dL (ref 8–23)
CO2: 30 mmol/L (ref 22–32)
Calcium: 8.1 mg/dL — ABNORMAL LOW (ref 8.9–10.3)
Chloride: 101 mmol/L (ref 98–111)
Creatinine, Ser: 0.57 mg/dL (ref 0.44–1.00)
GFR calc Af Amer: >60 mL/min (ref >60)
GFR calc non Af Amer: >60 mL/min (ref >60)
Glucose, Bld: 97 mg/dL (ref 70–99)
Potassium: 3.3 mmol/L — ABNORMAL LOW (ref 3.5–5.1)
Sodium: 139 mmol/L (ref 135–145)

## 2018-11-07 LAB — GLUCOSE, CAPILLARY
Glucose-Capillary: 107 mg/dL — ABNORMAL HIGH (ref 70–99)
Glucose-Capillary: 108 mg/dL — ABNORMAL HIGH (ref 70–99)
Glucose-Capillary: 110 mg/dL — ABNORMAL HIGH (ref 70–99)
Glucose-Capillary: 113 mg/dL — ABNORMAL HIGH (ref 70–99)
Glucose-Capillary: 113 mg/dL — ABNORMAL HIGH (ref 70–99)
Glucose-Capillary: 115 mg/dL — ABNORMAL HIGH (ref 70–99)
Glucose-Capillary: 92 mg/dL (ref 70–99)

## 2018-11-07 LAB — PHOSPHORUS: Phosphorus: 2.8 mg/dL (ref 2.5–4.6)

## 2018-11-07 LAB — MAGNESIUM: Magnesium: 1.8 mg/dL (ref 1.7–2.4)

## 2018-11-07 LAB — PROCALCITONIN: Procalcitonin: 1.31 ng/mL

## 2018-11-07 MED ORDER — POTASSIUM CHLORIDE 20 MEQ PO PACK
40.0000 meq | PACK | Freq: Once | ORAL | Status: AC
  Administered 2018-11-07: 40 meq
  Filled 2018-11-07: qty 2

## 2018-11-07 MED ORDER — MAGNESIUM SULFATE 2 GM/50ML IV SOLN
2.0000 g | Freq: Once | INTRAVENOUS | Status: AC
  Administered 2018-11-07: 2 g via INTRAVENOUS
  Filled 2018-11-07: qty 50

## 2018-11-07 MED ORDER — FUROSEMIDE 10 MG/ML IJ SOLN
40.0000 mg | Freq: Once | INTRAMUSCULAR | Status: AC
  Administered 2018-11-08: 40 mg via INTRAVENOUS
  Filled 2018-11-07: qty 4

## 2018-11-07 MED ORDER — MIDAZOLAM 50MG/50ML (1MG/ML) PREMIX INFUSION
3.0000 mg/h | INTRAVENOUS | Status: DC
  Administered 2018-11-07: 0.5 mg/h via INTRAVENOUS
  Administered 2018-11-07: 2 mg/h via INTRAVENOUS
  Administered 2018-11-08 – 2018-11-10 (×4): 3 mg/h via INTRAVENOUS
  Filled 2018-11-07 (×9): qty 50

## 2018-11-07 MED ORDER — FUROSEMIDE 10 MG/ML IJ SOLN: 40.0000 mg | Freq: Once | INTRAMUSCULAR | Status: DC

## 2018-11-07 MED ORDER — POTASSIUM CHLORIDE 10 MEQ/50ML IV SOLN
10.0000 meq | INTRAVENOUS | Status: AC
  Administered 2018-11-07 (×2): 10 meq via INTRAVENOUS
  Filled 2018-11-07 (×2): qty 50

## 2018-11-07 MED ORDER — FENTANYL BOLUS VIA INFUSION
50.0000 ug | INTRAVENOUS | Status: DC | PRN
  Administered 2018-11-07: 50 ug via INTRAVENOUS
  Administered 2018-11-07 (×4): 100 ug via INTRAVENOUS
  Administered 2018-11-07: 50 ug via INTRAVENOUS
  Administered 2018-11-08 – 2018-11-13 (×13): 100 ug via INTRAVENOUS
  Administered 2018-11-13: 50 ug via INTRAVENOUS
  Administered 2018-11-13: 100 ug via INTRAVENOUS
  Filled 2018-11-07: qty 100

## 2018-11-07 NOTE — Progress Notes (Signed)
Pharmacy Electrolyte Monitoring Consult:  Pharmacy consulted to assist in monitoring and replacing electrolytes in this 63 y.o. female admitted on 11/01/2018 with Emesis   Labs:  Sodium (mmol/L)  Date Value  11/07/2018 139  03/16/2014 136   Potassium (mmol/L)  Date Value  11/07/2018 3.3 (L)  03/16/2014 3.8   Magnesium (mg/dL)  Date Value  11/07/2018 1.8   Phosphorus (mg/dL)  Date Value  11/07/2018 2.8   Calcium (mg/dL)  Date Value  11/07/2018 8.1 (L)   Calcium, Total (mg/dL)  Date Value  03/16/2014 9.0   Albumin (g/dL)  Date Value  11/06/2018 1.9 (L)  03/16/2014 3.8    Assessment/Plan: OG tube placed to suction on 4/2.   4/4  0534 K: 3.3, Mg: 1.8, Phos 2.8. Provider has already ordered KCL 75meq IV x 2 and Magnesium 2g IV x 1. No additional replacement needed at this time.   Will replace for goal potassium ~ 4, goal magnesium ~ 2, and phosphate > 2.5.   Will obtain electrolytes with am labs.   Pharmacy will continue to monitor and adjust per consult.    Pernell Dupre, PharmD, BCPS Clinical Pharmacist 11/07/2018 6:03 AM

## 2018-11-07 NOTE — Progress Notes (Signed)
[facility] NAME: Cindy Morrison    MR#:  [id number]  DATE OF BIRTH:  12-24-1955  SUBJECTIVE:  CHIEF COMPLAINT:   Chief Complaint  Patient presents with  . Emesis   No new complaint reported by nursing staff.  Patient remains sedated on the vent.  Requiring four-point restraints for patient safety.  On Precedex and as needed Versed.  REVIEW OF SYSTEMS:  ROS Unobtainable due to patient being on the vent.  DRUG ALLERGIES:   Allergies  Allergen Reactions  . Erythromycin Base Swelling    Erythromycin ophthalmic ointment   VITALS:  Blood pressure (!) 106/57, pulse 69, temperature 98.2 F (36.8 C), temperature source Axillary, resp. rate 16, height _0  (1.676 m), weight 90.3 kg, SpO2 96 %. PHYSICAL EXAMINATION:  Physical Exam  GENERAL:Patient sedated on the vent.  Requiring four-point restraints for patient safety  EYES: Pupils equal, round, reactive to light and accommodation. No scleral icterus. Extraocular muscles intact.  HEENT: Head atraumatic, normocephalic. Oropharynx and nasopharynx clear.ETT in place. NECK: Supple, no jugular venous distention. No thyroid enlargement, no tenderness.  LUNGS: +diffuse rhonchi, no wheezing, rales,or crepitation. No use of accessory muscles of respiration.  CARDIOVASCULAR: RRR,S1, S2 normal. No murmurs, rubs, or gallops.  ABDOMEN: Soft, nontender, nondistended. Bowel sounds present. No organomegaly or mass.  EXTREMITIES: No pedal edema, cyanosis, or clubbing.  NEUROLOGIC: unable to assess- intubated and sedated PSYCHIATRIC: unable to assess SKIN: No obvious rash, lesion, or ulcer.  LABORATORY PANEL:  Female CBC Recent Labs  Lab 11/07/18 0425  WBC 10.3  HGB 9.7*  HCT 29.8*  PLT 210   ------------------------------------------------------------------------------------------------------------------ Chemistries  Recent Labs  Lab 11/06/18 0357 11/07/18 0425  NA 140 139  K  3.4* 3.3*  CL 102 101  CO2 31 30  GLUCOSE 117* 97  BUN 12 11  CREATININE 0.45 0.57  CALCIUM 7.7* 8.1*  MG 2.0 1.8  AST 31  --   ALT 42  --   ALKPHOS 41  --   BILITOT 0.7  --    RADIOLOGY:  Mr Thoracic Spine W Wo Contrast  Result Date: 11/06/2018 CLINICAL DATA:  Patient with history of multiple myeloma. Acute presentation with back pain. Persistent fevers. EXAM: MRI THORACIC WITHOUT AND WITH CONTRAST TECHNIQUE: Multiplanar and multiecho pulse sequences of the thoracic spine were obtained without and with intravenous contrast. CONTRAST:  9 cc Gadavist COMPARISON:  CT chest done 5 days ago. FINDINGS: MRI THORACIC SPINE FINDINGS Alignment:  Normal Vertebrae: No fracture or focal bone lesion. No evidence of bone or disc space infection. Cord:  Normal Paraspinal and other soft tissues: Widespread bilateral pulmonary infiltrates have developed since the previous chest CT consistent with pneumonia. There are layering pleural effusions right more than left. Disc levels: Ordinary mild bulging of T7-8 and T3-4 without evidence of compressive stenosis. No facet arthropathy. IMPRESSION: No likely significant spinal finding. Mild non-compressive disc bulges at T3-4 and T7-8. No evidence of spinal infection. Markedly worsened pulmonary infiltrates and bilateral pleural effusions when compared to the previous chest CT. Electronically Signed   By: Nelson Chimes M.D.   On: 11/06/2018 17:06   Mr Lumbar Spine W Wo Contrast  Result Date: 11/06/2018 CLINICAL DATA:  History of malignant melanoma. Acute presentation with back pain. Persistent fever. EXAM: MRI LUMBAR SPINE WITHOUT AND WITH CONTRAST TECHNIQUE: Multiplanar and multiecho pulse sequences of the lumbar spine were obtained without and with intravenous contrast. CONTRAST:  9 cc Gadavist COMPARISON:  CT abdomen 09/01/2017. FINDINGS: Segmentation:  5 lumbar type vertebral bodies. Alignment: Mild curvature convex to the left with the apex at L4. 3 mm degenerative  anterolisthesis L5-S1. Vertebrae: Edema within the L3, L4 and L5 vertebral bodies most consistent with degenerative discogenic edema. Conus medullaris and cauda equina: Conus extends to the L1 level. Conus and cauda equina appear normal. Paraspinal and other soft tissues: Negative Disc levels: T12-L1: Shallow protrusion of the disc in the midline. This indents the thecal sac but does not appear to cause neural compression. L1-2 and L2-3: Normal. L3-4: Chronic disc degeneration with endplate osteophytes and shallow protrusion of the disc. Narrowing of the disc height. Facet and ligamentous hypertrophy. Moderate multifactorial stenosis that could be symptomatic. Findings appear similar to the CT scan of last year. L4-5: Chronic disc degeneration with endplate osteophytes and shallow protrusion of the disc. Narrowing of the disc space. Facet and ligamentous hypertrophy. Stenosis of the lateral recesses and foramina right more than left. Neural compression could occur at this level. Similar appearance to the study of the previous CT. L5-S1: Advanced bilateral facet arthropathy with 3 mm of anterolisthesis. Mild bulging of the disc. Stenosis of the subarticular lateral recesses that could possibly cause neural compression. Similar appearance to the previous CT. There is no finding to strongly suggest spinal infection. The L3-4, L4-5 and L5-S1 findings are quite likely chronic and degenerative, particularly when viewed in combination with the CT study of January 2019. 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 Spinal stenosis that could cause neural compression. Discogenic vertebral body edema that could be associated with back pain. See above discussion. L5-S1 facet arthropathy with degenerative anterolisthesis of 3 mm. Stenosis of the subarticular lateral recesses. Findings at this level could also be symptomatic. Electronically Signed   By: Nelson Chimes M.D.   On: 11/06/2018 17:03   ASSESSMENT  AND PLAN:   Acute hypoxic respiratory failure and moderate ARDS -vent management per CCM -COVID testing is negative -Patient status post flexible bronchoscopy done on 11/06/2018  Sepsis- unclear etiology. No signs of UTI or pneumonia. -ID consulted- recommended MRI spine which was done on 11/06/2018 with no discitis.  -continue broad spectrum antibiotics per infectious disease specialist.  Appreciate input.  Patient currently on levofloxacin, Flagyl and vancomycin  Acute gastroenteritis- resolved. No additional episodes of vomiting.  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 Management plans discussed with the patient, family and they are in agreement.  CODE STATUS: Full Code  TOTAL TIME TAKING CARE OF THIS PATIENT: 35 minutes.   More than 50% of the time was spent in counseling/coordination of care: YES  POSSIBLE D/C IN 3 DAYS, DEPENDING ON CLINICAL CONDITION.   Chele Cornell M.D on 11/07/2018 at 12:13 PM  Between 7am to 6pm - Pager - [phone]  After 6pm go to [email or web address] - Proofreader  [facility]  Office  [phone]  CC: Primary care physician; Katheren Shams  Note: This dictation was prepared with Dragon dictation along with smaller phrase technology. Any transcriptional errors that result from this process are unintentional.

## 2018-11-07 NOTE — Progress Notes (Signed)
CRITICAL CARE NOTE        SUBJECTIVE FINDINGS & SIGNIFICANT EVENTS   Patient remains critically ill Patient has been diuresed today over 1 L.  Her FiO2 on ventilator has decreased to 50 from 60.  She has been afebrile without leukocytosis on serology, currently comfortable at RASS of -2  -Updated husband Terry-including care plan, pending microbiology from BAL, MRI results, ventilator settings and diuresis.  He is thankful for care  PAST MEDICAL HISTORY   Past Medical History:  Diagnosis Date  . Anxiety   . Cancer ([place])    choroid melanoma left eye  . GERD (gastroesophageal reflux disease)   . Psoriasis      SURGICAL HISTORY   Past Surgical History:  Procedure Laterality Date  . ABDOMINAL HYSTERECTOMY    . BICEPT TENODESIS Right 12/18/2017   Procedure: BICEPS TENODESIS;  Surgeon: Leim Fabry, MD;  Location: [facility] ORS;  Service: Orthopedics;  Laterality: Right;  . BRAIN TUMOR EXCISION  2007  . COLONOSCOPY    . ESOPHAGOGASTRODUODENOSCOPY    . EYE SURGERY  2012  . MOHS SURGERY  2018  . SHOULDER ARTHROSCOPY WITH OPEN ROTATOR CUFF REPAIR Right 12/18/2017   Procedure: SHOULDER ARTHROSCOPY WITH OPEN ROTATOR CUFF REPAIR,DISTAL CLAVICLE EXCISION,SUBACROMIAL DECOMPRESSION;  Surgeon: Leim Fabry, MD;  Location: [facility] ORS;  Service: Orthopedics;  Laterality: Right;     FAMILY HISTORY   Family History  Problem Relation Age of Onset  . Heart attack Father   . Lung cancer Father      SOCIAL HISTORY   Social History   Tobacco Use  . Smoking status: Current Every Day Smoker    Packs/day: 1.00  . Smokeless tobacco: Never Used  Substance Use Topics  . Alcohol use: Yes    Frequency: Never    Comment: occasionally  . Drug use: Never     MEDICATIONS   Current Medication:  Current  Facility-Administered Medications:  .  acetaminophen (TYLENOL) tablet 650 mg, 650 mg, Per Tube, Q6H PRN, 650 mg at 11/05/18 0852 **OR** acetaminophen (TYLENOL) suppository 650 mg, 650 mg, Rectal, Q6H PRN, Ottie Glazier, MD .  bisacodyl (DULCOLAX) suppository 10 mg, 10 mg, Rectal, Daily PRN, Tukov-Yual, Magdalene S, NP .  budesonide (PULMICORT) nebulizer solution 0.5 mg, 0.5 mg, Nebulization, BID, Tukov-Yual, Magdalene S, NP, 0.5 mg at 11/07/18 0800 .  chlorhexidine gluconate (MEDLINE KIT) (PERIDEX) 0.12 % solution 15 mL, 15 mL, Mouth Rinse, BID, Kasa, Kurian, MD, 15 mL at 11/07/18 1304 .  Chlorhexidine Gluconate Cloth 2 % PADS 6 each, 6 each, Topical, Daily, Flora Lipps, MD, 6 each at 11/07/18 1330 .  dexmedetomidine (PRECEDEX) 400 MCG/100ML (4 mcg/mL) infusion, 0.4-1.2 mcg/kg/hr, Intravenous, Titrated, Blakeney, Dreama Saa, NP, Last Rate: 27 mL/hr at 11/07/18 1250, 1.2 mcg/kg/hr at 11/07/18 1250 .  enoxaparin (LOVENOX) injection 40 mg, 40 mg, Subcutaneous, Q24H, Lanney Gins, Carleena Mires, MD, 40 mg at 11/06/18 2206 .  famotidine (PEPCID) IVPB 20 mg premix, 20 mg, Intravenous, Q12H, Rilynne Lonsway, MD, Last Rate: 100 mL/hr at 11/07/18 1524, 20 mg at 11/07/18 1524 .  fentaNYL (SUBLIMAZE) bolus via infusion 50-100 mcg, 50-100 mcg, Intravenous, Q1H PRN, Awilda Bill, NP, 50 mcg at 11/07/18 0947 .  fentaNYL 2556mg in NS 2562m(1020mml) infusion-PREMIX, 0-250 mcg/hr, Intravenous, Continuous, Natasja Niday, MD, Last Rate: 25 mL/hr at 11/07/18 0700, 250 mcg/hr at 11/07/18 0700 .  furosemide (LASIX) injection 40 mg, 40 mg, Intravenous, Daily, AleLanney Ginsuad, MD, 40 mg at 11/07/18 1302 .  ipratropium-albuterol (DUONEB) 0.5-2.5 (3) MG/3ML nebulizer  solution 3 mL, 3 mL, Nebulization, Q6H, Tukov-Yual, Magdalene S, NP, 3 mL at 11/07/18 1405 .  levofloxacin (LEVAQUIN) IVPB 750 mg, 750 mg, Intravenous, Q24H, Chancellor Vanderloop, MD, Last Rate: 100 mL/hr at 11/07/18 1810, 750 mg at 11/07/18 1810 .  MEDLINE mouth rinse, 15  mL, Mouth Rinse, 10 times per day, Flora Lipps, MD, 15 mL at 11/07/18 1813 .  metroNIDAZOLE (FLAGYL) IVPB 500 mg, 500 mg, Intravenous, Q8H, Ravishankar, Jayashree, MD, Last Rate: 100 mL/hr at 11/07/18 1309, 500 mg at 11/07/18 1309 .  midazolam (VERSED) 50 mg/50 mL (1 mg/mL) premix infusion, 2 mg/hr, Intravenous, Continuous, Alfonza Toft, MD, Last Rate: 2 mL/hr at 11/07/18 1034, 2 mg/hr at 11/07/18 1034 .  midazolam (VERSED) injection 2-4 mg, 2-4 mg, Intravenous, Q4H PRN, Awilda Bill, NP, 4 mg at 11/07/18 0737 .  nicotine (NICODERM CQ - dosed in mg/24 hours) patch 21 mg, 21 mg, Transdermal, Daily, Lanney Gins, Ulysess Witz, MD, 21 mg at 11/07/18 1308 .  norepinephrine (LEVOPHED) 16 mg in 228m premix infusion, 0-40 mcg/min, Intravenous, Titrated, Tukov-Yual, Magdalene S, NP, Stopped at 11/06/18 1330 .  ondansetron (ZOFRAN) tablet 4 mg, 4 mg, Per NG tube, Q6H PRN **OR** ondansetron (ZOFRAN) injection 4 mg, 4 mg, Intravenous, Q6H PRN, Tukov-Yual, Magdalene S, NP .  polyethylene glycol (MIRALAX / GLYCOLAX) packet 17 g, 17 g, Oral, Daily PRN, Mayo, KPete Pelt MD .  polyethylene glycol (MIRALAX / GLYCOLAX) packet 17 g, 17 g, Per Tube, Q24H, ALanney Gins Kylani Wires, MD, 17 g at 11/07/18 1313 .  promethazine (PHENERGAN) injection 12.5 mg, 12.5 mg, Intravenous, Q6H PRN, Mayo, KPete Pelt MD, 12.5 mg at 11/01/18 1247 .  propofol (DIPRIVAN) 1000 MG/100ML infusion, 0-50 mcg/kg/min, Intravenous, Titrated, Tyshawna Alarid, MD, Last Rate: 26.8 mL/hr at 11/07/18 1638, 50.075 mcg/kg/min at 11/07/18 1638 .  senna-docusate (Senokot-S) tablet 2 tablet, 2 tablet, Per Tube, Q12H, AOttie Glazier MD, 2 tablet at 11/07/18 1311 .  sennosides (SENOKOT) 8.8 MG/5ML syrup 5 mL, 5 mL, Per Tube, BID PRN, Tukov-Yual, Magdalene S, NP, 5 mL at 11/02/18 1536 .  vancomycin (VANCOCIN) IVPB 1000 mg/200 mL premix, 1,000 mg, Intravenous, Q12H, Quincy Boy, MD, Last Rate: 200 mL/hr at 11/07/18 0908, 1,000 mg at 11/07/18 0908 .  venlafaxine  (EFFEXOR) tablet 75 mg, 75 mg, Per NG tube, BID WC, Tukov-Yual, Magdalene S, NP, 75 mg at 11/07/18 1318    ALLERGIES   Erythromycin base    REVIEW OF SYSTEMS     Unable to obtain ROS due to critical illness  PHYSICAL EXAMINATION   Vitals:   11/07/18 1526 11/07/18 1600  BP:  127/73  Pulse:    Resp:    Temp:  98.1 F (36.7 C)  SpO2: 96%     GENERAL: Sedated on mechanical ventilation HEAD: Normocephalic, atraumatic.  EYES: Pupils equal, round, reactive to light.  No scleral icterus.  MOUTH: Moist mucosal membrane. NECK: Supple. No thyromegaly. No nodules. No JVD.  PULMONARY: Bilateral crackles without rhonchorous breath sounds or wheezing CARDIOVASCULAR: S1 and S2. Regular rate and rhythm. No murmurs, rubs, or gallops.  GASTROINTESTINAL: Soft, nontender, non-distended. No masses. Positive bowel sounds. No hepatosplenomegaly.  MUSCULOSKELETAL: No swelling, clubbing, or edema.  NEUROLOGIC: Mild distress due to acute illness SKIN:intact,warm,dry   LABS AND IMAGING    LAB RESULTS: Recent Labs  Lab 11/05/18 0354 11/06/18 0357 11/07/18 0425  NA 140 140 139  K 3.1* 3.4* 3.3*  CL 103 102 101  CO2 _0 BUN _1 CREATININE 0.45 0.45 0.57  GLUCOSE 133* 117* 97   Recent Labs  Lab 11/05/18 0354 11/06/18 0357 11/07/18 0425  HGB 10.3* 10.1* 9.7*  HCT 30.9* 30.9* 29.8*  WBC 6.8 7.7 10.3  PLT 149* 183 210     IMAGING RESULTS: No results found.    ASSESSMENT AND PLAN     Acute Hypoxic Respiratory Failure- MODERATE ARDS PaO2/FiO2 <200 -Novel coronavirus negative -decreased SpO2 to <50%        Bronchoscopy with BAL-pending microbiology -continue Full MV support -continue Bronchodilator Therapy -Wean Fio2 and PEEP as tolerated -will perform SAT/SBT when respiratory parameters are met    Acute gastroenteritis-RESOLVED -Possible COVID related - results pending   CARDIAC FAILURE-resolved  Demand ischemia with mild troponin  elevation -Lasix as tolerated -follow up cardiac enzymes as indicated ICU monitoring   Renal Failure-resolved now -continue Foley Catheter-assess need daily   NEUROLOGY - intubated and sedated - minimal sedation to achieve a RASS goal: -1 Wake up assessment pending   ID- consult ordered- Appreciate input - poss GAS vs lumbar spine related infectious culprit -continue IV abx as prescibed -follow up cultures -MRI today - lumbar and thoracic- no discitis or para spinal abscess- pulmonary edema noted   GI/Nutrition GI PROPHYLAXIS as indicated DIET-->TF's as tolerated Constipation protocol as indicated  ENDO - ICU hypoglycemic\Hyperglycemia protocol -check FSBS per protocol   ELECTROLYTES -follow labs as needed -replace as needed -pharmacy consultation   DVT/GI PRX ordered -SCDs  TRANSFUSIONS AS NEEDED MONITOR FSBS ASSESS the need for LABS as needed   Critical care provider statement:  Critical care time (minutes):34 Critical care time was exclusive of: Separately billable procedures and treating other patients Critical care was necessary to treat or prevent imminent or life-threatening deterioration of the following conditions:acute hypoxemic respiratory failure, COVID19 rule out, COPD, AKI, demand ischemia , acute gastroenteritis Critical care was time spent personally by me on the following activities: Development of treatment plan with patient or surrogate, discussions with consultants, evaluation of patient's response to treatment, examination of patient, obtaining history from patient or surrogate, ordering and performing treatments and interventions, ordering and review of laboratory studies and re-evaluation of patient's condition. I assumed direction of critical care for this patient from another provider in my specialty: no   Ottie Glazier, M.D.  Division of [facility]

## 2018-11-07 NOTE — Progress Notes (Signed)
Pharmacy Electrolyte Monitoring Consult:  Pharmacy consulted to assist in monitoring and replacing electrolytes in this 63 y.o. female admitted on 11/01/2018 with Emesis   Labs:  Sodium (mmol/L)  Date Value  11/07/2018 139  03/16/2014 136   Potassium (mmol/L)  Date Value  11/07/2018 3.3 (L)  03/16/2014 3.8   Magnesium (mg/dL)  Date Value  11/07/2018 1.8   Phosphorus (mg/dL)  Date Value  11/07/2018 2.8   Calcium (mg/dL)  Date Value  11/07/2018 8.1 (L)   Calcium, Total (mg/dL)  Date Value  03/16/2014 9.0   Albumin (g/dL)  Date Value  11/06/2018 1.9 (L)  03/16/2014 3.8    Assessment/Plan: OG tube placed to suction on 4/2.   4/4  0534 K: 3.3, Mg: 1.8, Phos 2.8. Provider has already ordered KCL 33meq IV x 2 and Magnesium 2g IV x 1. Ordered KCL 48meq per tube.    Will replace for goal potassium ~ 4, goal magnesium ~ 2, and phosphate > 2.5.   Will obtain electrolytes with am labs.   Pharmacy will continue to monitor and adjust per consult.    Olivia Canter [facility] Clinical Pharmacist 11/07/2018 8:52 AM

## 2018-11-07 NOTE — Progress Notes (Addendum)
Patient has remained responsive to verbal stimuli.  She continues to become agitated when stimulated during care.  Patient remains in 4 point restraints.  Versed drip initiated which calmed patient and helped her to breathe along with the ventilator and rest.  Patient was transitioned from 60% to 50% FIO2.  Patient has remained in NSR.  Upon shift assessment lung sounds were coarse crackles.  Patient diuresed with 40 IV lasix, urine output this shift 2275.  Lung sounds diminished without crackles now.  RN has been regularly removing the restraints and repositioning the patient for comfort and safety. OG tube is correctly positioned and suction is intermittent-low.     Phillis Knack, RN

## 2018-11-08 ENCOUNTER — Inpatient Hospital Stay: Payer: Managed Care, Other (non HMO)

## 2018-11-08 LAB — BASIC METABOLIC PANEL WITH GFR
Anion gap: 9 (ref 5–15)
BUN: 7 mg/dL — ABNORMAL LOW (ref 8–23)
CO2: 30 mmol/L (ref 22–32)
Calcium: 7.6 mg/dL — ABNORMAL LOW (ref 8.9–10.3)
Chloride: 96 mmol/L — ABNORMAL LOW (ref 98–111)
Creatinine, Ser: 0.48 mg/dL (ref 0.44–1.00)
GFR calc Af Amer: >60 mL/min
GFR calc non Af Amer: >60 mL/min
Glucose, Bld: 142 mg/dL — ABNORMAL HIGH (ref 70–99)
Potassium: 2.8 mmol/L — ABNORMAL LOW (ref 3.5–5.1)
Sodium: 135 mmol/L (ref 135–145)

## 2018-11-08 LAB — GLUCOSE, CAPILLARY
Glucose-Capillary: 100 mg/dL — ABNORMAL HIGH (ref 70–99)
Glucose-Capillary: 102 mg/dL — ABNORMAL HIGH (ref 70–99)
Glucose-Capillary: 107 mg/dL — ABNORMAL HIGH (ref 70–99)
Glucose-Capillary: 110 mg/dL — ABNORMAL HIGH (ref 70–99)
Glucose-Capillary: 116 mg/dL — ABNORMAL HIGH (ref 70–99)
Glucose-Capillary: 143 mg/dL — ABNORMAL HIGH (ref 70–99)

## 2018-11-08 LAB — BASIC METABOLIC PANEL
Anion gap: 11 (ref 5–15)
BUN: 8 mg/dL (ref 8–23)
CO2: 30 mmol/L (ref 22–32)
Calcium: 7.5 mg/dL — ABNORMAL LOW (ref 8.9–10.3)
Chloride: 96 mmol/L — ABNORMAL LOW (ref 98–111)
Creatinine, Ser: 0.38 mg/dL — ABNORMAL LOW (ref 0.44–1.00)
GFR calc Af Amer: >60 mL/min (ref >60)
GFR calc non Af Amer: >60 mL/min (ref >60)
Glucose, Bld: 114 mg/dL — ABNORMAL HIGH (ref 70–99)
Potassium: 2.6 mmol/L — CL (ref 3.5–5.1)
Sodium: 137 mmol/L (ref 135–145)

## 2018-11-08 LAB — TRIGLYCERIDES: Triglycerides: 473 mg/dL — ABNORMAL HIGH (ref <150)

## 2018-11-08 LAB — MAGNESIUM
Magnesium: 1.4 mg/dL — ABNORMAL LOW (ref 1.7–2.4)
Magnesium: 1.8 mg/dL (ref 1.7–2.4)

## 2018-11-08 LAB — PHOSPHORUS
Phosphorus: 3.2 mg/dL (ref 2.5–4.6)
Phosphorus: 3 mg/dL (ref 2.5–4.6)

## 2018-11-08 LAB — POTASSIUM: Potassium: 2.7 mmol/L — CL (ref 3.5–5.1)

## 2018-11-08 MED ORDER — POTASSIUM CHLORIDE 20 MEQ PO PACK
40.0000 meq | PACK | ORAL | Status: AC
  Administered 2018-11-08 (×2): 40 meq
  Filled 2018-11-08 (×2): qty 2

## 2018-11-08 MED ORDER — POTASSIUM CHLORIDE 20 MEQ PO PACK
40.0000 meq | PACK | ORAL | Status: AC
  Administered 2018-11-08 – 2018-11-09 (×2): 40 meq
  Filled 2018-11-08 (×2): qty 2

## 2018-11-08 MED ORDER — ALBUMIN HUMAN 25 % IV SOLN
12.5000 g | Freq: Once | INTRAVENOUS | Status: AC
  Administered 2018-11-08: 12.5 g via INTRAVENOUS
  Filled 2018-11-08: qty 50

## 2018-11-08 MED ORDER — FUROSEMIDE 10 MG/ML IJ SOLN
80.0000 mg | Freq: Once | INTRAMUSCULAR | Status: AC
  Administered 2018-11-08: 80 mg via INTRAVENOUS
  Filled 2018-11-08: qty 8

## 2018-11-08 MED ORDER — FUROSEMIDE 10 MG/ML IJ SOLN
40.0000 mg | Freq: Two times a day (BID) | INTRAMUSCULAR | Status: DC
  Administered 2018-11-08 – 2018-11-13 (×10): 40 mg via INTRAVENOUS
  Filled 2018-11-08 (×10): qty 4

## 2018-11-08 MED ORDER — POTASSIUM CHLORIDE 10 MEQ/50ML IV SOLN
10.0000 meq | INTRAVENOUS | Status: AC
  Administered 2018-11-08 – 2018-11-09 (×4): 10 meq via INTRAVENOUS
  Filled 2018-11-08 (×4): qty 50

## 2018-11-08 MED ORDER — MAGNESIUM SULFATE 4 GM/100ML IV SOLN
4.0000 g | Freq: Once | INTRAVENOUS | Status: AC
  Administered 2018-11-08: 4 g via INTRAVENOUS
  Filled 2018-11-08: qty 100

## 2018-11-08 MED ORDER — POTASSIUM CHLORIDE 10 MEQ/50ML IV SOLN
10.0000 meq | INTRAVENOUS | Status: AC
  Administered 2018-11-08 (×2): 10 meq via INTRAVENOUS
  Filled 2018-11-08 (×2): qty 50

## 2018-11-08 NOTE — Progress Notes (Signed)
[facility] [place] NAME: Mikelle Lightsey    MR#:  [id number]  DATE OF BIRTH:  Oct 03, 1955  SUBJECTIVE:  CHIEF COMPLAINT:   Chief Complaint  Patient presents with  . Emesis   No new complaint reported .  Patient remains sedated on the vent.  Requiring four-point restraints for patient safety.  REVIEW OF SYSTEMS:  ROS Unobtainable due to patient being on the vent.  DRUG ALLERGIES:   Allergies  Allergen Reactions  . Erythromycin Base Swelling    Erythromycin ophthalmic ointment   VITALS:  Blood pressure (!) 161/78, pulse (!) 116, temperature 99.7 F (37.6 C), temperature source Axillary, resp. rate 17, height _0  (1.676 m), weight 88.9 kg, SpO2 96 %. PHYSICAL EXAMINATION:  Physical Exam  GENERAL:Patient sedated on the vent.  Requiring four-point restraints for patient safety  EYES: Pupils equal, round, reactive to light and accommodation. No scleral icterus. Extraocular muscles intact.  HEENT: Head atraumatic, normocephalic. Oropharynx and nasopharynx clear.ETT in place. NECK: Supple, no jugular venous distention. No thyroid enlargement, no tenderness.  LUNGS: +diffuse rhonchi, no wheezing, rales,or crepitation. No use of accessory muscles of respiration.  CARDIOVASCULAR: RRR,S1, S2 normal. No murmurs, rubs, or gallops.  ABDOMEN: Soft, nontender, nondistended. Bowel sounds present. No organomegaly or mass.  EXTREMITIES: No pedal edema, cyanosis, or clubbing.  NEUROLOGIC: unable to assess- intubated and sedated PSYCHIATRIC: unable to assess SKIN: No obvious rash, lesion, or ulcer.  LABORATORY PANEL:  Female CBC Recent Labs  Lab 11/07/18 0425  WBC 10.3  HGB 9.7*  HCT 29.8*  PLT 210   ------------------------------------------------------------------------------------------------------------------ Chemistries  Recent Labs  Lab 11/06/18 0357  11/08/18 0432  NA 140   < > 137  K 3.4*   < > 2.6*  CL 102   < > 96*  CO2  31   < > 30  GLUCOSE 117*   < > 114*  BUN 12   < > 8  CREATININE 0.45   < > 0.38*  CALCIUM 7.7*   < > 7.5*  MG 2.0   < > 1.4*  AST 31  --   --   ALT 42  --   --   ALKPHOS 41  --   --   BILITOT 0.7  --   --    < > = values in this interval not displayed.   RADIOLOGY:  Dg Chest Port 1 View  Result Date: 11/08/2018 CLINICAL DATA:  Respiratory distress. Intubated patient. Follow-up exam. EXAM: PORTABLE CHEST 1 VIEW COMPARISON:  Prior studies, most recent dated 11/06/2018. FINDINGS: There is been mild improvement in lung opacity since the prior study, with decreased opacity at the left lung base and less confluent opacity in the right upper lung. This apparent change may be due to larger lung volumes on the current exam. There are no new lung abnormalities. No pneumothorax. Endotracheal tube and nasal/orogastric tube are stable and well positioned. IMPRESSION: 1. Mild improvement in lung aeration is suggested, although this apparent change may be due to larger lung volumes on the current exam compared to the most recent prior study. 2. No new abnormalities. 3. Stable well-positioned support apparatus. Electronically Signed   By: Lajean Manes M.D.   On: 11/08/2018 08:37   ASSESSMENT AND PLAN:   Acute hypoxic respiratory failure and moderate ARDS -vent management per CCM -COVID testing is negative -Patient status post flexible bronchoscopy done on 11/06/2018.  So far no growth on respiratory cultures  Sepsis-  unclear etiology. No signs of UTI or pneumonia. -ID consulted- recommended MRI spine which was done on 11/06/2018 with no discitis.  -continue broad spectrum antibiotics per infectious disease specialist.  Appreciate input.  Patient currently on levofloxacin, Flagyl and vancomycin  Acute gastroenteritis- resolved. No additional episodes of vomiting.  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 Management plans discussed with the patient, family and they are in agreement.  CODE STATUS: Full Code  TOTAL TIME TAKING CARE OF THIS PATIENT: 35 minutes.   More than 50% of the time was spent in counseling/coordination of care: YES  POSSIBLE D/C IN 3 DAYS, DEPENDING ON CLINICAL CONDITION.   Henya Aguallo M.D on 11/08/2018 at 10:45 AM  Between 7am to 6pm - Pager - [phone]  After 6pm go to [email or web address] - Proofreader  [facility]  Office  [phone]  CC: Primary care physician; Katheren Shams  Note: This dictation was prepared with Dragon dictation along with smaller phrase technology. Any transcriptional errors that result from this process are unintentional.

## 2018-11-08 NOTE — Progress Notes (Signed)
CRITICAL CARE NOTE        SUBJECTIVE FINDINGS & SIGNIFICANT EVENTS   Patient has been diuresing well throughout the day, have added albumin to help with efficacy due to lack of nourishment since admission.  Status post large volume aspiration -Have not attempted to liberate off mechanical ventilation due to significant pulmonary edema and severe agitation on maximal doses of sedatives  PAST MEDICAL HISTORY   Past Medical History:  Diagnosis Date  . Anxiety   . Cancer ([place])    choroid melanoma left eye  . GERD (gastroesophageal reflux disease)   . Psoriasis      SURGICAL HISTORY   Past Surgical History:  Procedure Laterality Date  . ABDOMINAL HYSTERECTOMY    . BICEPT TENODESIS Right 12/18/2017   Procedure: BICEPS TENODESIS;  Surgeon: Leim Fabry, MD;  Location: [facility];  Service: Orthopedics;  Laterality: Right;  . BRAIN TUMOR EXCISION  2007  . COLONOSCOPY    . ESOPHAGOGASTRODUODENOSCOPY    . EYE SURGERY  2012  . MOHS SURGERY  2018  . SHOULDER ARTHROSCOPY WITH OPEN ROTATOR CUFF REPAIR Right 12/18/2017   Procedure: SHOULDER ARTHROSCOPY WITH OPEN ROTATOR CUFF REPAIR,DISTAL CLAVICLE EXCISION,SUBACROMIAL DECOMPRESSION;  Surgeon: Leim Fabry, MD;  Location: [facility];  Service: Orthopedics;  Laterality: Right;     FAMILY HISTORY   Family History  Problem Relation Age of Onset  . Heart attack Father   . Lung cancer Father      SOCIAL HISTORY   Social History   Tobacco Use  . Smoking status: Current Every Day Smoker    Packs/day: 1.00  . Smokeless tobacco: Never Used  Substance Use Topics  . Alcohol use: Yes    Frequency: Never    Comment: occasionally  . Drug use: Never     MEDICATIONS   Current Medication:  Current Facility-Administered Medications:  .  acetaminophen (TYLENOL)  tablet 650 mg, 650 mg, Per Tube, Q6H PRN, 650 mg at 11/05/18 0852 **OR** acetaminophen (TYLENOL) suppository 650 mg, 650 mg, Rectal, Q6H PRN, Ottie Glazier, MD .  bisacodyl (DULCOLAX) suppository 10 mg, 10 mg, Rectal, Daily PRN, Tukov-Yual, Magdalene S, NP .  budesonide (PULMICORT) nebulizer solution 0.5 mg, 0.5 mg, Nebulization, BID, Tukov-Yual, Magdalene S, NP, 0.5 mg at 11/08/18 0717 .  chlorhexidine gluconate (MEDLINE KIT) (PERIDEX) 0.12 % solution 15 mL, 15 mL, Mouth Rinse, BID, Kasa, Kurian, MD, 15 mL at 11/08/18 0742 .  Chlorhexidine Gluconate Cloth 2 % PADS 6 each, 6 each, Topical, Daily, Flora Lipps, MD, 6 each at 11/07/18 1330 .  dexmedetomidine (PRECEDEX) 400 MCG/100ML (4 mcg/mL) infusion, 0.4-1.2 mcg/kg/hr, Intravenous, Titrated, Blakeney, Dreama Saa, NP, Last Rate: 27 mL/hr at 11/08/18 1338, 1.2 mcg/kg/hr at 11/08/18 1338 .  enoxaparin (LOVENOX) injection 40 mg, 40 mg, Subcutaneous, Q24H, Lanney Gins, Armanie Martine, MD, 40 mg at 11/07/18 2144 .  famotidine (PEPCID) IVPB 20 mg premix, 20 mg, Intravenous, Q12H, Aivan Fillingim, MD, Last Rate: 100 mL/hr at 11/08/18 0947, 20 mg at 11/08/18 0947 .  fentaNYL (SUBLIMAZE) bolus via infusion 50-100 mcg, 50-100 mcg, Intravenous, Q1H PRN, Awilda Bill, NP, 100 mcg at 11/08/18 1223 .  fentaNYL 2564mg in NS 2531m(1039mml) infusion-PREMIX, 0-400 mcg/hr, Intravenous, Continuous, Dyesha Henault, MD, Last Rate: 27.5 mL/hr at 11/08/18 1344, 275 mcg/hr at 11/08/18 1344 .  furosemide (LASIX) injection 40 mg, 40 mg, Intravenous, Daily, AleOttie GlazierD, 40 mg at 11/08/18 0943 .  ipratropium-albuterol (DUONEB) 0.5-2.5 (3) MG/3ML nebulizer solution 3 mL, 3 mL, Nebulization, Q6H, Tukov-Yual, Magdalene S, NP,  3 mL at 11/08/18 0717 .  levofloxacin (LEVAQUIN) IVPB 750 mg, 750 mg, Intravenous, Q24H, Lonald Troiani, MD, Last Rate: 100 mL/hr at 11/07/18 1810, 750 mg at 11/07/18 1810 .  MEDLINE mouth rinse, 15 mL, Mouth Rinse, 10 times per day, Flora Lipps, MD, 15 mL at  11/08/18 0954 .  metroNIDAZOLE (FLAGYL) IVPB 500 mg, 500 mg, Intravenous, Q8H, Ravishankar, Jayashree, MD, Last Rate: 100 mL/hr at 11/08/18 1237, 500 mg at 11/08/18 1237 .  midazolam (VERSED) 50 mg/50 mL (1 mg/mL) premix infusion, 3 mg/hr, Intravenous, Continuous, Blakeney, Dana G, NP, Last Rate: 3 mL/hr at 11/08/18 1355, 3 mg/hr at 11/08/18 1355 .  midazolam (VERSED) injection 2-4 mg, 2-4 mg, Intravenous, Q4H PRN, Awilda Bill, NP, 2 mg at 11/08/18 1242 .  nicotine (NICODERM CQ - dosed in mg/24 hours) patch 21 mg, 21 mg, Transdermal, Daily, Ottie Glazier, MD, 21 mg at 11/08/18 0949 .  norepinephrine (LEVOPHED) 16 mg in 243m premix infusion, 0-40 mcg/min, Intravenous, Titrated, Tukov-Yual, Magdalene S, NP, Stopped at 11/06/18 1330 .  ondansetron (ZOFRAN) tablet 4 mg, 4 mg, Per NG tube, Q6H PRN **OR** ondansetron (ZOFRAN) injection 4 mg, 4 mg, Intravenous, Q6H PRN, Tukov-Yual, Magdalene S, NP .  polyethylene glycol (MIRALAX / GLYCOLAX) packet 17 g, 17 g, Oral, Daily PRN, Mayo, KPete Pelt MD .  polyethylene glycol (MIRALAX / GLYCOLAX) packet 17 g, 17 g, Per Tube, Q24H, ALanney Gins Juniper Cobey, MD, 17 g at 11/08/18 1021 .  promethazine (PHENERGAN) injection 12.5 mg, 12.5 mg, Intravenous, Q6H PRN, Mayo, KPete Pelt MD, 12.5 mg at 11/01/18 1247 .  propofol (DIPRIVAN) 1000 MG/100ML infusion, 0-50 mcg/kg/min, Intravenous, Titrated, Yanna Leaks, MD, Last Rate: 26.8 mL/hr at 11/08/18 1135, 50.075 mcg/kg/min at 11/08/18 1135 .  senna-docusate (Senokot-S) tablet 2 tablet, 2 tablet, Per Tube, Q12H, AOttie Glazier MD, 2 tablet at 11/08/18 1022 .  sennosides (SENOKOT) 8.8 MG/5ML syrup 5 mL, 5 mL, Per Tube, BID PRN, Tukov-Yual, Magdalene S, NP, 5 mL at 11/02/18 1536 .  vancomycin (VANCOCIN) IVPB 1000 mg/200 mL premix, 1,000 mg, Intravenous, Q12H, Angellina Ferdinand, MD, Last Rate: 200 mL/hr at 11/08/18 1020, 1,000 mg at 11/08/18 1020 .  venlafaxine (EFFEXOR) tablet 75 mg, 75 mg, Per NG tube, BID WC, Tukov-Yual,  Magdalene S, NP, 75 mg at 11/08/18 1022    ALLERGIES   Erythromycin base    REVIEW OF SYSTEMS     Unable to obtain due to acutely ill state on mechanical ventilation  PHYSICAL EXAMINATION   Vitals:   11/08/18 1300 11/08/18 1400  BP: (!) 113/58 126/66  Pulse: 73 68  Resp: 15 15  Temp:    SpO2:  99%    GENERAL: Sedated on mechanical ventilation HEAD: Normocephalic, atraumatic.  EYES: Pupils equal, round, reactive to light.  No scleral icterus.  MOUTH: Moist mucosal membrane. NECK: Supple. No thyromegaly. No nodules. No JVD.  PULMONARY: Bilateral crackles CARDIOVASCULAR: S1 and S2. Regular rate and rhythm. No murmurs, rubs, or gallops.  GASTROINTESTINAL: Soft, nontender, non-distended. No masses. Positive bowel sounds. No hepatosplenomegaly.  MUSCULOSKELETAL: No swelling, clubbing, or edema.  NEUROLOGIC: Mild distress due to acute illness SKIN:intact,warm,dry   LABS AND IMAGING     LAB RESULTS: Recent Labs  Lab 11/07/18 0425 11/08/18 0432 11/08/18 1230  NA 139 137 135  K 3.3* 2.6* 2.8*  CL 101 96* 96*  CO2 _0 BUN 11 8 7*  CREATININE 0.57 0.38* 0.48  GLUCOSE 97 114* 142*   Recent Labs  Lab 11/05/18 0354 11/06/18  0357 11/07/18 0425  HGB 10.3* 10.1* 9.7*  HCT 30.9* 30.9* 29.8*  WBC 6.8 7.7 10.3  PLT 149* 183 210     IMAGING RESULTS: Dg Chest Port 1 View  Result Date: 11/08/2018 CLINICAL DATA:  Respiratory distress. Intubated patient. Follow-up exam. EXAM: PORTABLE CHEST 1 VIEW COMPARISON:  Prior studies, most recent dated 11/06/2018. FINDINGS: There is been mild improvement in lung opacity since the prior study, with decreased opacity at the left lung base and less confluent opacity in the right upper lung. This apparent change may be due to larger lung volumes on the current exam. There are no new lung abnormalities. No pneumothorax. Endotracheal tube and nasal/orogastric tube are stable and well positioned. IMPRESSION: 1. Mild improvement in  lung aeration is suggested, although this apparent change may be due to larger lung volumes on the current exam compared to the most recent prior study. 2. No new abnormalities. 3. Stable well-positioned support apparatus. Electronically Signed   By: Lajean Manes M.D.   On: 11/08/2018 08:37      ASSESSMENT AND PLAN    Acute Hypoxic Respiratory Failure- MODERATE ARDS PaO2/FiO2 <200 -Novel coronavirus negative -decreased SpO2 to <50% Bronchoscopy with BAL-pending microbiology    -aggressively diuresing with 2875 cc urine output from 7a-7p today however still 15 L net positive since admit   Acute gastroenteritis-RESOLVED -Possible COVID related - results pending   CARDIAC FAILURE-resolved Demand ischemia with mild troponin elevation -Lasix as tolerated -follow up cardiac enzymes as indicated ICU monitoring   Renal Failure-resolved now -continue Foley Catheter-assess need daily   NEUROLOGY - intubated and sedated - minimal sedation to achieve a RASS goal: -1 Wake up assessment pending   ID- consult ordered- Appreciate input - poss GAS vs lumbar spine related infectious culprit -continue IV abx as prescibed -follow up cultures -MRI today - lumbar and thoracic- no discitis or para spinal abscess- pulmonary edema noted   GI/Nutrition GI PROPHYLAXIS as indicated DIET-->TF's as tolerated Constipation protocol as indicated  ENDO - ICU hypoglycemic\Hyperglycemia protocol -check FSBS per protocol   ELECTROLYTES -follow labs as needed -replace as needed -pharmacy consultation   DVT/GI PRX ordered -SCDs  TRANSFUSIONS AS NEEDED MONITOR FSBS ASSESS the need for LABS as needed   Critical care provider statement:  Critical care time (minutes):34 Critical care time was exclusive of: Separately billable procedures and treating other patients Critical care was necessary to treat or prevent imminent or life-threatening  deterioration of the following conditions:acute hypoxemic respiratory failure, COVID19 rule out, COPD, AKI, demand ischemia , acute gastroenteritis Critical care was time spent personally by me on the following activities: Development of treatment plan with patient or surrogate, discussions with consultants, evaluation of patient's response to treatment, examination of patient, obtaining history from patient or surrogate, ordering and performing treatments and interventions, ordering and review of laboratory studies and re-evaluation of patient's condition. I assumed direction of critical care for this patient from another provider in my specialty: no    This document was prepared using Dragon voice recognition software and may include unintentional dictation errors.    Ottie Glazier, M.D.  Division of [facility]

## 2018-11-08 NOTE — Progress Notes (Signed)
Pharmacy Electrolyte Monitoring Consult:  Pharmacy consulted to assist in monitoring and replacing electrolytes in this 63 y.o. female admitted on 11/01/2018 with Emesis   Labs:  Sodium (mmol/L)  Date Value  11/08/2018 137  03/16/2014 136   Potassium (mmol/L)  Date Value  11/08/2018 2.6 (LL)  03/16/2014 3.8   Magnesium (mg/dL)  Date Value  11/08/2018 1.4 (L)   Phosphorus (mg/dL)  Date Value  11/08/2018 3.2   Calcium (mg/dL)  Date Value  11/08/2018 7.5 (L)   Calcium, Total (mg/dL)  Date Value  03/16/2014 9.0   Albumin (g/dL)  Date Value  11/06/2018 1.9 (L)  03/16/2014 3.8    Assessment/Plan: OG tube placed to suction on 4/2.   Will replace for goal potassium ~ 4, goal magnesium ~ 2, and phosphate > 2.5.   4/5: K 2.6, Mg: 1.4. Will replace with 76mEq every 4 hours x 2 per tube, KCL 53mEq IV x2 and Magnesium 4g IV x 1 dose. Will F/U K+ level @ 1800 this evening.   Will obtain electrolytes with am labs.   Pharmacy will continue to monitor and adjust per consult.    Pernell Dupre, PharmD, BCPS Clinical Pharmacist 11/08/2018 5:41 AM

## 2018-11-08 NOTE — Progress Notes (Signed)
Pharmacy Electrolyte Monitoring Consult:  Pharmacy consulted to assist in monitoring and replacing electrolytes in this 63 y.o. female admitted on 11/01/2018 with Emesis   Labs:  Sodium (mmol/L)  Date Value  11/08/2018 135  03/16/2014 136   Potassium (mmol/L)  Date Value  11/08/2018 2.7 (LL)  03/16/2014 3.8   Magnesium (mg/dL)  Date Value  11/08/2018 1.8   Phosphorus (mg/dL)  Date Value  11/08/2018 3.0   Calcium (mg/dL)  Date Value  11/08/2018 7.6 (L)   Calcium, Total (mg/dL)  Date Value  03/16/2014 9.0   Albumin (g/dL)  Date Value  11/06/2018 1.9 (L)  03/16/2014 3.8    Assessment/Plan: OG tube placed to suction on 4/2.   Will replace for goal potassium ~ 4, goal magnesium ~ 2, and phosphate > 2.5.   4/5: K 2.7, Mg: 1.8. Will replace with 75mEq every 4 hours x 2 per tube, KCL 50mEq IV x 4. Will F/U K+ level with AM labs    Pharmacy will continue to monitor and adjust per consult.    Oswald Hillock, PharmD, BCPS Clinical Pharmacist 11/08/2018 8:55 PM

## 2018-11-09 ENCOUNTER — Inpatient Hospital Stay: Payer: Managed Care, Other (non HMO)

## 2018-11-09 DIAGNOSIS — Z720 Tobacco use: Secondary | ICD-10-CM

## 2018-11-09 DIAGNOSIS — J690 Pneumonitis due to inhalation of food and vomit: Secondary | ICD-10-CM

## 2018-11-09 LAB — GLUCOSE, CAPILLARY
Glucose-Capillary: 103 mg/dL — ABNORMAL HIGH (ref 70–99)
Glucose-Capillary: 106 mg/dL — ABNORMAL HIGH (ref 70–99)
Glucose-Capillary: 106 mg/dL — ABNORMAL HIGH (ref 70–99)
Glucose-Capillary: 110 mg/dL — ABNORMAL HIGH (ref 70–99)
Glucose-Capillary: 114 mg/dL — ABNORMAL HIGH (ref 70–99)
Glucose-Capillary: 117 mg/dL — ABNORMAL HIGH (ref 70–99)

## 2018-11-09 LAB — BASIC METABOLIC PANEL
Anion gap: 10 (ref 5–15)
BUN: 9 mg/dL (ref 8–23)
CO2: 31 mmol/L (ref 22–32)
Calcium: 7.8 mg/dL — ABNORMAL LOW (ref 8.9–10.3)
Chloride: 94 mmol/L — ABNORMAL LOW (ref 98–111)
Creatinine, Ser: 0.49 mg/dL (ref 0.44–1.00)
GFR calc Af Amer: >60 mL/min (ref >60)
GFR calc non Af Amer: >60 mL/min (ref >60)
Glucose, Bld: 122 mg/dL — ABNORMAL HIGH (ref 70–99)
Potassium: 3.6 mmol/L (ref 3.5–5.1)
Sodium: 135 mmol/L (ref 135–145)

## 2018-11-09 LAB — CBC
HCT: 29.7 % — ABNORMAL LOW (ref 36.0–46.0)
Hemoglobin: 9.9 g/dL — ABNORMAL LOW (ref 12.0–15.0)
MCH: 30.8 pg (ref 26.0–34.0)
MCHC: 33.3 g/dL (ref 30.0–36.0)
MCV: 92.5 fL (ref 80.0–100.0)
Platelets: 258 10*3/uL (ref 150–400)
RBC: 3.21 MIL/uL — ABNORMAL LOW (ref 3.87–5.11)
RDW: 13 % (ref 11.5–15.5)
WBC: 8.4 10*3/uL (ref 4.0–10.5)
nRBC: 0 % (ref 0.0–0.2)

## 2018-11-09 LAB — PHOSPHORUS: Phosphorus: 3.4 mg/dL (ref 2.5–4.6)

## 2018-11-09 LAB — MAGNESIUM: Magnesium: 1.6 mg/dL — ABNORMAL LOW (ref 1.7–2.4)

## 2018-11-09 LAB — CULTURE, RESPIRATORY W GRAM STAIN

## 2018-11-09 LAB — TRIGLYCERIDES: Triglycerides: 390 mg/dL — ABNORMAL HIGH (ref <150)

## 2018-11-09 MED ORDER — FENTANYL 100 MCG/HR TD PT72
1.0000 | MEDICATED_PATCH | TRANSDERMAL | Status: DC
  Administered 2018-11-09 – 2018-11-12 (×2): 1 via TRANSDERMAL
  Filled 2018-11-09 (×2): qty 1

## 2018-11-09 MED ORDER — MAGNESIUM SULFATE 2 GM/50ML IV SOLN
2.0000 g | Freq: Once | INTRAVENOUS | Status: AC
  Administered 2018-11-09: 2 g via INTRAVENOUS
  Filled 2018-11-09: qty 50

## 2018-11-09 MED ORDER — POTASSIUM CHLORIDE 20 MEQ PO PACK
40.0000 meq | PACK | Freq: Once | ORAL | Status: AC
  Administered 2018-11-09: 10:00:00 40 meq
  Filled 2018-11-09: qty 2

## 2018-11-09 MED ORDER — RISPERIDONE 0.5 MG PO TABS
0.5000 mg | ORAL_TABLET | Freq: Two times a day (BID) | ORAL | Status: DC
  Administered 2018-11-09 – 2018-11-11 (×5): 0.5 mg
  Filled 2018-11-09 (×6): qty 1

## 2018-11-09 MED ORDER — VITAL HIGH PROTEIN PO LIQD
1000.0000 mL | ORAL | Status: DC
  Administered 2018-11-09 – 2018-11-10 (×2): 1000 mL

## 2018-11-09 MED ORDER — CLONAZEPAM 1 MG PO TABS
1.0000 mg | ORAL_TABLET | Freq: Two times a day (BID) | ORAL | Status: DC
  Administered 2018-11-09 – 2018-11-11 (×5): 1 mg
  Filled 2018-11-09 (×5): qty 1

## 2018-11-09 MED ORDER — METOCLOPRAMIDE HCL 5 MG/ML IJ SOLN
5.0000 mg | Freq: Four times a day (QID) | INTRAMUSCULAR | Status: DC
  Administered 2018-11-09 – 2018-11-13 (×16): 5 mg via INTRAVENOUS
  Filled 2018-11-09 (×16): qty 2

## 2018-11-09 NOTE — Progress Notes (Signed)
Follow up - Critical Care Medicine Note  Patient Details:    Cindy Morrison is an 63 y.o. female. Admitted on 29 March with acute respiratory failure, required intubation.  She had protracted nausea and vomiting.  She has had severe issues with agitation and delirium which have limited weaning from the ventilator. Lines, Airways, Drains: Airway 7 mm (Active)  Secured at (cm) 23 cm 11/09/2018  8:12 PM  Measured From Lips 11/09/2018  8:12 PM  Secured Location Right 11/09/2018  8:12 PM  Secured By [employer] 11/09/2018  8:12 PM  Tube Holder Repositioned Yes 11/09/2018  8:12 PM  Cuff Pressure (cm H2O) 28 cm H2O 11/09/2018  8:12 PM  Site Condition Dry 11/09/2018  8:12 PM     CVC Triple Lumen 11/01/18 Right (Active)  Indication for Insertion or Continuance of Line Vasoactive infusions 11/09/2018 10:00 PM  Site Assessment Clean;Dry;Intact 11/09/2018 10:00 PM  Proximal Lumen Status Infusing 11/09/2018 10:00 PM  Medial Lumen Status Infusing 11/09/2018 10:00 PM  Distal Lumen Status Infusing 11/09/2018 10:00 PM  Dressing Type Transparent;Occlusive 11/09/2018 10:00 PM  Dressing Status Clean;Dry;Intact;Antimicrobial disc in place 11/09/2018 10:00 PM  Line Care Connections checked and tightened 11/09/2018 10:00 PM  Dressing Intervention Dressing changed 11/09/2018 10:00 PM  Dressing Change Due 11/16/18 11/09/2018 10:00 PM     NG/OG Tube Orogastric Center mouth Xray  (Active)  Cm Marking at Nare/Corner of Mouth (if applicable) 68 cm 09/05/2246  8:00 PM  Site Assessment Clean;Dry;Intact 11/09/2018  8:00 PM  Ongoing Placement Verification No change in cm markings or external length of tube from initial placement;No change in respiratory status;No acute changes, not attributed to clinical condition 11/09/2018  8:00 PM  Status Infusing tube feed 11/09/2018  8:00 PM  Amount of suction 80 mmHg 11/09/2018  4:00 PM  Drainage Appearance Bile;Brown 11/09/2018  4:00 PM  Intake (mL) 140 mL 11/05/2018  8:00 AM  Output (mL) 130 mL 11/09/2018  1:41  PM     Urethral Catheter Britt Boozer, RN Double-lumen;Latex 14 Fr. (Active)  Indication for Insertion or Continuance of Catheter Unstable critically ill patients first 24-48 hours (See Criteria) 11/09/2018  8:00 PM  Site Assessment Clean;Intact;Dry 11/09/2018  8:00 PM  Catheter Maintenance Bag below level of bladder;Catheter secured;Drainage bag/tubing not touching floor;Insertion date on drainage bag;No dependent loops;Seal intact 11/09/2018  8:00 PM  Collection Container Standard drainage bag 11/09/2018  8:00 PM  Securement Method Securing device (Describe) 11/09/2018  8:00 PM  Urinary Catheter Interventions Unclamped 11/09/2018  8:00 PM  Output (mL) 450 mL 11/09/2018  8:30 PM    Anti-infectives:  Anti-infectives (From admission, onward)   Start     Dose/Rate Route Frequency Ordered Stop   11/07/18 1800  levofloxacin (LEVAQUIN) IVPB 750 mg     750 mg 100 mL/hr over 90 Minutes Intravenous Every 24 hours 11/06/18 2027     11/06/18 1915  levofloxacin (LEVAQUIN) IVPB 750 mg  Status:  Discontinued     750 mg 100 mL/hr over 90 Minutes Intravenous Every 24 hours 11/06/18 1913 11/06/18 2027   11/04/18 2000  vancomycin (VANCOCIN) 1,000 mg in sodium chloride 0.9 % 250 mL IVPB  Status:  Discontinued     1,000 mg 250 mL/hr over 60 Minutes Intravenous Every 12 hours 11/04/18 0936 11/04/18 0941   11/04/18 2000  vancomycin (VANCOCIN) IVPB 1000 mg/200 mL premix  Status:  Discontinued     1,000 mg 200 mL/hr over 60 Minutes Intravenous Every 12 hours 11/04/18 0941 11/09/18 1543  11/04/18 2000  ceFEPIme (MAXIPIME) 2 g in sodium chloride 0.9 % 100 mL IVPB  Status:  Discontinued     2 g 200 mL/hr over 30 Minutes Intravenous Every 8 hours 11/04/18 1150 11/06/18 1913   11/04/18 0200  vancomycin (VANCOCIN) IVPB 750 mg/150 ml premix  Status:  Discontinued     750 mg 150 mL/hr over 60 Minutes Intravenous Every 8 hours 11/03/18 2136 11/04/18 0936   11/03/18 2200  piperacillin-tazobactam (ZOSYN) IVPB 3.375 g  Status:   Discontinued     3.375 g 12.5 mL/hr over 240 Minutes Intravenous Every 8 hours 11/03/18 1729 11/03/18 1748   11/03/18 2000  doxycycline (VIBRAMYCIN) 100 mg in sodium chloride 0.9 % 250 mL IVPB  Status:  Discontinued     100 mg 125 mL/hr over 120 Minutes Intravenous Every 12 hours 11/03/18 1719 11/03/18 1748   11/03/18 2000  ceFEPIme (MAXIPIME) 2 g in sodium chloride 0.9 % 100 mL IVPB  Status:  Discontinued     2 g 200 mL/hr over 30 Minutes Intravenous Every 12 hours 11/03/18 1748 11/04/18 1150   11/03/18 2000  metroNIDAZOLE (FLAGYL) IVPB 500 mg     500 mg 100 mL/hr over 60 Minutes Intravenous Every 8 hours 11/03/18 1748     11/03/18 1800  vancomycin (VANCOCIN) 1,500 mg in sodium chloride 0.9 % 500 mL IVPB     1,500 mg 250 mL/hr over 120 Minutes Intravenous  Once 11/03/18 1748 11/04/18 0700   11/03/18 0800  meropenem (MERREM) 1 g in sodium chloride 0.9 % 100 mL IVPB  Status:  Discontinued     1 g 200 mL/hr over 30 Minutes Intravenous Every 8 hours 11/03/18 0626 11/03/18 1729   11/02/18 2230  meropenem (MERREM) 1 g in sodium chloride 0.9 % 100 mL IVPB  Status:  Discontinued     1 g 200 mL/hr over 30 Minutes Intravenous Every 12 hours 11/02/18 2222 11/03/18 0626   11/02/18 2215  vancomycin (VANCOCIN) IVPB 750 mg/150 ml premix  Status:  Discontinued     750 mg 150 mL/hr over 60 Minutes Intravenous  Once 11/02/18 2211 11/02/18 2212   11/02/18 2211  vancomycin variable dose per unstable renal function (pharmacist dosing)  Status:  Discontinued      Does not apply See admin instructions 11/02/18 2211 11/02/18 2212   11/02/18 2000  cefTRIAXone (ROCEPHIN) 2 g in sodium chloride 0.9 % 100 mL IVPB  Status:  Discontinued     2 g 200 mL/hr over 30 Minutes Intravenous Every 24 hours 11/02/18 1111 11/02/18 2222   11/02/18 2000  doxycycline (VIBRAMYCIN) 200 mg in dextrose 5 % 250 mL IVPB  Status:  Discontinued     200 mg 125 mL/hr over 120 Minutes Intravenous Every 12 hours 11/02/18 1111 11/03/18 1719    11/01/18 1730  cefTRIAXone (ROCEPHIN) 2 g in sodium chloride 0.9 % 100 mL IVPB  Status:  Discontinued     2 g 200 mL/hr over 30 Minutes Intravenous Every 24 hours 11/01/18 1727 11/02/18 1111   11/01/18 1730  doxycycline (VIBRAMYCIN) 200 mg in dextrose 5 % 250 mL IVPB  Status:  Discontinued     200 mg 125 mL/hr over 120 Minutes Intravenous Every 12 hours 11/01/18 1727 11/02/18 1111      Microbiology: Results for orders placed or performed during the hospital encounter of 11/01/18  Novel Coronavirus, NAA (hospital order; send-out to ref lab)     Status: None   Collection Time: 11/01/18  3:08 PM  Result Value Ref Range Status   SARS-CoV-2, NAA NOT DETECTED NOT DETECTED Final    Comment: Negative (Not Detected) results do not exclude infection caused by SARS CoV 2 and should not be used as the sole basis for treatment or other patient management decisions. Optimum specimen types and timing for peak viral levels during infections caused  by SARS CoV 2 have not been determined. Collection of multiple specimens (types and time points) from the same patient may be necessary to detect the virus. Improper specimen collection and handling, sequence variability underlying assay primers and or probes, or the presence of organisms in  quantities less than the limit of detection of the assay may lead to false negative results. Positive and negative predictive values of testing are highly dependent on prevalence. False negative results are more likely when prevalence of disease is high. (NOTE) The expected result is Negative (Not Detected). The SARS CoV 2 test is intended for the presumptive qualitative  detection of nucleic acid from SARS CoV 2 in upper and lower  respir atory specimens. Testing methodology is real time RT PCR. Test results must be correlated with clinical presentation and  evaluated in the context of other laboratory and epidemiologic data.  Test performance can be affected because  the epidemiology and  clinical spectrum of infection caused by SARS CoV 2 is not fully  known. For example, the optimum types of specimens to collect and  when during the course of infection these specimens are most likely  to contain detectable viral RNA may not be known. This test has not been Food and Drug Administration (FDA) cleared or  approved and has been authorized by FDA under an Emergency Use  Authorization (EUA). The test is only authorized for the duration of  the declaration that circumstances exist justifying the authorization  of emergency use of in vitro diagnostic tests for detection and or  diagnosis of SARS CoV 2 under Section 564(b)(1) of the Act, 21 U.S.C.  section [phone] 3(b)(1), unless the authorization is terminated or   revoked sooner. [facility] is certified under the  Clinical Laboratory Improvement Amendments of 1988 (CLIA), 42 U.S.C.  section [phone], to perform high complexity tests. Performed at [facility] [id number], [place], [place] [address] Laboratory Director: Loleta Books, MD Performed at [facility], [place] [address], [place], [place] [address]    Coronavirus Source NASOPHARYNGEAL  Final    Comment: Performed at [facility], [address] [place], [place] [address]  Respiratory Panel by PCR     Status: None   Collection Time: 11/02/18 12:01 AM  Result Value Ref Range Status   Adenovirus NOT DETECTED NOT DETECTED Final   Coronavirus 229E NOT DETECTED NOT DETECTED Final    Comment: (NOTE) The Coronavirus on the Respiratory Panel, DOES NOT test for the novel  Coronavirus (2019 nCoV)    Coronavirus HKU1 NOT DETECTED NOT DETECTED Final   Coronavirus NL63 NOT DETECTED NOT DETECTED Final   Coronavirus OC43 NOT DETECTED NOT DETECTED Final   Metapneumovirus NOT DETECTED NOT DETECTED Final   Rhinovirus / Enterovirus NOT DETECTED NOT DETECTED Final    Influenza A NOT DETECTED NOT DETECTED Final   Influenza B NOT DETECTED NOT DETECTED Final   Parainfluenza Virus 1 NOT DETECTED NOT DETECTED Final   Parainfluenza Virus 2 NOT DETECTED NOT DETECTED Final   Parainfluenza Virus 3 NOT DETECTED NOT DETECTED Final  Parainfluenza Virus 4 NOT DETECTED NOT DETECTED Final   Respiratory Syncytial Virus NOT DETECTED NOT DETECTED Final   Bordetella pertussis NOT DETECTED NOT DETECTED Final   Chlamydophila pneumoniae NOT DETECTED NOT DETECTED Final   Mycoplasma pneumoniae NOT DETECTED NOT DETECTED Final    Comment: Performed at [facility], [place] [address], [place], [place] [address]  MRSA PCR Screening     Status: None   Collection Time: 11/02/18 12:01 AM  Result Value Ref Range Status   MRSA by PCR NEGATIVE NEGATIVE Final    Comment:        The GeneXpert MRSA Assay (FDA approved for NASAL specimens only), is one component of a comprehensive MRSA colonization surveillance program. It is not intended to diagnose MRSA infection nor to guide or monitor treatment for MRSA infections. Performed at [facility], [address] [place], [place] [address]   Culture, blood (Routine X 2) w Reflex to ID Panel     Status: None   Collection Time: 11/02/18  2:40 AM  Result Value Ref Range Status   Specimen Description BLOOD RIGHT WRIST  Final   Special Requests   Final    BOTTLES DRAWN AEROBIC AND ANAEROBIC Blood Culture adequate volume   Culture   Final    NO GROWTH 5 DAYS Performed at [facility], [address], [place], [place] [address]    Report Status 11/07/2018 FINAL  Final  Culture, blood (Routine X 2) w Reflex to ID Panel     Status: None   Collection Time: 11/02/18  2:47 AM  Result Value Ref Range Status   Specimen Description BLOOD RIGHT HAND  Final   Special Requests   Final    BOTTLES DRAWN AEROBIC AND ANAEROBIC Blood Culture adequate volume   Culture   Final    NO GROWTH 5 DAYS Performed at [facility], [address] [place], [place] [address]    Report Status 11/07/2018 FINAL  Final  Culture, respiratory (non-expectorated)     Status: None   Collection Time: 11/06/18  1:52 PM  Result Value Ref Range Status   Specimen Description   Final    BRONCHIAL ALVEOLAR LAVAGE Performed at [facility], [address], [place], [place] [address]    Special Requests   Final    NONE Performed at [facility], [address] [place], [place] [address]    Gram Stain   Final    ABUNDANT WBC PRESENT,BOTH PMN AND MONONUCLEAR NO ORGANISMS SEEN Performed at [facility], [place] [address], [place], [place] [address]    Culture FEW CANDIDA ALBICANS  Final   Report Status 11/09/2018 FINAL  Final    Best Practice/Protocols:  VTE Prophylaxis: Lovenox (prophylaxtic dose) GI Prophylaxis: Antihistamine Continous Sedation  Events:   Studies: Dg Abd 1 View  Result Date: 11/01/2018 CLINICAL DATA:  Evaluate NG tube. EXAM: ABDOMEN - 1 VIEW COMPARISON:  None FINDINGS: The NG tube terminates in the distal stomach. IMPRESSION: The NG tube terminates in the distal stomach. No other abnormalities. Electronically Signed   By: Dorise Bullion III M.D   On: 11/01/2018 19:23   Mr Thoracic Spine W Wo Contrast  Result Date: 11/06/2018 CLINICAL DATA:  Patient with history of multiple myeloma. Acute presentation with back pain. Persistent fevers. EXAM: MRI THORACIC WITHOUT AND WITH CONTRAST TECHNIQUE: Multiplanar and multiecho pulse sequences of the thoracic spine were obtained without and with intravenous contrast. CONTRAST:  9 cc Gadavist  COMPARISON:  CT chest done 5 days ago. FINDINGS: MRI THORACIC SPINE FINDINGS Alignment:  Normal Vertebrae: No fracture or focal bone lesion. No evidence of bone or disc space infection. Cord:  Normal Paraspinal and other soft tissues: Widespread bilateral pulmonary infiltrates have developed since the previous chest CT consistent with pneumonia.  There are layering pleural effusions right more than left. Disc levels: Ordinary mild bulging of T7-8 and T3-4 without evidence of compressive stenosis. No facet arthropathy. IMPRESSION: No likely significant spinal finding. Mild non-compressive disc bulges at T3-4 and T7-8. No evidence of spinal infection. Markedly worsened pulmonary infiltrates and bilateral pleural effusions when compared to the previous chest CT. Electronically Signed   By: Nelson Chimes M.D.   On: 11/06/2018 17:06   Mr Lumbar Spine W Wo Contrast  Result Date: 11/06/2018 CLINICAL DATA:  History of malignant melanoma. Acute presentation with back pain. Persistent fever. EXAM: MRI LUMBAR SPINE WITHOUT AND WITH CONTRAST TECHNIQUE: Multiplanar and multiecho pulse sequences of the lumbar spine were obtained without and with intravenous contrast. CONTRAST:  9 cc Gadavist COMPARISON:  CT abdomen 09/01/2017. FINDINGS: Segmentation:  5 lumbar type vertebral bodies. Alignment: Mild curvature convex to the left with the apex at L4. 3 mm degenerative anterolisthesis L5-S1. Vertebrae: Edema within the L3, L4 and L5 vertebral bodies most consistent with degenerative discogenic edema. Conus medullaris and cauda equina: Conus extends to the L1 level. Conus and cauda equina appear normal. Paraspinal and other soft tissues: Negative Disc levels: T12-L1: Shallow protrusion of the disc in the midline. This indents the thecal sac but does not appear to cause neural compression. L1-2 and L2-3: Normal. L3-4: Chronic disc degeneration with endplate osteophytes and shallow protrusion of the disc. Narrowing of the disc height. Facet and ligamentous hypertrophy. Moderate multifactorial stenosis that could be symptomatic. Findings appear similar to the CT scan of last year. L4-5: Chronic disc degeneration with endplate osteophytes and shallow protrusion of the disc. Narrowing of the disc space. Facet and ligamentous hypertrophy. Stenosis of the lateral recesses and  foramina right more than left. Neural compression could occur at this level. Similar appearance to the study of the previous CT. L5-S1: Advanced bilateral facet arthropathy with 3 mm of anterolisthesis. Mild bulging of the disc. Stenosis of the subarticular lateral recesses that could possibly cause neural compression. Similar appearance to the previous CT. There is no finding to strongly suggest spinal infection. The L3-4, L4-5 and L5-S1 findings are quite likely chronic and degenerative, particularly when viewed in combination with the CT study of January 2019. 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 Spinal stenosis that could cause neural compression. Discogenic vertebral body edema that could be associated with back pain. See above discussion. L5-S1 facet arthropathy with degenerative anterolisthesis of 3 mm. Stenosis of the subarticular lateral recesses. Findings at this level could also be symptomatic. Electronically Signed   By: Nelson Chimes M.D.   On: 11/06/2018 17:03   Dg Chest Port 1 View  Result Date: 11/09/2018 CLINICAL DATA:  Acute respiratory failure EXAM: PORTABLE CHEST 1 VIEW COMPARISON:  11/08/2018 FINDINGS: Endotracheal and NG tubes stable. Normal heart size. Bilateral patchy airspace opacities which are predominantly in the upper lobes have improved. No pneumothorax. No pleural effusion. IMPRESSION: Improving patchy bilateral airspace opacities. Electronically Signed   By: Marybelle Killings M.D.   On: 11/09/2018 07:35   Dg Chest Port 1 View  Result Date: 11/08/2018 CLINICAL DATA:  Respiratory distress. Intubated patient. Follow-up exam. EXAM: PORTABLE CHEST 1 VIEW COMPARISON:  Prior studies, most recent dated 11/06/2018. FINDINGS: There is been mild improvement in lung opacity since the prior study, with decreased opacity at the left lung base and less confluent opacity in the right upper lung. This apparent change may be due to larger lung volumes on the current exam.  There are no new lung abnormalities. No pneumothorax. Endotracheal tube and nasal/orogastric tube are stable and well positioned. IMPRESSION: 1. Mild improvement in lung aeration is suggested, although this apparent change may be due to larger lung volumes on the current exam compared to the most recent prior study. 2. No new abnormalities. 3. Stable well-positioned support apparatus. Electronically Signed   By: Lajean Manes M.D.   On: 11/08/2018 08:37   Dg Chest Port 1 View  Result Date: 11/06/2018 CLINICAL DATA:  Acute respiratory failure EXAM: PORTABLE CHEST 1 VIEW COMPARISON:  11/05/2018 FINDINGS: Endotracheal tube terminates 4.5 cm above the carina. Enteric tube courses into the stomach. Multifocal patchy opacities, right upper lobe predominant, compatible with multifocal pneumonia (reportedly on the basis of aspiration, mildly progressive. Possible small left pleural effusion. No pneumothorax. The heart is normal in size. IMPRESSION: Endotracheal tube terminates 4.5 cm above the carina. Multifocal pneumonia, right upper lobe predominant, mildly progressive. Possible small left pleural effusion. Electronically Signed   By: Julian Hy M.D.   On: 11/06/2018 03:56   Dg Chest Port 1 View  Result Date: 11/05/2018 CLINICAL DATA:  Acute respiratory failure EXAM: PORTABLE CHEST 1 VIEW COMPARISON:  Yesterday FINDINGS: Endotracheal tube tip at the clavicular heads. The orogastric tube tip reaches the stomach. Increased hazy opacification of the bilateral chest with pleural fluid seen at the bases. Normal heart size. No pneumothorax. IMPRESSION: 1. Stable hardware positioning. 2. History of large volume aspiration with increased chest opacification including pleural fluid. Electronically Signed   By: Monte Fantasia M.D.   On: 11/05/2018 04:47   Dg Chest Port 1 View  Result Date: 11/04/2018 CLINICAL DATA:  Rule out covid.  Intubated. EXAM: PORTABLE CHEST 1 VIEW COMPARISON:  11/04/2018 FINDINGS:  Endotracheal tube and NG tube are unchanged. Heart is borderline in size. Mild vascular congestion. Mild interstitial prominence within the lungs. No visible effusions or acute bony abnormality. IMPRESSION: Peribronchial thickening and interstitial prominence. This could reflect edema or atypical/viral infection. Electronically Signed   By: Rolm Baptise M.D.   On: 11/04/2018 10:32   Dg Chest Port 1 View  Result Date: 11/04/2018 CLINICAL DATA:  Acute respiratory failure EXAM: PORTABLE CHEST 1 VIEW COMPARISON:  11/02/2018 FINDINGS: Support devices are unchanged. Cardiomegaly. Bilateral perihilar and lower lobe airspace opacities have increased since prior study. Layering effusions bilaterally also increased. No acute bony abnormality. IMPRESSION: Worsening bilateral airspace disease and layering effusions. Electronically Signed   By: Rolm Baptise M.D.   On: 11/04/2018 07:16   Dg Chest Port 1 View  Result Date: 11/02/2018 CLINICAL DATA:  Shortness of breath with hypotension and hypoxia. EXAM: PORTABLE CHEST 1 VIEW COMPARISON:  11/01/2018 FINDINGS: Endotracheal tube and enteric tube unchanged. Lungs are adequately inflated and demonstrate hazy opacification over the right base slightly worse. Overall improvement in previously noted patchy bilateral density. No definite effusion. Remainder of the exam is unchanged. IMPRESSION: Interval improvement to near resolution in previously seen bilateral patchy density. Worsened mild hazy opacification over the right base which may be due to atelectasis or infection. Tubes and lines unchanged. Electronically Signed   By: Marin Olp M.D.   On: 11/02/2018 19:37   Dg Chest [facility]  Result Date: 11/01/2018 CLINICAL DATA:  Evaluate ETT EXAM: PORTABLE CHEST 1 VIEW COMPARISON:  November 01, 2018 FINDINGS: The ETT is in good position. The NG tube terminates below today's film. The cardiomediastinal silhouette is normal. Coarsened lung markings are identified, worse since  the previous chest x-ray. No focal infiltrate. IMPRESSION: 1. The ETT is in good position. The NG tube terminates below today's film. 2. Increasingly coarsened lung markings may represent bronchitic change or developing atypical infection. Electronically Signed   By: Dorise Bullion III M.D   On: 11/01/2018 19:23   Dg Chest Port 1 View  Result Date: 11/01/2018 CLINICAL DATA:  Cough. EXAM: PORTABLE CHEST 1 VIEW COMPARISON:  Radiographs of November 27, 2010. FINDINGS: The heart size and mediastinal contours are within normal limits. No pneumothorax or pleural effusion is noted. Right lung is clear. Minimal left basilar subsegmental atelectasis or infiltrate is noted. The visualized skeletal structures are unremarkable. IMPRESSION: Minimal left basilar subsegmental atelectasis or infiltrate. Electronically Signed   By: Marijo Conception, M.D.   On: 11/01/2018 10:35   Dg Abd Portable 1v  Result Date: 11/04/2018 CLINICAL DATA:  63 year old female currently intubated with abdominal distension EXAM: PORTABLE ABDOMEN - 1 VIEW COMPARISON:  Abdominal radiograph 11/01/2018 FINDINGS: A nasogastric tube is present. The tip of the tube overlies the gastric body. The visualized lung bases are clear. No evidence of large free air on this supine series. The colon is diffusely distended with gas but not dilated. No significant gaseous distension of small bowel. A right femoral central venous catheter is present. The tip overlies the right common iliac vein. A second catheter is present more laterally, likely representing a arterial catheter within the superficial femoral artery. Lower lumbar degenerative disc disease. No acute osseous abnormality. IMPRESSION: 1. Marked gaseous distension of the colon without dilation. Findings are most suggestive of colonic ileus. 2. No evidence of small bowel obstruction. 3. The tip of the gastric tube overlies the gastric body. 4. Right femoral arterial and venous lines. Electronically Signed    By: Jacqulynn Cadet M.D.   On: 11/04/2018 10:32   Ct Angio Chest/abd/pel For Dissection W And/or Wo Contrast  Result Date: 11/01/2018 CLINICAL DATA:  Back pain. EXAM: CT ANGIOGRAPHY CHEST, ABDOMEN AND PELVIS TECHNIQUE: Multidetector CT imaging through the chest, abdomen and pelvis was performed using the standard protocol during bolus administration of intravenous contrast. Multiplanar reconstructed images and MIPs were obtained and reviewed to evaluate the vascular anatomy. CONTRAST:  132m ISOVUE-370 IOPAMIDOL (ISOVUE-370) INJECTION 76% COMPARISON:  CT scan of September 01, 2017. FINDINGS: CTA CHEST FINDINGS Cardiovascular: Preferential opacification of the thoracic aorta. No evidence of thoracic aortic aneurysm or dissection. Normal heart size. No pericardial effusion. Mediastinum/Nodes: Small sliding-type hiatal hernia is noted. Thyroid gland is unremarkable. No significant adenopathy is noted. Lungs/Pleura: No pneumothorax or pleural effusion is noted. Mild bilateral posterior basilar subsegmental atelectasis is noted. Mild left lingular subsegmental atelectasis is noted. Musculoskeletal: No chest wall abnormality. No acute or significant osseous findings. Review of the MIP images confirms the above findings. CTA ABDOMEN AND PELVIS FINDINGS VASCULAR Aorta: Atherosclerosis of abdominal aorta is noted without aneurysm or dissection. Celiac: Patent without evidence of aneurysm, dissection, vasculitis or significant stenosis. SMA: Patent without evidence of aneurysm, dissection, vasculitis or significant stenosis. Renals: Both renal arteries are patent without evidence of aneurysm, dissection, vasculitis, fibromuscular dysplasia or significant stenosis. IMA: Patent without evidence of aneurysm, dissection, vasculitis or significant stenosis. Inflow: Patent without evidence of aneurysm, dissection, vasculitis or significant  stenosis. Veins: No obvious venous abnormality within the limitations of this arterial  phase study. Review of the MIP images confirms the above findings. NON-VASCULAR Hepatobiliary: No focal liver abnormality is seen. No gallstones, gallbladder wall thickening, or biliary dilatation. Pancreas: Unremarkable. No pancreatic ductal dilatation or surrounding inflammatory changes. Spleen: Normal in size without focal abnormality. Adrenals/Urinary Tract: Stable left adrenal adenoma. Right adrenal gland appears normal. Stable exophytic right renal cyst is noted. No hydronephrosis or renal obstruction is noted. Urinary bladder is unremarkable. Stomach/Bowel: Stomach is within normal limits. Appendix appears normal. No evidence of bowel wall thickening, distention, or inflammatory changes. Lymphatic: No significant adenopathy is noted. Reproductive: Status post hysterectomy. No adnexal masses. Other: No abdominal wall hernia or abnormality. No abdominopelvic ascites. Musculoskeletal: No acute or significant osseous findings. Review of the MIP images confirms the above findings. IMPRESSION: No definite evidence of thoracic or abdominal aortic dissection or aneurysm. No evidence of mesenteric or renal artery stenosis. Mild bibasilar subsegmental atelectasis is noted. Small sliding-type hiatal hernia. Stable left adrenal adenoma. Aortic Atherosclerosis (ICD10-I70.0). Electronically Signed   By: Marijo Conception, M.D.   On: 11/01/2018 11:53   [place] Ekg Site Rite  Result Date: 11/01/2018 If Site Rite image not attached, placement could not be confirmed due to current cardiac rhythm.   Consults: Treatment Team:  Pccm, Ander Gaster, MD   Subjective:    Overnight Issues:  She is currently on 4 sedative drips to include Precedex, propofol, Dilaudid and Versed infusions.  Exceedingly agitated when she is off of these.  Objective:  Vital signs for last 24 hours: Temp:  [97.9 F (36.6 C)-100.4 F (38 C)] 98.4 F (36.9 C) (04/06 2200) Pulse Rate:  [64-104] 70 (04/06 2200) Resp:  [12-22] 16 (04/06  2200) BP: (78-155)/(41-81) 107/59 (04/06 2200) SpO2:  [88 %-100 %] 93 % (04/06 2200) FiO2 (%):  [24 %-30 %] 24 % (04/06 2012) Weight:  [87.3 kg] 87.3 kg (04/06 0403)  Hemodynamic parameters for last 24 hours:    Intake/Output from previous day: 04/05 0701 - 04/06 0700 In: 4097.6 [I.V.:2182.4; IV Piggyback:1765.2] Out: 5675 [Urine:5275; Emesis/NG output:400]  Intake/Output this shift: Total I/O In: 312.6 [I.V.:312.6] Out: 450 [Urine:450]  Vent settings for last 24 hours: Vent Mode: PRVC FiO2 (%):  [24 %-30 %] 24 % Set Rate:  [18 bmp] 18 bmp Vt Set:  [450 mL] 450 mL PEEP:  [5 cmH20] 5 cmH20 Pressure Support:  [5 cmH20] 5 cmH20 Plateau Pressure:  [13 cmH20] 13 cmH20  Physical Exam:  GENERAL:  Intubated, sedated synchronous with the ventilator.   HEENT: Normocephalic, atraumatic.  Pupils equal, round, reactive to light.  No scleral icterus. Moist mucosal membranes.  Orotracheally intubated.  OG in place. NECK: Supple.  Trachea midline. No JVD.  PULMONARY: Coarse breath sounds, no wheezes. CARDIOVASCULAR: S1 and S2. Regular rate and rhythm. No murmurs, rubs, or gallops.  GASTROINTESTINAL: Soft, protuberant, normoactive bowel sounds. MUSCULOSKELETAL: No swelling, clubbing, or edema.  NEUROLOGIC: Periods of intermittent agitation, pulling at tubes and lines.  Moves all 4 spontaneously SKIN:intact,warm,dry  Assessment/Plan:  1.  Acute hypoxic respiratory failure: Acute lung injury resolved.  COVID-19 negative, COVID-19 RULED OUT.  No evidence of overt pneumonia on follow-up chest x-rays.  Agitated delirium has been limitation to extubation.  She is currently on too many IV infusions for sedation.  Will initiate enteral sedatives and wean off IVs as tolerated.  These infusions may actually be aggravating her delirium.  2.  Agitated delirium of uncertain etiology.  Obtain  CT head and able given the restrictions with COVID-19.  Initiate Klonopin and risperidone; wean off IV infusions of  sedatives as tolerated.  3.  Demand ischemia: Resolved with supportive care.  4.  Acute kidney injury now resolving.  Monitoring, urine output good.  Avoid nephrotoxins.  5.  Febrile illness of uncertain etiology.  CT head will help evaluate sinus disease.  Discussed with infectious disease.  6.  COPD: Continue nebulization treatments and pulmonary toilet.     LOS: 8 days   Additional comments:Multidisciplinary rounds were performed with the ICU team.  Case discussed with Dr. Delaine Lame, Infectious Disease.  Critical Care Total Time*: 40 Minutes  C.Derrill Kay, MD [facility] 11/09/2018  *Care during the described time interval was provided by me and/or other providers on the critical care team.  I have reviewed this patient's available data, including medical history, events of note, physical examination and test results as part of my evaluation.

## 2018-11-09 NOTE — Progress Notes (Addendum)
Pharmacy Electrolyte Monitoring Consult:  Pharmacy consulted to assist in monitoring and replacing electrolytes in this 63 y.o. female admitted on 11/01/2018 with Emesis   Labs:  Sodium (mmol/L)  Date Value  11/09/2018 135  03/16/2014 136   Potassium (mmol/L)  Date Value  11/09/2018 3.6  03/16/2014 3.8   Magnesium (mg/dL)  Date Value  11/09/2018 1.6 (L)   Phosphorus (mg/dL)  Date Value  11/09/2018 3.4   Calcium (mg/dL)  Date Value  11/09/2018 7.8 (L)   Calcium, Total (mg/dL)  Date Value  03/16/2014 9.0   Albumin (g/dL)  Date Value  11/06/2018 1.9 (L)  03/16/2014 3.8    Assessment/Plan: Patient ordered furosemide 40mg  IV Q12hr.   Magnesium 2g IV x 1 (total 4g on 4/5) and potassium 23mEq VT x 1.   Will replace for goal potassium ~ 4, goal magnesium ~ 2, and phosphate > 2.5.   Will obtain electrolytes with am labs.   Pharmacy will continue to monitor and adjust per consult.    Simpson,Michael L 11/09/2018 4:36 PM

## 2018-11-09 NOTE — Progress Notes (Signed)
Date of Admission:  11/01/2018      ID: Cindy Morrison is a 63 y.o. female  Active Problems:   Intractable nausea and vomiting   Septic shock (HCC)   Acute respiratory failure with hypoxia Cindy Morrison)  Cindy Morrison is a 63 y.o. with a history of choroidal malignant melanoma left eye , trochanteric bursitis rt hip, chronic low back pain was getting PT Was admitted on 3/29 with N/V  Spoke to her husband and according to him pt was doing well on Friday night. They had dinner and she went to bed/ On 3/28 ( Saturday) she was not feeling well and was lying in bed the whole time. She was c/o low back pain ( which she had for the past 6 months according to her husband) She vomited when she drank water. Her husband brought her in as she was feeling disoriented. In the ED on 3/29 initially she did not have any temperature BP was 105/67. She was worked up for any dissection of aorta and had CTA of the chest and abdomen and it was negative. She was intubated as she was hypoxic and hypercapnic. She was started on ceftriaxone, vanco, doxy. She was also tested for COVID.  Subjective: Intubated Sedated    Medications:  . budesonide (PULMICORT) nebulizer solution  0.5 mg Nebulization BID  . chlorhexidine gluconate (MEDLINE KIT)  15 mL Mouth Rinse BID  . Chlorhexidine Gluconate Cloth  6 each Topical Daily  . clonazePAM  1 mg Per Tube BID  . enoxaparin (LOVENOX) injection  40 mg Subcutaneous Q24H  . fentaNYL  1 patch Transdermal Q72H  . furosemide  40 mg Intravenous BID  . ipratropium-albuterol  3 mL Nebulization Q6H  . mouth rinse  15 mL Mouth Rinse 10 times per day  . nicotine  21 mg Transdermal Daily  . polyethylene glycol  17 g Per Tube Q24H  . risperiDONE  0.5 mg Per Tube BID  . senna-docusate  2 tablet Per Tube Q12H  . venlafaxine  75 mg Per NG tube BID WC    Objective: Vital signs in last 24 hours: Temp:  [98.2 F (36.8 C)-100.4 F (38 C)] 99 F (37.2 C) (04/06 1245) Pulse Rate:  [68-106]  80 (04/06 1245) Resp:  [13-22] 16 (04/06 1245) BP: (86-159)/(41-94) 138/65 (04/06 1245) SpO2:  [88 %-100 %] 96 % (04/06 1245) FiO2 (%):  [24 %-30 %] 30 % (04/06 1239) Weight:  [87.3 kg] 87.3 kg (04/06 0403)  PHYSICAL EXAM:  General: intubated Lungs:b/l air entry Abdomen: Soft,  Extremities: edma Skin: No rashes or lesions. Or bruising Lymph: Cervical, supraclavicular normal. Neurologic: cannot be examined Lab Results Recent Labs    11/07/18 0425  11/08/18 1230 11/08/18 1953 11/09/18 0500  WBC 10.3  --   --   --  8.4  HGB 9.7*  --   --   --  9.9*  HCT 29.8*  --   --   --  29.7*  NA 139   < > 135  --  135  K 3.3*   < > 2.8* 2.7* 3.6  CL 101   < > 96*  --  94*  CO2 30   < > 30  --  31  BUN 11   < > 7*  --  9  CREATININE 0.57   < > 0.48  --  0.49   < > = values in this interval not displayed.   Liver Panel No results for input(s): PROT,  ALBUMIN, AST, ALT, ALKPHOS, BILITOT, BILIDIR, IBILI in the last 72 hours. Sedimentation Rate No results for input(s): ESRSEDRATE in the last 72 hours. C-Reactive Protein No results for input(s): CRP in the last 72 hours.  Microbiology:  Studies/Results: Dg Chest Port 1 View  Result Date: 11/09/2018 CLINICAL DATA:  Acute respiratory failure EXAM: PORTABLE CHEST 1 VIEW COMPARISON:  11/08/2018 FINDINGS: Endotracheal and NG tubes stable. Normal heart size. Bilateral patchy airspace opacities which are predominantly in the upper lobes have improved. No pneumothorax. No pleural effusion. IMPRESSION: Improving patchy bilateral airspace opacities. Electronically Signed   By: Marybelle Killings M.D.   On: 11/09/2018 07:35   Dg Chest Port 1 View  Result Date: 11/08/2018 CLINICAL DATA:  Respiratory distress. Intubated patient. Follow-up exam. EXAM: PORTABLE CHEST 1 VIEW COMPARISON:  Prior studies, most recent dated 11/06/2018. FINDINGS: There is been mild improvement in lung opacity since the prior study, with decreased opacity at the left lung base and less  confluent opacity in the right upper lung. This apparent change may be due to larger lung volumes on the current exam. There are no new lung abnormalities. No pneumothorax. Endotracheal tube and nasal/orogastric tube are stable and well positioned. IMPRESSION: 1. Mild improvement in lung aeration is suggested, although this apparent change may be due to larger lung volumes on the current exam compared to the most recent prior study. 2. No new abnormalities. 3. Stable well-positioned support apparatus. Electronically Signed   By: Lajean Manes M.D.   On: 11/08/2018 08:37     Assessment/Plan: yr female presenting with nausea vomiting low back pain, some disorientation and found to be hypotensive, , hypoxic later and intubated, also has high procal , fever   Sepsis like picture- unclear etiology. Blood culture negative  CTA on admission- no pneumonia MRI no discitis Now has aspiration pneumonia- BAL culture no bacteria She is on levaquin, flagyl and vanco Will DC vanco   Agitation- Discussed with Dr.Gonzalez, CT head/sinus when she is stable Smoker, had hypoxia/hypercarbia and was intubated COVID-19 negative  ?Has malignant melanoma of the left eye - being observed H/o sciatica and trochanteric bursitis  Discussed the management with Intensivist

## 2018-11-09 NOTE — Progress Notes (Signed)
[facility] [place] NAME: Halia Odle    MR#:  [id number]  DATE OF BIRTH:  Oct 18, 1955  SUBJECTIVE:  CHIEF COMPLAINT:   Chief Complaint  Patient presents with  . Emesis   No new complaint reported .  Patient remains sedated on the vent.  Requiring four-point restraints for patient safety.  REVIEW OF SYSTEMS:  ROS Unobtainable due to patient being on the vent.  DRUG ALLERGIES:   Allergies  Allergen Reactions  . Erythromycin Base Swelling    Erythromycin ophthalmic ointment   VITALS:  Blood pressure (!) 148/79, pulse 87, temperature 98.2 F (36.8 C), resp. rate 19, height _0  (1.676 m), weight 87.3 kg, SpO2 98 %. PHYSICAL EXAMINATION:  Physical Exam  GENERAL:Patient sedated on the vent.  Requiring four-point restraints for patient safety  EYES: Pupils equal, round, reactive to light and accommodation. No scleral icterus. Extraocular muscles intact.  HEENT: Head atraumatic, normocephalic. Oropharynx and nasopharynx clear.ETT in place. NECK: Supple, no jugular venous distention. No thyroid enlargement, no tenderness.  LUNGS: +diffuse rhonchi, no wheezing, rales,or crepitation. No use of accessory muscles of respiration.  CARDIOVASCULAR: RRR,S1, S2 normal. No murmurs, rubs, or gallops.  ABDOMEN: Soft, nontender, nondistended. Bowel sounds present. No organomegaly or mass.  EXTREMITIES: No pedal edema, cyanosis, or clubbing.  NEUROLOGIC: unable to assess- intubated and sedated PSYCHIATRIC: unable to assess SKIN: No obvious rash, lesion, or ulcer.  LABORATORY PANEL:  Female CBC Recent Labs  Lab 11/09/18 0500  WBC 8.4  HGB 9.9*  HCT 29.7*  PLT 258   ------------------------------------------------------------------------------------------------------------------ Chemistries  Recent Labs  Lab 11/06/18 0357  11/09/18 0500  NA 140   < > 135  K 3.4*   < > 3.6  CL 102   < > 94*  CO2 31   < > 31  GLUCOSE 117*   < >  122*  BUN 12   < > 9  CREATININE 0.45   < > 0.49  CALCIUM 7.7*   < > 7.8*  MG 2.0   < > 1.6*  AST 31  --   --   ALT 42  --   --   ALKPHOS 41  --   --   BILITOT 0.7  --   --    < > = values in this interval not displayed.   RADIOLOGY:  Dg Chest Port 1 View  Result Date: 11/09/2018 CLINICAL DATA:  Acute respiratory failure EXAM: PORTABLE CHEST 1 VIEW COMPARISON:  11/08/2018 FINDINGS: Endotracheal and NG tubes stable. Normal heart size. Bilateral patchy airspace opacities which are predominantly in the upper lobes have improved. No pneumothorax. No pleural effusion. IMPRESSION: Improving patchy bilateral airspace opacities. Electronically Signed   By: Marybelle Killings M.D.   On: 11/09/2018 07:35   ASSESSMENT AND PLAN:   Acute hypoxic respiratory failure and moderate ARDS -vent management per CCM -COVID testing is negative -Patient status post flexible bronchoscopy done on 11/06/2018.  So far no growth on respiratory cultures  Sepsis- unclear etiology. No signs of UTI or pneumonia. -ID consulted- recommended MRI spine which was done on 11/06/2018 with no discitis.  -continue broad spectrum antibiotics per infectious disease specialist.  Appreciate input.  Patient currently on levofloxacin, Flagyl and vancomycin  Acute gastroenteritis- resolved. No additional episodes of vomiting.  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 Management plans discussed with the patient, family  and they are in agreement.  CODE STATUS: Full Code  TOTAL TIME TAKING CARE OF THIS PATIENT: 34 minutes.   More than 50% of the time was spent in counseling/coordination of care: YES  POSSIBLE D/C IN 3 DAYS, DEPENDING ON CLINICAL CONDITION.   Denisha Hoel M.D on 11/09/2018 at 12:09 PM  Between 7am to 6pm - Pager - [phone]  After 6pm go to [email or web address] - Proofreader  [facility]  Office  [phone]  CC: Primary care physician; Katheren Shams  Note: This dictation was prepared with Dragon dictation along with smaller phrase technology. Any transcriptional errors that result from this process are unintentional.

## 2018-11-09 NOTE — Progress Notes (Signed)
Nutrition Follow-up  RD working remotely.  DOCUMENTATION CODES:   Not applicable  INTERVENTION:  Initiate Vital High Protein at 20 mL/hr.  If patient tolerates, can advance tomorrow by 15 mL/hr every 8 hours to goal rate of 50 mL/hr (1200 mL goal daily volume). Provides 1200 kcal, 105 grams of protein, 1008 mL H2O daily. With current propofol rate provides 1908 kcal daily.  Provide liquid MVI daily per tube.  Recommend initiation of pro-kinetic agent and placement of post-pyloric feeding tube if patient still does not tolerate gastric feeds.  If these also do not work, will need to initiate TPN.  NUTRITION DIAGNOSIS:   Inadequate oral intake related to inability to eat as evidenced by NPO status.  Ongoing.  GOAL:   Provide needs based on ASPEN/SCCM guidelines  Progressing with initiation of trickle tube feeds.  MONITOR:   Vent status, Labs, Weight trends, TF tolerance, I & O's  REASON FOR ASSESSMENT:   Ventilator, Consult Enteral/tube feeding initiation and management  ASSESSMENT:   63 year old female with PMHx of GERD, psoriasis, anxiety, COPD admitted with severe acute hypoxic and hypercapnic respiratory failure from PNA and septic shock requiring intubation on 3/29, severe COPD exacerbation, undergoing r/o for COVID-19. Of note patient had intractable N/V prior to admission.   -Patient negative for COVID-19.  Patient remains intubated and sedated. On SBT with FiO2 30%, Pressure Support 5 cmH2O, and PEEP 5 cmH2O. Patient self-extubated on 4/1 and was re-intubated later. Also that day had large volume aspiration event before re-intubation. Tube feeds were never restarted. Patient is in 4-point restraints. Abdomen distended per RN documentation. Last BM was on 4/1.  Enteral Access: OGT placed 3/29; terminates in gastric body per abdominal x-ray 4/1; 68 cm at corner of mouth  MAP: 72-100 mmHg  Patient is currently intubated on ventilator support Ve: 10.6 L/min Temp  (24hrs), Avg:99 F (37.2 C), Min:98.2 F (36.8 C), Max:100.4 F (38 C)  Propofol: 26.8 mL/hr (708 kcal daily)  Medications reviewed and include: fentanyl patch, Lasix 40 mg BID IV, Reglan 5 mg Q6hrs IV, nicotine patch, Miralax 17 grams daily per tube, senna-docusate 2 tablets BID per tube, Precedex gtt, famotidine, fentanyl gtt, Levaquin, Flagyl, Versed gtt, propofol gtt, vancomycin.  Labs reviewed: CBG 106-117, Chloride 94, Magnesium 1.6, Triglycerides 390.  I/O: 5275 mL UOP Yesterday (2.5 mL/kg/hr)  Discussed with RN and MD. Plan is to resume trickle tube feeds today.  Diet Order:   Diet Order    None     EDUCATION NEEDS:   No education needs have been identified at this time  Skin:  Skin Assessment: Reviewed RN Assessment  Last BM:  11/04/2018 - small type 6  Height:   Ht Readings from Last 1 Encounters:  11/01/18 5\' 6"  (1.676 m)   Weight:   Wt Readings from Last 1 Encounters:  11/09/18 87.3 kg   Ideal Body Weight:  59.1 kg  BMI:  Body mass index is 31.06 kg/m.  Estimated Nutritional Needs:   Kcal:  1890 (PSU 2003b w/ MSJ 1361)  Protein:  95-120 grams (1.2-1.5 grams/kg)  Fluid:  2-2.3 L/day (25-30 mL/kg)  Willey Blade, MS, RD, LDN Office: [phone] Pager: [phone] After Hours/Weekend Pager: [phone]

## 2018-11-10 LAB — BASIC METABOLIC PANEL
Anion gap: 12 (ref 5–15)
BUN: 10 mg/dL (ref 8–23)
CO2: 30 mmol/L (ref 22–32)
Calcium: 7.5 mg/dL — ABNORMAL LOW (ref 8.9–10.3)
Chloride: 93 mmol/L — ABNORMAL LOW (ref 98–111)
Creatinine, Ser: 0.46 mg/dL (ref 0.44–1.00)
GFR calc Af Amer: >60 mL/min (ref >60)
GFR calc non Af Amer: >60 mL/min (ref >60)
Glucose, Bld: 132 mg/dL — ABNORMAL HIGH (ref 70–99)
Potassium: 3 mmol/L — ABNORMAL LOW (ref 3.5–5.1)
Sodium: 135 mmol/L (ref 135–145)

## 2018-11-10 LAB — CBC WITH DIFFERENTIAL/PLATELET
Abs Immature Granulocytes: 0.1 10*3/uL — ABNORMAL HIGH (ref 0.00–0.07)
Basophils Absolute: 0 10*3/uL (ref 0.0–0.1)
Basophils Relative: 0 %
Eosinophils Absolute: 0.1 10*3/uL (ref 0.0–0.5)
Eosinophils Relative: 1 %
HCT: 29.4 % — ABNORMAL LOW (ref 36.0–46.0)
Hemoglobin: 9.8 g/dL — ABNORMAL LOW (ref 12.0–15.0)
Immature Granulocytes: 1 %
Lymphocytes Relative: 14 %
Lymphs Abs: 1.3 10*3/uL (ref 0.7–4.0)
MCH: 31 pg (ref 26.0–34.0)
MCHC: 33.3 g/dL (ref 30.0–36.0)
MCV: 93 fL (ref 80.0–100.0)
Monocytes Absolute: 0.8 10*3/uL (ref 0.1–1.0)
Monocytes Relative: 9 %
Neutro Abs: 6.7 10*3/uL (ref 1.7–7.7)
Neutrophils Relative %: 75 %
Platelets: 242 10*3/uL (ref 150–400)
RBC: 3.16 MIL/uL — ABNORMAL LOW (ref 3.87–5.11)
RDW: 12.9 % (ref 11.5–15.5)
WBC: 9 10*3/uL (ref 4.0–10.5)
nRBC: 0 % (ref 0.0–0.2)

## 2018-11-10 LAB — FUNGITELL, SERUM: Fungitell Result: <31 pg/mL (ref <80)

## 2018-11-10 LAB — GLUCOSE, CAPILLARY
Glucose-Capillary: 103 mg/dL — ABNORMAL HIGH (ref 70–99)
Glucose-Capillary: 109 mg/dL — ABNORMAL HIGH (ref 70–99)
Glucose-Capillary: 114 mg/dL — ABNORMAL HIGH (ref 70–99)
Glucose-Capillary: 118 mg/dL — ABNORMAL HIGH (ref 70–99)
Glucose-Capillary: 127 mg/dL — ABNORMAL HIGH (ref 70–99)
Glucose-Capillary: 132 mg/dL — ABNORMAL HIGH (ref 70–99)

## 2018-11-10 LAB — PHOSPHORUS: Phosphorus: 3.9 mg/dL (ref 2.5–4.6)

## 2018-11-10 LAB — MAGNESIUM: Magnesium: 1.7 mg/dL (ref 1.7–2.4)

## 2018-11-10 MED ORDER — GABAPENTIN 250 MG/5ML PO SOLN
100.0000 mg | Freq: Three times a day (TID) | ORAL | Status: DC
  Administered 2018-11-10 – 2018-11-11 (×3): 100 mg
  Filled 2018-11-10 (×7): qty 2

## 2018-11-10 MED ORDER — METHYLNALTREXONE BROMIDE 12 MG/0.6ML ~~LOC~~ SOLN
12.0000 mg | Freq: Once | SUBCUTANEOUS | Status: AC
  Administered 2018-11-10: 22:00:00 12 mg via SUBCUTANEOUS
  Filled 2018-11-10: qty 0.6

## 2018-11-10 MED ORDER — POTASSIUM CHLORIDE 20 MEQ PO PACK
40.0000 meq | PACK | Freq: Once | ORAL | Status: AC
  Administered 2018-11-10: 40 meq

## 2018-11-10 MED ORDER — MAGNESIUM SULFATE 4 GM/100ML IV SOLN
4.0000 g | Freq: Once | INTRAVENOUS | Status: AC
  Administered 2018-11-10: 4 g via INTRAVENOUS
  Filled 2018-11-10: qty 100

## 2018-11-10 MED ORDER — FAMOTIDINE 20 MG PO TABS
20.0000 mg | ORAL_TABLET | Freq: Two times a day (BID) | ORAL | Status: DC
  Administered 2018-11-10 – 2018-11-11 (×2): 20 mg
  Filled 2018-11-10 (×2): qty 1

## 2018-11-10 MED ORDER — POTASSIUM CHLORIDE 20 MEQ PO PACK
40.0000 meq | PACK | Freq: Once | ORAL | Status: AC
  Administered 2018-11-10: 40 meq
  Filled 2018-11-10: qty 2

## 2018-11-10 NOTE — Progress Notes (Signed)
Pharmacy Electrolyte Monitoring Consult:  Pharmacy consulted to assist in monitoring and replacing electrolytes in this 63 y.o. female admitted on 11/01/2018 with Emesis   Labs:  Sodium (mmol/L)  Date Value  11/10/2018 135  03/16/2014 136   Potassium (mmol/L)  Date Value  11/10/2018 3.0 (L)  03/16/2014 3.8   Magnesium (mg/dL)  Date Value  11/10/2018 1.7   Phosphorus (mg/dL)  Date Value  11/10/2018 3.9   Calcium (mg/dL)  Date Value  11/10/2018 7.5 (L)   Calcium, Total (mg/dL)  Date Value  03/16/2014 9.0   Albumin (g/dL)  Date Value  11/06/2018 1.9 (L)  03/16/2014 3.8   Corrected Calcium: 9.2  Assessment/Plan: Patient ordered furosemide 40mg  IV Q12hr.   Potassium 80mEq VT x 2 doses. Magnesium 4g IV x 1.   Will replace for goal potassium ~ 4, goal magnesium ~ 2, and phosphate > 2.5.   Will obtain electrolytes with am labs.   Pharmacy will continue to monitor and adjust per consult.    Simpson,Michael L 11/10/2018 4:24 PM

## 2018-11-10 NOTE — Progress Notes (Signed)
[facility] NAME: Adalynd Frisby    MR#:  [id number]  DATE OF BIRTH:  [birth year]  SUBJECTIVE:  CHIEF COMPLAINT:   Chief Complaint  Patient presents with  . Emesis   No new complaint reported .  Patient remains sedated on the vent.  Requiring four-point restraints for patient safety.  REVIEW OF SYSTEMS:  ROS Unobtainable due to patient being on the vent.  DRUG ALLERGIES:   Allergies  Allergen Reactions  . Erythromycin Base Swelling    Erythromycin ophthalmic ointment   VITALS:  Blood pressure (!) 91/46, pulse 72, temperature 98.8 F (37.1 C), resp. rate 16, height _0  (1.676 m), weight 85.5 kg, SpO2 94 %. PHYSICAL EXAMINATION:  Physical Exam  GENERAL:Patient sedated on the vent.  Requiring four-point restraints for patient safety  EYES: Pupils equal, round, reactive to light and accommodation. No scleral icterus. Extraocular muscles intact.  HEENT: Head atraumatic, normocephalic. Oropharynx and nasopharynx clear.ETT in place. NECK: Supple, no jugular venous distention. No thyroid enlargement, no tenderness.  LUNGS: +diffuse rhonchi, no wheezing, rales,or crepitation. No use of accessory muscles of respiration.  CARDIOVASCULAR: RRR,S1, S2 normal. No murmurs, rubs, or gallops.  ABDOMEN: Soft, nontender, nondistended. Bowel sounds present. No organomegaly or mass.  EXTREMITIES: No pedal edema, cyanosis, or clubbing.  NEUROLOGIC: unable to assess- intubated and sedated PSYCHIATRIC: unable to assess SKIN: No obvious rash, lesion, or ulcer.  LABORATORY PANEL:  Female CBC Recent Labs  Lab 11/10/18 0552  WBC 9.0  HGB 9.8*  HCT 29.4*  PLT 242   ------------------------------------------------------------------------------------------------------------------ Chemistries  Recent Labs  Lab 11/06/18 0357  11/10/18 0552  NA 140   < > 135  K 3.4*   < > 3.0*  CL 102   < > 93*  CO2 31   < > 30  GLUCOSE 117*   < >  132*  BUN 12   < > 10  CREATININE 0.45   < > 0.46  CALCIUM 7.7*   < > 7.5*  MG 2.0   < > 1.7  AST 31  --   --   ALT 42  --   --   ALKPHOS 41  --   --   BILITOT 0.7  --   --    < > = values in this interval not displayed.   RADIOLOGY:  No results found. ASSESSMENT AND PLAN:   Acute hypoxic respiratory failure and moderate ARDS -vent management per CCM -COVID testing is negative -Patient status post flexible bronchoscopy done on 11/06/2018.  So far no growth on respiratory cultures  Sepsis- unclear etiology. No signs of UTI or pneumonia. -ID consulted- recommended MRI spine which was done on 11/06/2018 with no discitis.  -continue broad spectrum antibiotics per infectious disease specialist.  Appreciate input.   Patient was initially placed on broad-spectrum IV antibiotics with levofloxacin, Flagyl and vancomycin patient reevaluated by infectious disease specialist and vancomycin discontinued on 11/09/2018  Acute gastroenteritis- resolved. No additional episodes of vomiting.  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 Management plans discussed with the patient, family and they are in agreement.  CODE STATUS: Full Code  TOTAL TIME TAKING CARE OF THIS PATIENT: 35 minutes.   More than 50% of the time was spent in counseling/coordination of care: YES  POSSIBLE D/C IN 2-3 DAYS, DEPENDING ON CLINICAL CONDITION.   Merlin Golden M.D on  11/10/2018 at 2:51 PM  Between 7am to 6pm - Pager - [phone]  After 6pm go to [email or web address] - Proofreader  [facility]  Office  [phone]  CC: Primary care physician; Katheren Shams  Note: This dictation was prepared with Dragon dictation along with smaller phrase technology. Any transcriptional errors that result from this process are unintentional.

## 2018-11-10 NOTE — Progress Notes (Signed)
Accepted patient assignment from [facility] at 12:26

## 2018-11-10 NOTE — Progress Notes (Addendum)
Follow up - Critical Care Medicine Note  Patient Details:    Cindy Morrison is an 63 y.o. female. Admitted on 29 March with acute respiratory failure, required intubation.  She had protracted nausea and vomiting.  She has had severe issues with agitation and delirium which have limited weaning from the ventilator. Lines, Airways, Drains: Airway 7 mm (Active)  Secured at (cm) 23 cm 11/09/2018  8:12 PM  Measured From Lips 11/09/2018  8:12 PM  Secured Location Right 11/09/2018  8:12 PM  Secured By [employer] 11/09/2018  8:12 PM  Tube Holder Repositioned Yes 11/09/2018  8:12 PM  Cuff Pressure (cm H2O) 28 cm H2O 11/09/2018  8:12 PM  Site Condition Dry 11/09/2018  8:12 PM     CVC Triple Lumen 11/01/18 Right (Active)  Indication for Insertion or Continuance of Line Vasoactive infusions 11/09/2018 10:00 PM  Site Assessment Clean;Dry;Intact 11/09/2018 10:00 PM  Proximal Lumen Status Infusing 11/09/2018 10:00 PM  Medial Lumen Status Infusing 11/09/2018 10:00 PM  Distal Lumen Status Infusing 11/09/2018 10:00 PM  Dressing Type Transparent;Occlusive 11/09/2018 10:00 PM  Dressing Status Clean;Dry;Intact;Antimicrobial disc in place 11/09/2018 10:00 PM  Line Care Connections checked and tightened 11/09/2018 10:00 PM  Dressing Intervention Dressing changed 11/09/2018 10:00 PM  Dressing Change Due 11/16/18 11/09/2018 10:00 PM     NG/OG Tube Orogastric Center mouth Xray  (Active)  Cm Marking at Nare/Corner of Mouth (if applicable) 68 cm 09/05/2246  8:00 PM  Site Assessment Clean;Dry;Intact 11/09/2018  8:00 PM  Ongoing Placement Verification No change in cm markings or external length of tube from initial placement;No change in respiratory status;No acute changes, not attributed to clinical condition 11/09/2018  8:00 PM  Status Infusing tube feed 11/09/2018  8:00 PM  Amount of suction 80 mmHg 11/09/2018  4:00 PM  Drainage Appearance Bile;Brown 11/09/2018  4:00 PM  Intake (mL) 140 mL 11/05/2018  8:00 AM  Output (mL) 130 mL 11/09/2018  1:41  PM     Urethral Catheter Britt Boozer, RN Double-lumen;Latex 14 Fr. (Active)  Indication for Insertion or Continuance of Catheter Unstable critically ill patients first 24-48 hours (See Criteria) 11/09/2018  8:00 PM  Site Assessment Clean;Intact;Dry 11/09/2018  8:00 PM  Catheter Maintenance Bag below level of bladder;Catheter secured;Drainage bag/tubing not touching floor;Insertion date on drainage bag;No dependent loops;Seal intact 11/09/2018  8:00 PM  Collection Container Standard drainage bag 11/09/2018  8:00 PM  Securement Method Securing device (Describe) 11/09/2018  8:00 PM  Urinary Catheter Interventions Unclamped 11/09/2018  8:00 PM  Output (mL) 450 mL 11/09/2018  8:30 PM    Anti-infectives:  Anti-infectives (From admission, onward)   Start     Dose/Rate Route Frequency Ordered Stop   11/07/18 1800  levofloxacin (LEVAQUIN) IVPB 750 mg     750 mg 100 mL/hr over 90 Minutes Intravenous Every 24 hours 11/06/18 2027     11/06/18 1915  levofloxacin (LEVAQUIN) IVPB 750 mg  Status:  Discontinued     750 mg 100 mL/hr over 90 Minutes Intravenous Every 24 hours 11/06/18 1913 11/06/18 2027   11/04/18 2000  vancomycin (VANCOCIN) 1,000 mg in sodium chloride 0.9 % 250 mL IVPB  Status:  Discontinued     1,000 mg 250 mL/hr over 60 Minutes Intravenous Every 12 hours 11/04/18 0936 11/04/18 0941   11/04/18 2000  vancomycin (VANCOCIN) IVPB 1000 mg/200 mL premix  Status:  Discontinued     1,000 mg 200 mL/hr over 60 Minutes Intravenous Every 12 hours 11/04/18 0941 11/09/18 1543  11/04/18 2000  ceFEPIme (MAXIPIME) 2 g in sodium chloride 0.9 % 100 mL IVPB  Status:  Discontinued     2 g 200 mL/hr over 30 Minutes Intravenous Every 8 hours 11/04/18 1150 11/06/18 1913   11/04/18 0200  vancomycin (VANCOCIN) IVPB 750 mg/150 ml premix  Status:  Discontinued     750 mg 150 mL/hr over 60 Minutes Intravenous Every 8 hours 11/03/18 2136 11/04/18 0936   11/03/18 2200  piperacillin-tazobactam (ZOSYN) IVPB 3.375 g  Status:   Discontinued     3.375 g 12.5 mL/hr over 240 Minutes Intravenous Every 8 hours 11/03/18 1729 11/03/18 1748   11/03/18 2000  doxycycline (VIBRAMYCIN) 100 mg in sodium chloride 0.9 % 250 mL IVPB  Status:  Discontinued     100 mg 125 mL/hr over 120 Minutes Intravenous Every 12 hours 11/03/18 1719 11/03/18 1748   11/03/18 2000  ceFEPIme (MAXIPIME) 2 g in sodium chloride 0.9 % 100 mL IVPB  Status:  Discontinued     2 g 200 mL/hr over 30 Minutes Intravenous Every 12 hours 11/03/18 1748 11/04/18 1150   11/03/18 2000  metroNIDAZOLE (FLAGYL) IVPB 500 mg     500 mg 100 mL/hr over 60 Minutes Intravenous Every 8 hours 11/03/18 1748     11/03/18 1800  vancomycin (VANCOCIN) 1,500 mg in sodium chloride 0.9 % 500 mL IVPB     1,500 mg 250 mL/hr over 120 Minutes Intravenous  Once 11/03/18 1748 11/04/18 0700   11/03/18 0800  meropenem (MERREM) 1 g in sodium chloride 0.9 % 100 mL IVPB  Status:  Discontinued     1 g 200 mL/hr over 30 Minutes Intravenous Every 8 hours 11/03/18 0626 11/03/18 1729   11/02/18 2230  meropenem (MERREM) 1 g in sodium chloride 0.9 % 100 mL IVPB  Status:  Discontinued     1 g 200 mL/hr over 30 Minutes Intravenous Every 12 hours 11/02/18 2222 11/03/18 0626   11/02/18 2215  vancomycin (VANCOCIN) IVPB 750 mg/150 ml premix  Status:  Discontinued     750 mg 150 mL/hr over 60 Minutes Intravenous  Once 11/02/18 2211 11/02/18 2212   11/02/18 2211  vancomycin variable dose per unstable renal function (pharmacist dosing)  Status:  Discontinued      Does not apply See admin instructions 11/02/18 2211 11/02/18 2212   11/02/18 2000  cefTRIAXone (ROCEPHIN) 2 g in sodium chloride 0.9 % 100 mL IVPB  Status:  Discontinued     2 g 200 mL/hr over 30 Minutes Intravenous Every 24 hours 11/02/18 1111 11/02/18 2222   11/02/18 2000  doxycycline (VIBRAMYCIN) 200 mg in dextrose 5 % 250 mL IVPB  Status:  Discontinued     200 mg 125 mL/hr over 120 Minutes Intravenous Every 12 hours 11/02/18 1111 11/03/18 1719    11/01/18 1730  cefTRIAXone (ROCEPHIN) 2 g in sodium chloride 0.9 % 100 mL IVPB  Status:  Discontinued     2 g 200 mL/hr over 30 Minutes Intravenous Every 24 hours 11/01/18 1727 11/02/18 1111   11/01/18 1730  doxycycline (VIBRAMYCIN) 200 mg in dextrose 5 % 250 mL IVPB  Status:  Discontinued     200 mg 125 mL/hr over 120 Minutes Intravenous Every 12 hours 11/01/18 1727 11/02/18 1111      Microbiology: Results for orders placed or performed during the hospital encounter of 11/01/18  Novel Coronavirus, NAA (hospital order; send-out to ref lab)     Status: None   Collection Time: 11/01/18  3:08 PM  Result Value Ref Range Status   SARS-CoV-2, NAA NOT DETECTED NOT DETECTED Final    Comment: Negative (Not Detected) results do not exclude infection caused by SARS CoV 2 and should not be used as the sole basis for treatment or other patient management decisions. Optimum specimen types and timing for peak viral levels during infections caused  by SARS CoV 2 have not been determined. Collection of multiple specimens (types and time points) from the same patient may be necessary to detect the virus. Improper specimen collection and handling, sequence variability underlying assay primers and or probes, or the presence of organisms in  quantities less than the limit of detection of the assay may lead to false negative results. Positive and negative predictive values of testing are highly dependent on prevalence. False negative results are more likely when prevalence of disease is high. (NOTE) The expected result is Negative (Not Detected). The SARS CoV 2 test is intended for the presumptive qualitative  detection of nucleic acid from SARS CoV 2 in upper and lower  respir atory specimens. Testing methodology is real time RT PCR. Test results must be correlated with clinical presentation and  evaluated in the context of other laboratory and epidemiologic data.  Test performance can be affected because  the epidemiology and  clinical spectrum of infection caused by SARS CoV 2 is not fully  known. For example, the optimum types of specimens to collect and  when during the course of infection these specimens are most likely  to contain detectable viral RNA may not be known. This test has not been Food and Drug Administration (FDA) cleared or  approved and has been authorized by FDA under an Emergency Use  Authorization (EUA). The test is only authorized for the duration of  the declaration that circumstances exist justifying the authorization  of emergency use of in vitro diagnostic tests for detection and or  diagnosis of SARS CoV 2 under Section 564(b)(1) of the Act, 21 U.S.C.  section [phone] 3(b)(1), unless the authorization is terminated or   revoked sooner. [facility] is certified under the  Clinical Laboratory Improvement Amendments of 1988 (CLIA), 42 U.S.C.  section [phone], to perform high complexity tests. Performed at [facility] [id number], [place], [place] [address] Laboratory Director: Loleta Books, MD Performed at [facility], [place] [address], [place], [place] [address]    Coronavirus Source NASOPHARYNGEAL  Final    Comment: Performed at [facility], [address] [place], [place] [address]  Respiratory Panel by PCR     Status: None   Collection Time: 11/02/18 12:01 AM  Result Value Ref Range Status   Adenovirus NOT DETECTED NOT DETECTED Final   Coronavirus 229E NOT DETECTED NOT DETECTED Final    Comment: (NOTE) The Coronavirus on the Respiratory Panel, DOES NOT test for the novel  Coronavirus (2019 nCoV)    Coronavirus HKU1 NOT DETECTED NOT DETECTED Final   Coronavirus NL63 NOT DETECTED NOT DETECTED Final   Coronavirus OC43 NOT DETECTED NOT DETECTED Final   Metapneumovirus NOT DETECTED NOT DETECTED Final   Rhinovirus / Enterovirus NOT DETECTED NOT DETECTED Final    Influenza A NOT DETECTED NOT DETECTED Final   Influenza B NOT DETECTED NOT DETECTED Final   Parainfluenza Virus 1 NOT DETECTED NOT DETECTED Final   Parainfluenza Virus 2 NOT DETECTED NOT DETECTED Final   Parainfluenza Virus 3 NOT DETECTED NOT DETECTED Final  Parainfluenza Virus 4 NOT DETECTED NOT DETECTED Final   Respiratory Syncytial Virus NOT DETECTED NOT DETECTED Final   Bordetella pertussis NOT DETECTED NOT DETECTED Final   Chlamydophila pneumoniae NOT DETECTED NOT DETECTED Final   Mycoplasma pneumoniae NOT DETECTED NOT DETECTED Final    Comment: Performed at [facility], [place] [address], [place], [place] [address]  MRSA PCR Screening     Status: None   Collection Time: 11/02/18 12:01 AM  Result Value Ref Range Status   MRSA by PCR NEGATIVE NEGATIVE Final    Comment:        The GeneXpert MRSA Assay (FDA approved for NASAL specimens only), is one component of a comprehensive MRSA colonization surveillance program. It is not intended to diagnose MRSA infection nor to guide or monitor treatment for MRSA infections. Performed at [facility], [address] [place], [place] [address]   Culture, blood (Routine X 2) w Reflex to ID Panel     Status: None   Collection Time: 11/02/18  2:40 AM  Result Value Ref Range Status   Specimen Description BLOOD RIGHT WRIST  Final   Special Requests   Final    BOTTLES DRAWN AEROBIC AND ANAEROBIC Blood Culture adequate volume   Culture   Final    NO GROWTH 5 DAYS Performed at [facility], [address], [place], [place] [address]    Report Status 11/07/2018 FINAL  Final  Culture, blood (Routine X 2) w Reflex to ID Panel     Status: None   Collection Time: 11/02/18  2:47 AM  Result Value Ref Range Status   Specimen Description BLOOD RIGHT HAND  Final   Special Requests   Final    BOTTLES DRAWN AEROBIC AND ANAEROBIC Blood Culture adequate volume   Culture   Final    NO GROWTH 5 DAYS Performed at [facility], [address] [place], [place] [address]    Report Status 11/07/2018 FINAL  Final  Culture, respiratory (non-expectorated)     Status: None   Collection Time: 11/06/18  1:52 PM  Result Value Ref Range Status   Specimen Description   Final    BRONCHIAL ALVEOLAR LAVAGE Performed at [facility], [address], [place], [place] [address]    Special Requests   Final    NONE Performed at [facility], [address] [place], [place] [address]    Gram Stain   Final    ABUNDANT WBC PRESENT,BOTH PMN AND MONONUCLEAR NO ORGANISMS SEEN Performed at [facility], [place] [address], [place], [place] [address]    Culture FEW CANDIDA ALBICANS  Final   Report Status 11/09/2018 FINAL  Final    Best Practice/Protocols:  VTE Prophylaxis: Lovenox (prophylaxtic dose) GI Prophylaxis: Antihistamine Continous Sedation  Events:   Studies: Dg Abd 1 View  Result Date: 11/01/2018 CLINICAL DATA:  Evaluate NG tube. EXAM: ABDOMEN - 1 VIEW COMPARISON:  None FINDINGS: The NG tube terminates in the distal stomach. IMPRESSION: The NG tube terminates in the distal stomach. No other abnormalities. Electronically Signed   By: Dorise Bullion III M.D   On: 11/01/2018 19:23   Mr Thoracic Spine W Wo Contrast  Result Date: 11/06/2018 CLINICAL DATA:  Patient with history of multiple myeloma. Acute presentation with back pain. Persistent fevers. EXAM: MRI THORACIC WITHOUT AND WITH CONTRAST TECHNIQUE: Multiplanar and multiecho pulse sequences of the thoracic spine were obtained without and with intravenous contrast. CONTRAST:  9 cc Gadavist  COMPARISON:  CT chest done 5 days ago. FINDINGS: MRI THORACIC SPINE FINDINGS Alignment:  Normal Vertebrae: No fracture or focal bone lesion. No evidence of bone or disc space infection. Cord:  Normal Paraspinal and other soft tissues: Widespread bilateral pulmonary infiltrates have developed since the previous chest CT consistent with pneumonia.  There are layering pleural effusions right more than left. Disc levels: Ordinary mild bulging of T7-8 and T3-4 without evidence of compressive stenosis. No facet arthropathy. IMPRESSION: No likely significant spinal finding. Mild non-compressive disc bulges at T3-4 and T7-8. No evidence of spinal infection. Markedly worsened pulmonary infiltrates and bilateral pleural effusions when compared to the previous chest CT. Electronically Signed   By: Nelson Chimes M.D.   On: 11/06/2018 17:06   Mr Lumbar Spine W Wo Contrast  Result Date: 11/06/2018 CLINICAL DATA:  History of malignant melanoma. Acute presentation with back pain. Persistent fever. EXAM: MRI LUMBAR SPINE WITHOUT AND WITH CONTRAST TECHNIQUE: Multiplanar and multiecho pulse sequences of the lumbar spine were obtained without and with intravenous contrast. CONTRAST:  9 cc Gadavist COMPARISON:  CT abdomen 09/01/2017. FINDINGS: Segmentation:  5 lumbar type vertebral bodies. Alignment: Mild curvature convex to the left with the apex at L4. 3 mm degenerative anterolisthesis L5-S1. Vertebrae: Edema within the L3, L4 and L5 vertebral bodies most consistent with degenerative discogenic edema. Conus medullaris and cauda equina: Conus extends to the L1 level. Conus and cauda equina appear normal. Paraspinal and other soft tissues: Negative Disc levels: T12-L1: Shallow protrusion of the disc in the midline. This indents the thecal sac but does not appear to cause neural compression. L1-2 and L2-3: Normal. L3-4: Chronic disc degeneration with endplate osteophytes and shallow protrusion of the disc. Narrowing of the disc height. Facet and ligamentous hypertrophy. Moderate multifactorial stenosis that could be symptomatic. Findings appear similar to the CT scan of last year. L4-5: Chronic disc degeneration with endplate osteophytes and shallow protrusion of the disc. Narrowing of the disc space. Facet and ligamentous hypertrophy. Stenosis of the lateral recesses and  foramina right more than left. Neural compression could occur at this level. Similar appearance to the study of the previous CT. L5-S1: Advanced bilateral facet arthropathy with 3 mm of anterolisthesis. Mild bulging of the disc. Stenosis of the subarticular lateral recesses that could possibly cause neural compression. Similar appearance to the previous CT. There is no finding to strongly suggest spinal infection. The L3-4, L4-5 and L5-S1 findings are quite likely chronic and degenerative, particularly when viewed in combination with the CT study of January 2019. 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 Spinal stenosis that could cause neural compression. Discogenic vertebral body edema that could be associated with back pain. See above discussion. L5-S1 facet arthropathy with degenerative anterolisthesis of 3 mm. Stenosis of the subarticular lateral recesses. Findings at this level could also be symptomatic. Electronically Signed   By: Nelson Chimes M.D.   On: 11/06/2018 17:03   Dg Chest Port 1 View  Result Date: 11/09/2018 CLINICAL DATA:  Acute respiratory failure EXAM: PORTABLE CHEST 1 VIEW COMPARISON:  11/08/2018 FINDINGS: Endotracheal and NG tubes stable. Normal heart size. Bilateral patchy airspace opacities which are predominantly in the upper lobes have improved. No pneumothorax. No pleural effusion. IMPRESSION: Improving patchy bilateral airspace opacities. Electronically Signed   By: Marybelle Killings M.D.   On: 11/09/2018 07:35   Dg Chest Port 1 View  Result Date: 11/08/2018 CLINICAL DATA:  Respiratory distress. Intubated patient. Follow-up exam. EXAM: PORTABLE CHEST 1 VIEW COMPARISON:  Prior studies, most recent dated 11/06/2018. FINDINGS: There is been mild improvement in lung opacity since the prior study, with decreased opacity at the left lung base and less confluent opacity in the right upper lung. This apparent change may be due to larger lung volumes on the current exam.  There are no new lung abnormalities. No pneumothorax. Endotracheal tube and nasal/orogastric tube are stable and well positioned. IMPRESSION: 1. Mild improvement in lung aeration is suggested, although this apparent change may be due to larger lung volumes on the current exam compared to the most recent prior study. 2. No new abnormalities. 3. Stable well-positioned support apparatus. Electronically Signed   By: Lajean Manes M.D.   On: 11/08/2018 08:37   Dg Chest Port 1 View  Result Date: 11/06/2018 CLINICAL DATA:  Acute respiratory failure EXAM: PORTABLE CHEST 1 VIEW COMPARISON:  11/05/2018 FINDINGS: Endotracheal tube terminates 4.5 cm above the carina. Enteric tube courses into the stomach. Multifocal patchy opacities, right upper lobe predominant, compatible with multifocal pneumonia (reportedly on the basis of aspiration, mildly progressive. Possible small left pleural effusion. No pneumothorax. The heart is normal in size. IMPRESSION: Endotracheal tube terminates 4.5 cm above the carina. Multifocal pneumonia, right upper lobe predominant, mildly progressive. Possible small left pleural effusion. Electronically Signed   By: Julian Hy M.D.   On: 11/06/2018 03:56   Dg Chest Port 1 View  Result Date: 11/05/2018 CLINICAL DATA:  Acute respiratory failure EXAM: PORTABLE CHEST 1 VIEW COMPARISON:  Yesterday FINDINGS: Endotracheal tube tip at the clavicular heads. The orogastric tube tip reaches the stomach. Increased hazy opacification of the bilateral chest with pleural fluid seen at the bases. Normal heart size. No pneumothorax. IMPRESSION: 1. Stable hardware positioning. 2. History of large volume aspiration with increased chest opacification including pleural fluid. Electronically Signed   By: Monte Fantasia M.D.   On: 11/05/2018 04:47   Dg Chest Port 1 View  Result Date: 11/04/2018 CLINICAL DATA:  Rule out covid.  Intubated. EXAM: PORTABLE CHEST 1 VIEW COMPARISON:  11/04/2018 FINDINGS:  Endotracheal tube and NG tube are unchanged. Heart is borderline in size. Mild vascular congestion. Mild interstitial prominence within the lungs. No visible effusions or acute bony abnormality. IMPRESSION: Peribronchial thickening and interstitial prominence. This could reflect edema or atypical/viral infection. Electronically Signed   By: Rolm Baptise M.D.   On: 11/04/2018 10:32   Dg Chest Port 1 View  Result Date: 11/04/2018 CLINICAL DATA:  Acute respiratory failure EXAM: PORTABLE CHEST 1 VIEW COMPARISON:  11/02/2018 FINDINGS: Support devices are unchanged. Cardiomegaly. Bilateral perihilar and lower lobe airspace opacities have increased since prior study. Layering effusions bilaterally also increased. No acute bony abnormality. IMPRESSION: Worsening bilateral airspace disease and layering effusions. Electronically Signed   By: Rolm Baptise M.D.   On: 11/04/2018 07:16   Dg Chest Port 1 View  Result Date: 11/02/2018 CLINICAL DATA:  Shortness of breath with hypotension and hypoxia. EXAM: PORTABLE CHEST 1 VIEW COMPARISON:  11/01/2018 FINDINGS: Endotracheal tube and enteric tube unchanged. Lungs are adequately inflated and demonstrate hazy opacification over the right base slightly worse. Overall improvement in previously noted patchy bilateral density. No definite effusion. Remainder of the exam is unchanged. IMPRESSION: Interval improvement to near resolution in previously seen bilateral patchy density. Worsened mild hazy opacification over the right base which may be due to atelectasis or infection. Tubes and lines unchanged. Electronically Signed   By: Marin Olp M.D.   On: 11/02/2018 19:37   Dg Chest [facility]  Result Date: 11/01/2018 CLINICAL DATA:  Evaluate ETT EXAM: PORTABLE CHEST 1 VIEW COMPARISON:  November 01, 2018 FINDINGS: The ETT is in good position. The NG tube terminates below today's film. The cardiomediastinal silhouette is normal. Coarsened lung markings are identified, worse since  the previous chest x-ray. No focal infiltrate. IMPRESSION: 1. The ETT is in good position. The NG tube terminates below today's film. 2. Increasingly coarsened lung markings may represent bronchitic change or developing atypical infection. Electronically Signed   By: Dorise Bullion III M.D   On: 11/01/2018 19:23   Dg Chest Port 1 View  Result Date: 11/01/2018 CLINICAL DATA:  Cough. EXAM: PORTABLE CHEST 1 VIEW COMPARISON:  Radiographs of November 27, 2010. FINDINGS: The heart size and mediastinal contours are within normal limits. No pneumothorax or pleural effusion is noted. Right lung is clear. Minimal left basilar subsegmental atelectasis or infiltrate is noted. The visualized skeletal structures are unremarkable. IMPRESSION: Minimal left basilar subsegmental atelectasis or infiltrate. Electronically Signed   By: Marijo Conception, M.D.   On: 11/01/2018 10:35   Dg Abd Portable 1v  Result Date: 11/04/2018 CLINICAL DATA:  63 year old female currently intubated with abdominal distension EXAM: PORTABLE ABDOMEN - 1 VIEW COMPARISON:  Abdominal radiograph 11/01/2018 FINDINGS: A nasogastric tube is present. The tip of the tube overlies the gastric body. The visualized lung bases are clear. No evidence of large free air on this supine series. The colon is diffusely distended with gas but not dilated. No significant gaseous distension of small bowel. A right femoral central venous catheter is present. The tip overlies the right common iliac vein. A second catheter is present more laterally, likely representing a arterial catheter within the superficial femoral artery. Lower lumbar degenerative disc disease. No acute osseous abnormality. IMPRESSION: 1. Marked gaseous distension of the colon without dilation. Findings are most suggestive of colonic ileus. 2. No evidence of small bowel obstruction. 3. The tip of the gastric tube overlies the gastric body. 4. Right femoral arterial and venous lines. Electronically Signed    By: Jacqulynn Cadet M.D.   On: 11/04/2018 10:32   Ct Angio Chest/abd/pel For Dissection W And/or Wo Contrast  Result Date: 11/01/2018 CLINICAL DATA:  Back pain. EXAM: CT ANGIOGRAPHY CHEST, ABDOMEN AND PELVIS TECHNIQUE: Multidetector CT imaging through the chest, abdomen and pelvis was performed using the standard protocol during bolus administration of intravenous contrast. Multiplanar reconstructed images and MIPs were obtained and reviewed to evaluate the vascular anatomy. CONTRAST:  132m ISOVUE-370 IOPAMIDOL (ISOVUE-370) INJECTION 76% COMPARISON:  CT scan of September 01, 2017. FINDINGS: CTA CHEST FINDINGS Cardiovascular: Preferential opacification of the thoracic aorta. No evidence of thoracic aortic aneurysm or dissection. Normal heart size. No pericardial effusion. Mediastinum/Nodes: Small sliding-type hiatal hernia is noted. Thyroid gland is unremarkable. No significant adenopathy is noted. Lungs/Pleura: No pneumothorax or pleural effusion is noted. Mild bilateral posterior basilar subsegmental atelectasis is noted. Mild left lingular subsegmental atelectasis is noted. Musculoskeletal: No chest wall abnormality. No acute or significant osseous findings. Review of the MIP images confirms the above findings. CTA ABDOMEN AND PELVIS FINDINGS VASCULAR Aorta: Atherosclerosis of abdominal aorta is noted without aneurysm or dissection. Celiac: Patent without evidence of aneurysm, dissection, vasculitis or significant stenosis. SMA: Patent without evidence of aneurysm, dissection, vasculitis or significant stenosis. Renals: Both renal arteries are patent without evidence of aneurysm, dissection, vasculitis, fibromuscular dysplasia or significant stenosis. IMA: Patent without evidence of aneurysm, dissection, vasculitis or significant stenosis. Inflow: Patent without evidence of aneurysm, dissection, vasculitis or significant  stenosis. Veins: No obvious venous abnormality within the limitations of this arterial  phase study. Review of the MIP images confirms the above findings. NON-VASCULAR Hepatobiliary: No focal liver abnormality is seen. No gallstones, gallbladder wall thickening, or biliary dilatation. Pancreas: Unremarkable. No pancreatic ductal dilatation or surrounding inflammatory changes. Spleen: Normal in size without focal abnormality. Adrenals/Urinary Tract: Stable left adrenal adenoma. Right adrenal gland appears normal. Stable exophytic right renal cyst is noted. No hydronephrosis or renal obstruction is noted. Urinary bladder is unremarkable. Stomach/Bowel: Stomach is within normal limits. Appendix appears normal. No evidence of bowel wall thickening, distention, or inflammatory changes. Lymphatic: No significant adenopathy is noted. Reproductive: Status post hysterectomy. No adnexal masses. Other: No abdominal wall hernia or abnormality. No abdominopelvic ascites. Musculoskeletal: No acute or significant osseous findings. Review of the MIP images confirms the above findings. IMPRESSION: No definite evidence of thoracic or abdominal aortic dissection or aneurysm. No evidence of mesenteric or renal artery stenosis. Mild bibasilar subsegmental atelectasis is noted. Small sliding-type hiatal hernia. Stable left adrenal adenoma. Aortic Atherosclerosis (ICD10-I70.0). Electronically Signed   By: Marijo Conception, M.D.   On: 11/01/2018 11:53   [place] Ekg Site Rite  Result Date: 11/01/2018 If Site Rite image not attached, placement could not be confirmed due to current cardiac rhythm.   Consults: Treatment Team:  Pcc[facility], [facility], MD   Subjective:    Overnight Issues:   Objective:  Vital signs for last 24 hours: Temp:  [97.9 F (36.6 C)-100 F (37.8 C)] 99.9 F (37.7 C) (04/07 2200) Pulse Rate:  [63-122] 111 (04/07 2200) Resp:  [14-20] 18 (04/07 2200) BP: (85-156)/(46-107) 137/86 (04/07 2200) SpO2:  [89 %-98 %] 98 % (04/07 2200) FiO2 (%):  [24 %-30 %] 30 % (04/07 2000) Weight:  [85.5 kg] 85.5  kg (04/07 0500)  Hemodynamic parameters for last 24 hours:    Intake/Output from previous day: 04/06 0701 - 04/07 0700 In: 2449.3 [I.V.:2399.3; IV Piggyback:50] Out: 6962 [Urine:3690; Emesis/NG output:300]  Intake/Output this shift: Total I/O In: 877.2 [I.V.:307.6; NG/GT:569.7] Out: 1200 [Urine:1200]  Vent settings for last 24 hours: Vent Mode: PRVC FiO2 (%):  [24 %-30 %] 30 % Set Rate:  [18 bmp] 18 bmp Vt Set:  [450 mL] 450 mL PEEP:  [5 cmH20] 5 cmH20 Plateau Pressure:  [11 XBM84-13 cmH20] 20 cmH20  Physical Exam:  GENERAL:  Intubated, sedated synchronous with the ventilator if heavily sedated, asynchronous with agitation.   HEAD: Normocephalic, atraumatic.  EYES: Pupils equal, round, reactive to light.  No scleral icterus.  MOUTH: Moist mucosal membranes.  Orotracheally intubated.  OG in place. NECK: Supple.  Trachea midline. No JVD.  PULMONARY: Coarse breath sounds, no wheezes. CARDIOVASCULAR: S1 and S2. Regular rate and rhythm. No murmurs, rubs, or gallops.  GASTROINTESTINAL: Soft, protuberant, normoactive bowel sounds. MUSCULOSKELETAL: No swelling, clubbing, or edema.  NEUROLOGIC: Pulls at lines when agitated.  Moves all 4 spontaneously. SKIN:intact,warm,dry  Assessment/Plan:  1.  Acute hypoxic respiratory failure: Acute lung injury resolved.  COVID-19 negative, COVID-19 RULED OUT.  No evidence of overt pneumonia on follow-up chest x-rays.  Agitated delirium has been limitation to extubation.  Suspect that episode of aspiration led to aspiration pneumonitis and respiratory failure.  She continues to have issues with delirium which are precluding her weaning.  She may need tracheostomy to safely discontinue all IV sedatives and allow her better chance to wean.  She may need LTACH.  2.  Agitated delirium of uncertain etiology.  ICU delirium, questionable withdrawal.  Obtain CT  head in the morning.  Continue Klonopin and risperidone, doses adjusted wean off IV infusions of  sedatives as tolerated.  3.  Demand ischemia: Resolved with supportive care.  4.  Acute kidney injury now resolving. Urine output excellent, continue to monitor.  Avoid nephrotoxins.  5.  Febrile illness of uncertain etiology.  CT head will help evaluate sinus disease.  ID following, appreciate input.  6.  COPD: No acute exacerbation noted.  Continue bronchodilators and pulmonary toilet.       LOS: 9 days   Additional comments:Multidisciplinary rounds were performed with the ICU team.  Case discussed withpatients husband via telephone.   Critical Care Total Time*: 40 Minutes  C. Derrill Kay, MD [facility]  11/10/2018  *Care during the described time interval was provided by me and/or other providers on the critical care team.  I have reviewed this patient's available data, including medical history, events of note, physical examination and test results as part of my evaluation.

## 2018-11-11 ENCOUNTER — Inpatient Hospital Stay: Payer: Managed Care, Other (non HMO)

## 2018-11-11 DIAGNOSIS — L304 Erythema intertrigo: Secondary | ICD-10-CM

## 2018-11-11 DIAGNOSIS — R410 Disorientation, unspecified: Secondary | ICD-10-CM

## 2018-11-11 DIAGNOSIS — J9602 Acute respiratory failure with hypercapnia: Secondary | ICD-10-CM

## 2018-11-11 LAB — CBC
HCT: 32.7 % — ABNORMAL LOW (ref 36.0–46.0)
Hemoglobin: 10.8 g/dL — ABNORMAL LOW (ref 12.0–15.0)
MCH: 30.1 pg (ref 26.0–34.0)
MCHC: 33 g/dL (ref 30.0–36.0)
MCV: 91.1 fL (ref 80.0–100.0)
Platelets: 293 10*3/uL (ref 150–400)
RBC: 3.59 MIL/uL — ABNORMAL LOW (ref 3.87–5.11)
RDW: 12.9 % (ref 11.5–15.5)
WBC: 10.1 10*3/uL (ref 4.0–10.5)
nRBC: 0 % (ref 0.0–0.2)

## 2018-11-11 LAB — BASIC METABOLIC PANEL
Anion gap: 9 (ref 5–15)
Anion gap: 8 (ref 5–15)
BUN: 14 mg/dL (ref 8–23)
BUN: 13 mg/dL (ref 8–23)
CO2: 30 mmol/L (ref 22–32)
CO2: 30 mmol/L (ref 22–32)
Calcium: 8 mg/dL — ABNORMAL LOW (ref 8.9–10.3)
Calcium: 8.2 mg/dL — ABNORMAL LOW (ref 8.9–10.3)
Chloride: 98 mmol/L (ref 98–111)
Chloride: 100 mmol/L (ref 98–111)
Creatinine, Ser: 0.57 mg/dL (ref 0.44–1.00)
Creatinine, Ser: 0.43 mg/dL — ABNORMAL LOW (ref 0.44–1.00)
GFR calc Af Amer: >60 mL/min (ref >60)
GFR calc Af Amer: >60 mL/min (ref >60)
GFR calc non Af Amer: >60 mL/min (ref >60)
GFR calc non Af Amer: >60 mL/min (ref >60)
Glucose, Bld: 145 mg/dL — ABNORMAL HIGH (ref 70–99)
Glucose, Bld: 126 mg/dL — ABNORMAL HIGH (ref 70–99)
Potassium: 3 mmol/L — ABNORMAL LOW (ref 3.5–5.1)
Potassium: 2.9 mmol/L — ABNORMAL LOW (ref 3.5–5.1)
Sodium: 137 mmol/L (ref 135–145)
Sodium: 138 mmol/L (ref 135–145)

## 2018-11-11 LAB — GLUCOSE, CAPILLARY
Glucose-Capillary: 120 mg/dL — ABNORMAL HIGH (ref 70–99)
Glucose-Capillary: 122 mg/dL — ABNORMAL HIGH (ref 70–99)
Glucose-Capillary: 122 mg/dL — ABNORMAL HIGH (ref 70–99)
Glucose-Capillary: 123 mg/dL — ABNORMAL HIGH (ref 70–99)
Glucose-Capillary: 124 mg/dL — ABNORMAL HIGH (ref 70–99)
Glucose-Capillary: 126 mg/dL — ABNORMAL HIGH (ref 70–99)

## 2018-11-11 LAB — PHOSPHORUS: Phosphorus: 3.6 mg/dL (ref 2.5–4.6)

## 2018-11-11 LAB — MAGNESIUM
Magnesium: 2.1 mg/dL (ref 1.7–2.4)
Magnesium: 1.8 mg/dL (ref 1.7–2.4)

## 2018-11-11 LAB — TRIGLYCERIDES: Triglycerides: 494 mg/dL — ABNORMAL HIGH (ref <150)

## 2018-11-11 MED ORDER — GABAPENTIN 250 MG/5ML PO SOLN
200.0000 mg | Freq: Three times a day (TID) | ORAL | Status: DC
  Administered 2018-11-11 – 2018-11-13 (×5): 200 mg
  Filled 2018-11-11 (×12): qty 4

## 2018-11-11 MED ORDER — MIDAZOLAM HCL 2 MG/2ML IJ SOLN
2.0000 mg | INTRAMUSCULAR | Status: DC | PRN
  Administered 2018-11-11 – 2018-11-13 (×5): 4 mg via INTRAVENOUS
  Filled 2018-11-11 (×2): qty 4

## 2018-11-11 MED ORDER — MIDAZOLAM 50MG/50ML (1MG/ML) PREMIX INFUSION
0.5000 mg/h | INTRAVENOUS | Status: DC
  Administered 2018-11-11: 5 mg/h via INTRAVENOUS
  Filled 2018-11-11: qty 50

## 2018-11-11 MED ORDER — CLONAZEPAM 1 MG PO TABS
1.0000 mg | ORAL_TABLET | Freq: Three times a day (TID) | ORAL | Status: DC
  Administered 2018-11-11 – 2018-11-13 (×5): 1 mg
  Filled 2018-11-11 (×5): qty 1

## 2018-11-11 MED ORDER — MAGNESIUM SULFATE 2 GM/50ML IV SOLN
2.0000 g | Freq: Once | INTRAVENOUS | Status: AC
  Administered 2018-11-11: 2 g via INTRAVENOUS
  Filled 2018-11-11: qty 50

## 2018-11-11 MED ORDER — ADULT MULTIVITAMIN LIQUID CH
15.0000 mL | Freq: Every day | ORAL | Status: DC
  Administered 2018-11-11 – 2018-11-13 (×3): 15 mL
  Filled 2018-11-11 (×3): qty 15

## 2018-11-11 MED ORDER — VITAL HIGH PROTEIN PO LIQD
1000.0000 mL | ORAL | Status: DC
  Administered 2018-11-11 – 2018-11-12 (×3): 1000 mL

## 2018-11-11 MED ORDER — RISPERIDONE 1 MG PO TABS
1.0000 mg | ORAL_TABLET | Freq: Two times a day (BID) | ORAL | Status: DC
  Administered 2018-11-11 – 2018-11-13 (×4): 1 mg
  Filled 2018-11-11 (×5): qty 1

## 2018-11-11 MED ORDER — POTASSIUM CHLORIDE 20 MEQ PO PACK
40.0000 meq | PACK | ORAL | Status: AC
  Administered 2018-11-11 (×2): 40 meq
  Filled 2018-11-11 (×2): qty 2

## 2018-11-11 MED ORDER — POTASSIUM CHLORIDE 20 MEQ PO PACK
40.0000 meq | PACK | ORAL | Status: AC
  Administered 2018-11-11 (×2): 40 meq
  Filled 2018-11-11 (×2): qty 2

## 2018-11-11 MED ORDER — MIDAZOLAM HCL 2 MG/2ML IJ SOLN
2.0000 mg | INTRAMUSCULAR | Status: DC | PRN
  Administered 2018-11-11 (×2): 4 mg via INTRAVENOUS
  Filled 2018-11-11: qty 4

## 2018-11-11 MED ORDER — PANTOPRAZOLE SODIUM 40 MG PO PACK
40.0000 mg | PACK | Freq: Every day | ORAL | Status: DC
  Administered 2018-11-11 – 2018-11-13 (×3): 40 mg
  Filled 2018-11-11 (×3): qty 20

## 2018-11-11 NOTE — Progress Notes (Addendum)
Pharmacy Electrolyte Monitoring Consult:  Pharmacy consulted to assist in monitoring and replacing electrolytes in this 63 y.o. female admitted on 11/01/2018 with Emesis   Labs:  Sodium (mmol/L)  Date Value  11/11/2018 137  03/16/2014 136   Potassium (mmol/L)  Date Value  11/11/2018 3.0 (L)  03/16/2014 3.8   Magnesium (mg/dL)  Date Value  11/11/2018 2.1   Phosphorus (mg/dL)  Date Value  11/11/2018 3.6   Calcium (mg/dL)  Date Value  11/11/2018 8.0 (L)   Calcium, Total (mg/dL)  Date Value  03/16/2014 9.0   Albumin (g/dL)  Date Value  11/06/2018 1.9 (L)  03/16/2014 3.8   Corrected Calcium: 9.2  Assessment/Plan: Patient ordered furosemide 40mg  IV Q12hr. Patient with large bowel movement this morning.   Potassium 56mEq VT x 4 doses. **initially ordered x 2 doses. Potassium recheck is 2.9 - resulting in an additional 2 doses**  Will replace for goal potassium ~ 4, goal magnesium ~ 2, and phosphate > 2.5.   Will obtain electrolytes with am labs.   Pharmacy will continue to monitor and adjust per consult.    Simpson,Michael L 11/11/2018 1:32 PM

## 2018-11-11 NOTE — Progress Notes (Signed)
 Date of Admission:  11/01/2018     ID: Cindy Morrison is a 62 y.o. female  Active Problems:   Intractable nausea and vomiting   Septic shock (HCC)   Acute respiratory failure with hypoxia (HCC)    Subjective: Intubated and agitated  Medications:  . chlorhexidine gluconate (MEDLINE KIT)  15 mL Mouth Rinse BID  . Chlorhexidine Gluconate Cloth  6 each Topical Daily  . clonazePAM  1 mg Per Tube TID  . enoxaparin (LOVENOX) injection  40 mg Subcutaneous Q24H  . fentaNYL  1 patch Transdermal Q72H  . furosemide  40 mg Intravenous BID  . gabapentin  200 mg Per Tube Q8H  . ipratropium-albuterol  3 mL Nebulization Q6H  . mouth rinse  15 mL Mouth Rinse 10 times per day  . metoCLOPramide (REGLAN) injection  5 mg Intravenous Q6H  . multivitamin  15 mL Per Tube Daily  . pantoprazole sodium  40 mg Per Tube Daily  . potassium chloride  40 mEq Per Tube Q4H  . risperiDONE  1 mg Per Tube BID  . venlafaxine  75 mg Per NG tube BID WC    Objective: Vital signs in last 24 hours: Temp:  [96.1 F (35.6 C)-99.9 F (37.7 C)] 97.5 F (36.4 C) (04/08 1700) Pulse Rate:  [68-122] 71 (04/08 1700) Resp:  [14-22] 16 (04/08 1700) BP: (69-150)/(42-89) 107/54 (04/08 1700) SpO2:  [86 %-100 %] 96 % (04/08 1700) FiO2 (%):  [30 %-40 %] 40 % (04/08 1535) Weight:  [85.2 kg] 85.2 kg (04/08 0441)  PHYSICAL EXAM:  General: intubated, agitated Head: Normocephalic, without obvious abnormality, atraumatic. Eyes: Conjunctivae clear, anicteric sclerae. Pupils are equal Lungs: b/l air entry Heart: s1s2 Abdomen: Soft, Intertrigo inguinal area Extremities: atraumatic, no cyanosis. No edema. No clubbing Skin: No rashes or lesions. Or bruising Lymph: Cervical, supraclavicular normal. Neurologic: cannot be examined Lab Results   Recent Labs    11/10/18 0552 11/11/18 0502 11/11/18 1345  WBC 9.0 10.1  --   HGB 9.8* 10.8*  --   HCT 29.4* 32.7*  --   NA 135 137 138  K 3.0* 3.0* 2.9*  CL 93* 98 100  CO2 30  30 30  BUN 10 14 13  CREATININE 0.46 0.57 0.43*   Liver Panel No results for input(s): PROT, ALBUMIN, AST, ALT, ALKPHOS, BILITOT, BILIDIR, IBILI in the last 72 hours. Sedimentation Rate No results for input(s): ESRSEDRATE in the last 72 hours. C-Reactive Protein No results for input(s): CRP in the last 72 hours.  Microbiology:  Studies/Results: Ct Head Wo Contrast  Result Date: 11/11/2018 CLINICAL DATA:  Acute hypoxic respiratory failure. ARDS. Sepsis of unclear etiology. Altered mental status. EXAM: CT HEAD WITHOUT CONTRAST TECHNIQUE: Contiguous axial images were obtained from the base of the skull through the vertex without intravenous contrast. COMPARISON:  MRI, 11/21/2010.  Brain CT, 04/07/2008. FINDINGS: Brain: No evidence of acute infarction, hemorrhage, hydrocephalus, extra-axial collection or mass lesion/mass effect. Minor periventricular white matter hypoattenuation noted consistent with chronic microvascular ischemic change. Vascular: No hyperdense vessel or unexpected calcification. 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 No fracture or skull lesion. Sinuses/Orbits: Visualize globes and orbits are unremarkable. There is sinus disease. Dependent fluid noted in the visualized right maxillary sinus. Sphenoid sinuses are opacified. Mild ethmoid sinus mucosal thickening with a posterior ethmoid air cell opacified on the left. Dependent fluid in the left frontal sinus. Some fluid noted in mastoid air cells on the left. Other: None IMPRESSION: 1. No   acute intracranial abnormalities. 2. Minor chronic microvascular ischemic change. 3. Sinus disease as described including fluid levels. Consider acute sinusitis in the proper clinical setting. Electronically Signed   By: David  Ormond M.D.   On: 11/11/2018 12:31   Dg Chest Port 1 View  Result Date: 11/11/2018 CLINICAL DATA:  Acute respiratory failure EXAM: PORTABLE CHEST 1 VIEW COMPARISON:  Two days ago FINDINGS:  Endotracheal tube tip at the clavicular heads. The orogastric tube at least reaches the stomach. Improved aeration, especially on the left. Normal heart size. Aortic tortuosity. IMPRESSION: 1. Stable hardware positioning. 2. Improved aeration on the left. Electronically Signed   By: Jonathon  Watts M.D.   On: 11/11/2018 07:30     Assessment/Plan: 62 yr female presenting withnausea vomitinglow back pain, some disorientation and found to be hypotensive, , hypoxic later and intubated, also has high procal , fever   Sepsis like picture- unclear etiology.Blood culture negative  CTA on admission- no pneumonia MRI no discitis Treated for  aspiration pneumonia- BAL culture no bacteria CT sinus : right maxillary sinus. Sphenoid sinuses are opacified. Mild ethmoid sinus mucosal thickening with a posterior ethmoid air cell opacified on the left. Dependent fluid in the left frontal sinus. Some fluid noted in mastoid air cells on the left. 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   

## 2018-11-11 NOTE — Progress Notes (Signed)
Performed sedation vacation this am- patient tolerated lung volumes but was very agitated and would not follow commands- Dr. Patsey Berthold placed ENT consult for trach placement.  Patient very difficult to keep sedated- patient in 4 point restraints that she needs for safety.

## 2018-11-11 NOTE — Progress Notes (Signed)
[facility] [place] NAME: Cindy Morrison    MR#:  [id number]  DATE OF BIRTH:  05-26-1956  SUBJECTIVE:  CHIEF COMPLAINT:   Chief Complaint  Patient presents with   Emesis   No new complaint reported .  Patient remains sedated on the vent.  Requiring four-point restraints for patient safety.  REVIEW OF SYSTEMS:  ROS Unobtainable due to patient being on the vent.  DRUG ALLERGIES:   Allergies  Allergen Reactions   Erythromycin Base Swelling    Erythromycin ophthalmic ointment   VITALS:  Blood pressure (!) 93/58, pulse 68, temperature 98.2 F (36.8 C), resp. rate 19, height _0  (1.676 m), weight 85.2 kg, SpO2 100 %. PHYSICAL EXAMINATION:  Physical Exam  GENERAL:Patient sedated on the vent.  Requiring four-point restraints for patient safety  EYES: Pupils equal, round, reactive to light and accommodation. No scleral icterus. Extraocular muscles intact.  HEENT: Head atraumatic, normocephalic. Oropharynx and nasopharynx clear.ETT in place. NECK: Supple, no jugular venous distention. No thyroid enlargement, no tenderness.  LUNGS: +diffuse rhonchi, no wheezing, rales,or crepitation. No use of accessory muscles of respiration.  CARDIOVASCULAR: RRR,S1, S2 normal. No murmurs, rubs, or gallops.  ABDOMEN: Soft, nontender, nondistended. Bowel sounds present. No organomegaly or mass.  EXTREMITIES: No pedal edema, cyanosis, or clubbing.  NEUROLOGIC: unable to assess- intubated and sedated PSYCHIATRIC: unable to assess SKIN: No obvious rash, lesion, or ulcer.  LABORATORY PANEL:  Female CBC Recent Labs  Lab 11/11/18 0502  WBC 10.1  HGB 10.8*  HCT 32.7*  PLT 293   ------------------------------------------------------------------------------------------------------------------ Chemistries  Recent Labs  Lab 11/06/18 0357  11/11/18 0502 11/11/18 1345  NA 140   < > 137 138  K 3.4*   < > 3.0* 2.9*  CL 102   < > 98 100  CO2 31   <  > 30 30  GLUCOSE 117*   < > 145* 126*  BUN 12   < > 14 13  CREATININE 0.45   < > 0.57 0.43*  CALCIUM 7.7*   < > 8.0* 8.2*  MG 2.0   < > 2.1  --   AST 31  --   --   --   ALT 42  --   --   --   ALKPHOS 41  --   --   --   BILITOT 0.7  --   --   --    < > = values in this interval not displayed.   RADIOLOGY:  Ct Head Wo Contrast  Result Date: 11/11/2018 CLINICAL DATA:  Acute hypoxic respiratory failure. ARDS. Sepsis of unclear etiology. Altered mental status. EXAM: CT HEAD WITHOUT CONTRAST TECHNIQUE: Contiguous axial images were obtained from the base of the skull through the vertex without intravenous contrast. COMPARISON:  MRI, 11/21/2010.  Brain CT, 04/07/2008. FINDINGS: Brain: No evidence of acute infarction, hemorrhage, hydrocephalus, extra-axial collection or mass lesion/mass effect. Minor periventricular white matter hypoattenuation noted consistent with chronic microvascular ischemic change. Vascular: No hyperdense vessel or unexpected calcification. 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 No fracture or skull lesion. Sinuses/Orbits: Visualize globes and orbits are unremarkable. There is sinus disease. Dependent fluid noted in the visualized right maxillary sinus. Sphenoid sinuses are opacified. Mild ethmoid sinus mucosal thickening with a posterior ethmoid air cell opacified on the left. Dependent fluid in the left frontal sinus. Some fluid noted in mastoid air cells on the left. Other: None IMPRESSION: 1. No acute intracranial abnormalities.  2. Minor chronic microvascular ischemic change. 3. Sinus disease as described including fluid levels. Consider acute sinusitis in the proper clinical setting. Electronically Signed   By: Lajean Manes M.D.   On: 11/11/2018 12:31   Dg Chest Port 1 View  Result Date: 11/11/2018 CLINICAL DATA:  Acute respiratory failure EXAM: PORTABLE CHEST 1 VIEW COMPARISON:  Two days ago FINDINGS: Endotracheal tube tip at the clavicular heads. The  orogastric tube at least reaches the stomach. Improved aeration, especially on the left. Normal heart size. Aortic tortuosity. IMPRESSION: 1. Stable hardware positioning. 2. Improved aeration on the left. Electronically Signed   By: Monte Fantasia M.D.   On: 11/11/2018 07:30   ASSESSMENT AND PLAN:   Acute hypoxic respiratory failure and moderate ARDS -vent management per CCM -COVID testing is negative -Patient status post flexible bronchoscopy done on 11/06/2018.  So far no growth on respiratory cultures  Sepsis- unclear etiology. No signs of UTI or pneumonia. -ID consulted- recommended MRI spine which was done on 11/06/2018 with no discitis.  -continue broad spectrum antibiotics per infectious disease specialist.  Appreciate input.   Patient was initially placed on broad-spectrum IV antibiotics with levofloxacin, Flagyl and vancomycin patient reevaluated by infectious disease specialist and vancomycin discontinued on 11/09/2018  Acute gastroenteritis- resolved. No additional episodes of vomiting.  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 Management plans discussed with the patient, family and they are in agreement.  CODE STATUS: Full Code  TOTAL TIME TAKING CARE OF THIS PATIENT: 34 minutes.   More than 50% of the time was spent in counseling/coordination of care: YES  POSSIBLE D/C IN 2-3 DAYS, DEPENDING ON CLINICAL CONDITION.   Alyce Inscore M.D on 11/11/2018 at 2:33 PM  Between 7am to 6pm - Pager - [phone]  After 6pm go to [email or web address] - Proofreader  [facility]  Office  [phone]  CC: Primary care physician; Katheren Shams  Note: This dictation was prepared with Dragon dictation along with smaller phrase technology. Any transcriptional errors that result from this process are unintentional.

## 2018-11-11 NOTE — Progress Notes (Signed)
Follow up - Critical Care Medicine Note  Patient Details:    Cindy Morrison is an 63 y.o. female. Admitted on 29 March with acute respiratory failure, required intubation. She had protracted nausea and vomiting. She has had severe issues with agitation and delirium which have limited weaning from the ventilator. Lines, Airways, Drains: Airway 7 mm (Active)  Secured at (cm) 23 cm 11/09/2018  8:12 PM  Measured From Lips 11/09/2018  8:12 PM  Secured Location Right 11/09/2018  8:12 PM  Secured By [employer] 11/09/2018  8:12 PM  Tube Holder Repositioned Yes 11/09/2018  8:12 PM  Cuff Pressure (cm H2O) 28 cm H2O 11/09/2018  8:12 PM  Site Condition Dry 11/09/2018  8:12 PM     CVC Triple Lumen 11/01/18 Right (Active)  Indication for Insertion or Continuance of Line Vasoactive infusions 11/09/2018 10:00 PM  Site Assessment Clean;Dry;Intact 11/09/2018 10:00 PM  Proximal Lumen Status Infusing 11/09/2018 10:00 PM  Medial Lumen Status Infusing 11/09/2018 10:00 PM  Distal Lumen Status Infusing 11/09/2018 10:00 PM  Dressing Type Transparent;Occlusive 11/09/2018 10:00 PM  Dressing Status Clean;Dry;Intact;Antimicrobial disc in place 11/09/2018 10:00 PM  Line Care Connections checked and tightened 11/09/2018 10:00 PM  Dressing Intervention Dressing changed 11/09/2018 10:00 PM  Dressing Change Due 11/16/18 11/09/2018 10:00 PM     NG/OG Tube Orogastric Center mouth Xray  (Active)  Cm Marking at Nare/Corner of Mouth (if applicable) 68 cm 11/09/8293  8:00 PM  Site Assessment Clean;Dry;Intact 11/09/2018  8:00 PM  Ongoing Placement Verification No change in cm markings or external length of tube from initial placement;No change in respiratory status;No acute changes, not attributed to clinical condition 11/09/2018  8:00 PM  Status Infusing tube feed 11/09/2018  8:00 PM  Amount of suction 80 mmHg 11/09/2018  4:00 PM  Drainage Appearance Bile;Brown 11/09/2018  4:00 PM  Intake (mL) 140 mL 11/05/2018  8:00 AM  Output (mL) 130 mL 11/09/2018  1:41  PM     Urethral Catheter Britt Boozer, RN Double-lumen;Latex 14 Fr. (Active)  Indication for Insertion or Continuance of Catheter Unstable critically ill patients first 24-48 hours (See Criteria) 11/09/2018  8:00 PM  Site Assessment Clean;Intact;Dry 11/09/2018  8:00 PM  Catheter Maintenance Bag below level of bladder;Catheter secured;Drainage bag/tubing not touching floor;Insertion date on drainage bag;No dependent loops;Seal intact 11/09/2018  8:00 PM  Collection Container Standard drainage bag 11/09/2018  8:00 PM  Securement Method Securing device (Describe) 11/09/2018  8:00 PM  Urinary Catheter Interventions Unclamped 11/09/2018  8:00 PM  Output (mL) 450 mL 11/09/2018  8:30 PM    Anti-infectives:  Anti-infectives (From admission, onward)   Start     Dose/Rate Route Frequency Ordered Stop   11/07/18 1800  levofloxacin (LEVAQUIN) IVPB 750 mg     750 mg 100 mL/hr over 90 Minutes Intravenous Every 24 hours 11/06/18 2027     11/06/18 1915  levofloxacin (LEVAQUIN) IVPB 750 mg  Status:  Discontinued     750 mg 100 mL/hr over 90 Minutes Intravenous Every 24 hours 11/06/18 1913 11/06/18 2027   11/04/18 2000  vancomycin (VANCOCIN) 1,000 mg in sodium chloride 0.9 % 250 mL IVPB  Status:  Discontinued     1,000 mg 250 mL/hr over 60 Minutes Intravenous Every 12 hours 11/04/18 0936 11/04/18 0941   11/04/18 2000  vancomycin (VANCOCIN) IVPB 1000 mg/200 mL premix  Status:  Discontinued     1,000 mg 200 mL/hr over 60 Minutes Intravenous Every 12 hours 11/04/18 0941 11/09/18 1543   11/04/18  2000  ceFEPIme (MAXIPIME) 2 g in sodium chloride 0.9 % 100 mL IVPB  Status:  Discontinued     2 g 200 mL/hr over 30 Minutes Intravenous Every 8 hours 11/04/18 1150 11/06/18 1913   11/04/18 0200  vancomycin (VANCOCIN) IVPB 750 mg/150 ml premix  Status:  Discontinued     750 mg 150 mL/hr over 60 Minutes Intravenous Every 8 hours 11/03/18 2136 11/04/18 0936   11/03/18 2200  piperacillin-tazobactam (ZOSYN) IVPB 3.375 g  Status:   Discontinued     3.375 g 12.5 mL/hr over 240 Minutes Intravenous Every 8 hours 11/03/18 1729 11/03/18 1748   11/03/18 2000  doxycycline (VIBRAMYCIN) 100 mg in sodium chloride 0.9 % 250 mL IVPB  Status:  Discontinued     100 mg 125 mL/hr over 120 Minutes Intravenous Every 12 hours 11/03/18 1719 11/03/18 1748   11/03/18 2000  ceFEPIme (MAXIPIME) 2 g in sodium chloride 0.9 % 100 mL IVPB  Status:  Discontinued     2 g 200 mL/hr over 30 Minutes Intravenous Every 12 hours 11/03/18 1748 11/04/18 1150   11/03/18 2000  metroNIDAZOLE (FLAGYL) IVPB 500 mg  Status:  Discontinued     500 mg 100 mL/hr over 60 Minutes Intravenous Every 8 hours 11/03/18 1748 11/11/18 1454   11/03/18 1800  vancomycin (VANCOCIN) 1,500 mg in sodium chloride 0.9 % 500 mL IVPB     1,500 mg 250 mL/hr over 120 Minutes Intravenous  Once 11/03/18 1748 11/04/18 0700   11/03/18 0800  meropenem (MERREM) 1 g in sodium chloride 0.9 % 100 mL IVPB  Status:  Discontinued     1 g 200 mL/hr over 30 Minutes Intravenous Every 8 hours 11/03/18 0626 11/03/18 1729   11/02/18 2230  meropenem (MERREM) 1 g in sodium chloride 0.9 % 100 mL IVPB  Status:  Discontinued     1 g 200 mL/hr over 30 Minutes Intravenous Every 12 hours 11/02/18 2222 11/03/18 0626   11/02/18 2215  vancomycin (VANCOCIN) IVPB 750 mg/150 ml premix  Status:  Discontinued     750 mg 150 mL/hr over 60 Minutes Intravenous  Once 11/02/18 2211 11/02/18 2212   11/02/18 2211  vancomycin variable dose per unstable renal function (pharmacist dosing)  Status:  Discontinued      Does not apply See admin instructions 11/02/18 2211 11/02/18 2212   11/02/18 2000  cefTRIAXone (ROCEPHIN) 2 g in sodium chloride 0.9 % 100 mL IVPB  Status:  Discontinued     2 g 200 mL/hr over 30 Minutes Intravenous Every 24 hours 11/02/18 1111 11/02/18 2222   11/02/18 2000  doxycycline (VIBRAMYCIN) 200 mg in dextrose 5 % 250 mL IVPB  Status:  Discontinued     200 mg 125 mL/hr over 120 Minutes Intravenous Every  12 hours 11/02/18 1111 11/03/18 1719   11/01/18 1730  cefTRIAXone (ROCEPHIN) 2 g in sodium chloride 0.9 % 100 mL IVPB  Status:  Discontinued     2 g 200 mL/hr over 30 Minutes Intravenous Every 24 hours 11/01/18 1727 11/02/18 1111   11/01/18 1730  doxycycline (VIBRAMYCIN) 200 mg in dextrose 5 % 250 mL IVPB  Status:  Discontinued     200 mg 125 mL/hr over 120 Minutes Intravenous Every 12 hours 11/01/18 1727 11/02/18 1111      Microbiology: Results for orders placed or performed during the hospital encounter of 11/01/18  Novel Coronavirus, NAA (hospital order; send-out to ref lab)     Status: None   Collection Time: 11/01/18  3:08 PM  Result Value Ref Range Status   SARS-CoV-2, NAA NOT DETECTED NOT DETECTED Final    Comment: Negative (Not Detected) results do not exclude infection caused by SARS CoV 2 and should not be used as the sole basis for treatment or other patient management decisions. Optimum specimen types and timing for peak viral levels during infections caused  by SARS CoV 2 have not been determined. Collection of multiple specimens (types and time points) from the same patient may be necessary to detect the virus. Improper specimen collection and handling, sequence variability underlying assay primers and or probes, or the presence of organisms in  quantities less than the limit of detection of the assay may lead to false negative results. Positive and negative predictive values of testing are highly dependent on prevalence. False negative results are more likely when prevalence of disease is high. (NOTE) The expected result is Negative (Not Detected). The SARS CoV 2 test is intended for the presumptive qualitative  detection of nucleic acid from SARS CoV 2 in upper and lower  respir atory specimens. Testing methodology is real time RT PCR. Test results must be correlated with clinical presentation and  evaluated in the context of other laboratory and epidemiologic data.  Test  performance can be affected because the epidemiology and  clinical spectrum of infection caused by SARS CoV 2 is not fully  known. For example, the optimum types of specimens to collect and  when during the course of infection these specimens are most likely  to contain detectable viral RNA may not be known. This test has not been Food and Drug Administration (FDA) cleared or  approved and has been authorized by FDA under an Emergency Use  Authorization (EUA). The test is only authorized for the duration of  the declaration that circumstances exist justifying the authorization  of emergency use of in vitro diagnostic tests for detection and or  diagnosis of SARS CoV 2 under Section 564(b)(1) of the Act, 21 U.S.C.  section 7202987308 3(b)(1), unless the authorization is terminated or   revoked sooner. [facility] is certified under the  Clinical Laboratory Improvement Amendments of 1988 (CLIA), 42 U.S.C.  section [phone], to perform high complexity tests. Performed at [facility] [id number], [place], [place] [address] Laboratory Director: Loleta Books, MD Performed at [facility], [place] [address], [place], [place] [address]    Coronavirus Source NASOPHARYNGEAL  Final    Comment: Performed at [facility], [address] [place], [place] [address]  Respiratory Panel by PCR     Status: None   Collection Time: 11/02/18 12:01 AM  Result Value Ref Range Status   Adenovirus NOT DETECTED NOT DETECTED Final   Coronavirus 229E NOT DETECTED NOT DETECTED Final    Comment: (NOTE) The Coronavirus on the Respiratory Panel, DOES NOT test for the novel  Coronavirus (2019 nCoV)    Coronavirus HKU1 NOT DETECTED NOT DETECTED Final   Coronavirus NL63 NOT DETECTED NOT DETECTED Final   Coronavirus OC43 NOT DETECTED NOT DETECTED Final   Metapneumovirus NOT DETECTED NOT DETECTED Final   Rhinovirus / Enterovirus NOT  DETECTED NOT DETECTED Final   Influenza A NOT DETECTED NOT DETECTED Final   Influenza B NOT DETECTED NOT DETECTED Final   Parainfluenza Virus 1 NOT DETECTED NOT DETECTED Final   Parainfluenza Virus 2 NOT DETECTED NOT DETECTED Final   Parainfluenza Virus 3 NOT DETECTED NOT DETECTED  Final   Parainfluenza Virus 4 NOT DETECTED NOT DETECTED Final   Respiratory Syncytial Virus NOT DETECTED NOT DETECTED Final   Bordetella pertussis NOT DETECTED NOT DETECTED Final   Chlamydophila pneumoniae NOT DETECTED NOT DETECTED Final   Mycoplasma pneumoniae NOT DETECTED NOT DETECTED Final    Comment: Performed at [facility], [place] [address], [place], [place] [address]  MRSA PCR Screening     Status: None   Collection Time: 11/02/18 12:01 AM  Result Value Ref Range Status   MRSA by PCR NEGATIVE NEGATIVE Final    Comment:        The GeneXpert MRSA Assay (FDA approved for NASAL specimens only), is one component of a comprehensive MRSA colonization surveillance program. It is not intended to diagnose MRSA infection nor to guide or monitor treatment for MRSA infections. Performed at [facility], [address] [place], [place] [address]   Culture, blood (Routine X 2) w Reflex to ID Panel     Status: None   Collection Time: 11/02/18  2:40 AM  Result Value Ref Range Status   Specimen Description BLOOD RIGHT WRIST  Final   Special Requests   Final    BOTTLES DRAWN AEROBIC AND ANAEROBIC Blood Culture adequate volume   Culture   Final    NO GROWTH 5 DAYS Performed at [facility], [address] [place], [place] [address]    Report Status 11/07/2018 FINAL  Final  Culture, blood (Routine X 2) w Reflex to ID Panel     Status: None   Collection Time: 11/02/18  2:47 AM  Result Value Ref Range Status   Specimen Description BLOOD RIGHT HAND  Final   Special Requests   Final    BOTTLES DRAWN AEROBIC AND ANAEROBIC Blood Culture adequate volume   Culture   Final    NO GROWTH 5  DAYS Performed at [facility], [address], [place], [place] [address]    Report Status 11/07/2018 FINAL  Final  Culture, respiratory (non-expectorated)     Status: None   Collection Time: 11/06/18  1:52 PM  Result Value Ref Range Status   Specimen Description   Final    BRONCHIAL ALVEOLAR LAVAGE Performed at [facility], [address] [place], [place] [address]    Special Requests   Final    NONE Performed at [facility], [address] [place], [place] [address]    Gram Stain   Final    ABUNDANT WBC PRESENT,BOTH PMN AND MONONUCLEAR NO ORGANISMS SEEN Performed at [facility], [place] [address], [place], [place] [address]    Culture FEW CANDIDA ALBICANS  Final   Report Status 11/09/2018 FINAL  Final    Best Practice/Protocols:  VTE Prophylaxis: Lovenox (prophylaxtic dose) GI Prophylaxis: Antihistamine Continous Sedation  Events:   Studies: Dg Abd 1 View  Result Date: 11/01/2018 CLINICAL DATA:  Evaluate NG tube. EXAM: ABDOMEN - 1 VIEW COMPARISON:  None FINDINGS: The NG tube terminates in the distal stomach. IMPRESSION: The NG tube terminates in the distal stomach. No other abnormalities. Electronically Signed   By: Dorise Bullion III M.D   On: 11/01/2018 19:23   Ct Head Wo Contrast  Result Date: 11/11/2018 CLINICAL DATA:  Acute hypoxic respiratory failure. ARDS. Sepsis of unclear etiology. Altered mental status. EXAM: CT HEAD WITHOUT CONTRAST TECHNIQUE: Contiguous axial images were obtained from the base of the skull through the vertex without intravenous contrast. COMPARISON:  MRI, 11/21/2010.  Brain  CT, 04/07/2008. FINDINGS: Brain: No evidence of acute infarction, hemorrhage, hydrocephalus, extra-axial collection or mass lesion/mass effect. Minor periventricular white matter hypoattenuation noted consistent with chronic microvascular ischemic change. Vascular: No hyperdense vessel or unexpected calcification. 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 No fracture or skull lesion. Sinuses/Orbits: Visualize globes and orbits are unremarkable. There is sinus disease. Dependent fluid noted in the visualized right maxillary sinus. Sphenoid sinuses are opacified. Mild ethmoid sinus mucosal thickening with a posterior ethmoid air cell opacified on the left. Dependent fluid in the left frontal sinus. Some fluid noted in mastoid air cells on the left. Other: None IMPRESSION: 1. No acute intracranial abnormalities. 2. Minor chronic microvascular ischemic change. 3. Sinus disease as described including fluid levels. Consider acute sinusitis in the proper clinical setting. Electronically Signed   By: Lajean Manes M.D.   On: 11/11/2018 12:31   Mr Thoracic Spine W Wo Contrast  Result Date: 11/06/2018 CLINICAL DATA:  Patient with history of multiple myeloma. Acute presentation with back pain. Persistent fevers. EXAM: MRI THORACIC WITHOUT AND WITH CONTRAST TECHNIQUE: Multiplanar and multiecho pulse sequences of the thoracic spine were obtained without and with intravenous contrast. CONTRAST:  9 cc Gadavist COMPARISON:  CT chest done 5 days ago. FINDINGS: MRI THORACIC SPINE FINDINGS Alignment:  Normal Vertebrae: No fracture or focal bone lesion. No evidence of bone or disc space infection. Cord:  Normal Paraspinal and other soft tissues: Widespread bilateral pulmonary infiltrates have developed since the previous chest CT consistent with pneumonia. There are layering pleural effusions right more than left. Disc levels: Ordinary mild bulging of T7-8 and T3-4 without evidence of compressive stenosis. No facet arthropathy. IMPRESSION: No likely significant spinal finding. Mild non-compressive disc bulges at T3-4 and T7-8. No evidence of spinal infection. Markedly worsened pulmonary infiltrates and bilateral pleural effusions when compared to the previous chest CT. Electronically Signed   By: Nelson Chimes M.D.   On: 11/06/2018 17:06    Mr Lumbar Spine W Wo Contrast  Result Date: 11/06/2018 CLINICAL DATA:  History of malignant melanoma. Acute presentation with back pain. Persistent fever. EXAM: MRI LUMBAR SPINE WITHOUT AND WITH CONTRAST TECHNIQUE: Multiplanar and multiecho pulse sequences of the lumbar spine were obtained without and with intravenous contrast. CONTRAST:  9 cc Gadavist COMPARISON:  CT abdomen 09/01/2017. FINDINGS: Segmentation:  5 lumbar type vertebral bodies. Alignment: Mild curvature convex to the left with the apex at L4. 3 mm degenerative anterolisthesis L5-S1. Vertebrae: Edema within the L3, L4 and L5 vertebral bodies most consistent with degenerative discogenic edema. Conus medullaris and cauda equina: Conus extends to the L1 level. Conus and cauda equina appear normal. Paraspinal and other soft tissues: Negative Disc levels: T12-L1: Shallow protrusion of the disc in the midline. This indents the thecal sac but does not appear to cause neural compression. L1-2 and L2-3: Normal. L3-4: Chronic disc degeneration with endplate osteophytes and shallow protrusion of the disc. Narrowing of the disc height. Facet and ligamentous hypertrophy. Moderate multifactorial stenosis that could be symptomatic. Findings appear similar to the CT scan of last year. L4-5: Chronic disc degeneration with endplate osteophytes and shallow protrusion of the disc. Narrowing of the disc space. Facet and ligamentous hypertrophy. Stenosis of the lateral recesses and foramina right more than left. Neural compression could occur at this level. Similar appearance to the study of the previous CT. L5-S1: Advanced bilateral facet arthropathy with 3 mm of anterolisthesis. Mild bulging of the disc. Stenosis of the subarticular lateral recesses that could  possibly cause neural compression. Similar appearance to the previous CT. There is no finding to strongly suggest spinal infection. The L3-4, L4-5 and L5-S1 findings are quite likely chronic and degenerative,  particularly when viewed in combination with the CT study of January 2019. 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 Spinal stenosis that could cause neural compression. Discogenic vertebral body edema that could be associated with back pain. See above discussion. L5-S1 facet arthropathy with degenerative anterolisthesis of 3 mm. Stenosis of the subarticular lateral recesses. Findings at this level could also be symptomatic. Electronically Signed   By: Nelson Chimes M.D.   On: 11/06/2018 17:03   Dg Chest Port 1 View  Result Date: 11/11/2018 CLINICAL DATA:  Acute respiratory failure EXAM: PORTABLE CHEST 1 VIEW COMPARISON:  Two days ago FINDINGS: Endotracheal tube tip at the clavicular heads. The orogastric tube at least reaches the stomach. Improved aeration, especially on the left. Normal heart size. Aortic tortuosity. IMPRESSION: 1. Stable hardware positioning. 2. Improved aeration on the left. Electronically Signed   By: Monte Fantasia M.D.   On: 11/11/2018 07:30   Dg Chest Port 1 View  Result Date: 11/09/2018 CLINICAL DATA:  Acute respiratory failure EXAM: PORTABLE CHEST 1 VIEW COMPARISON:  11/08/2018 FINDINGS: Endotracheal and NG tubes stable. Normal heart size. Bilateral patchy airspace opacities which are predominantly in the upper lobes have improved. No pneumothorax. No pleural effusion. IMPRESSION: Improving patchy bilateral airspace opacities. Electronically Signed   By: Marybelle Killings M.D.   On: 11/09/2018 07:35   Dg Chest Port 1 View  Result Date: 11/08/2018 CLINICAL DATA:  Respiratory distress. Intubated patient. Follow-up exam. EXAM: PORTABLE CHEST 1 VIEW COMPARISON:  Prior studies, most recent dated 11/06/2018. FINDINGS: There is been mild improvement in lung opacity since the prior study, with decreased opacity at the left lung base and less confluent opacity in the right upper lung. This apparent change may be due to larger lung volumes on the current exam. There are no  new lung abnormalities. No pneumothorax. Endotracheal tube and nasal/orogastric tube are stable and well positioned. IMPRESSION: 1. Mild improvement in lung aeration is suggested, although this apparent change may be due to larger lung volumes on the current exam compared to the most recent prior study. 2. No new abnormalities. 3. Stable well-positioned support apparatus. Electronically Signed   By: Lajean Manes M.D.   On: 11/08/2018 08:37   Dg Chest Port 1 View  Result Date: 11/06/2018 CLINICAL DATA:  Acute respiratory failure EXAM: PORTABLE CHEST 1 VIEW COMPARISON:  11/05/2018 FINDINGS: Endotracheal tube terminates 4.5 cm above the carina. Enteric tube courses into the stomach. Multifocal patchy opacities, right upper lobe predominant, compatible with multifocal pneumonia (reportedly on the basis of aspiration, mildly progressive. Possible small left pleural effusion. No pneumothorax. The heart is normal in size. IMPRESSION: Endotracheal tube terminates 4.5 cm above the carina. Multifocal pneumonia, right upper lobe predominant, mildly progressive. Possible small left pleural effusion. Electronically Signed   By: Julian Hy M.D.   On: 11/06/2018 03:56   Dg Chest Port 1 View  Result Date: 11/05/2018 CLINICAL DATA:  Acute respiratory failure EXAM: PORTABLE CHEST 1 VIEW COMPARISON:  Yesterday FINDINGS: Endotracheal tube tip at the clavicular heads. The orogastric tube tip reaches the stomach. Increased hazy opacification of the bilateral chest with pleural fluid seen at the bases. Normal heart size. No pneumothorax. IMPRESSION: 1. Stable hardware positioning. 2. History of large volume aspiration with increased chest opacification including pleural fluid. Electronically Signed  By: Monte Fantasia M.D.   On: 11/05/2018 04:47   Dg Chest Port 1 View  Result Date: 11/04/2018 CLINICAL DATA:  Rule out covid.  Intubated. EXAM: PORTABLE CHEST 1 VIEW COMPARISON:  11/04/2018 FINDINGS: Endotracheal tube and  NG tube are unchanged. Heart is borderline in size. Mild vascular congestion. Mild interstitial prominence within the lungs. No visible effusions or acute bony abnormality. IMPRESSION: Peribronchial thickening and interstitial prominence. This could reflect edema or atypical/viral infection. Electronically Signed   By: Rolm Baptise M.D.   On: 11/04/2018 10:32   Dg Chest Port 1 View  Result Date: 11/04/2018 CLINICAL DATA:  Acute respiratory failure EXAM: PORTABLE CHEST 1 VIEW COMPARISON:  11/02/2018 FINDINGS: Support devices are unchanged. Cardiomegaly. Bilateral perihilar and lower lobe airspace opacities have increased since prior study. Layering effusions bilaterally also increased. No acute bony abnormality. IMPRESSION: Worsening bilateral airspace disease and layering effusions. Electronically Signed   By: Rolm Baptise M.D.   On: 11/04/2018 07:16   Dg Chest Port 1 View  Result Date: 11/02/2018 CLINICAL DATA:  Shortness of breath with hypotension and hypoxia. EXAM: PORTABLE CHEST 1 VIEW COMPARISON:  11/01/2018 FINDINGS: Endotracheal tube and enteric tube unchanged. Lungs are adequately inflated and demonstrate hazy opacification over the right base slightly worse. Overall improvement in previously noted patchy bilateral density. No definite effusion. Remainder of the exam is unchanged. IMPRESSION: Interval improvement to near resolution in previously seen bilateral patchy density. Worsened mild hazy opacification over the right base which may be due to atelectasis or infection. Tubes and lines unchanged. Electronically Signed   By: Marin Olp M.D.   On: 11/02/2018 19:37   Dg Chest Port 1 View  Result Date: 11/01/2018 CLINICAL DATA:  Evaluate ETT EXAM: PORTABLE CHEST 1 VIEW COMPARISON:  November 01, 2018 FINDINGS: The ETT is in good position. The NG tube terminates below today's film. The cardiomediastinal silhouette is normal. Coarsened lung markings are identified, worse since the previous chest  x-ray. No focal infiltrate. IMPRESSION: 1. The ETT is in good position. The NG tube terminates below today's film. 2. Increasingly coarsened lung markings may represent bronchitic change or developing atypical infection. Electronically Signed   By: Dorise Bullion III M.D   On: 11/01/2018 19:23   Dg Chest Port 1 View  Result Date: 11/01/2018 CLINICAL DATA:  Cough. EXAM: PORTABLE CHEST 1 VIEW COMPARISON:  Radiographs of November 27, 2010. FINDINGS: The heart size and mediastinal contours are within normal limits. No pneumothorax or pleural effusion is noted. Right lung is clear. Minimal left basilar subsegmental atelectasis or infiltrate is noted. The visualized skeletal structures are unremarkable. IMPRESSION: Minimal left basilar subsegmental atelectasis or infiltrate. Electronically Signed   By: Marijo Conception, M.D.   On: 11/01/2018 10:35   Dg Abd Portable 1v  Result Date: 11/04/2018 CLINICAL DATA:  63 year old female currently intubated with abdominal distension EXAM: PORTABLE ABDOMEN - 1 VIEW COMPARISON:  Abdominal radiograph 11/01/2018 FINDINGS: A nasogastric tube is present. The tip of the tube overlies the gastric body. The visualized lung bases are clear. No evidence of large free air on this supine series. The colon is diffusely distended with gas but not dilated. No significant gaseous distension of small bowel. A right femoral central venous catheter is present. The tip overlies the right common iliac vein. A second catheter is present more laterally, likely representing a arterial catheter within the superficial femoral artery. Lower lumbar degenerative disc disease. No acute osseous abnormality. IMPRESSION: 1. Marked gaseous distension of the colon  without dilation. Findings are most suggestive of colonic ileus. 2. No evidence of small bowel obstruction. 3. The tip of the gastric tube overlies the gastric body. 4. Right femoral arterial and venous lines. Electronically Signed   By: Jacqulynn Cadet M.D.   On: 11/04/2018 10:32   Ct Angio Chest/abd/pel For Dissection W And/or Wo Contrast  Result Date: 11/01/2018 CLINICAL DATA:  Back pain. EXAM: CT ANGIOGRAPHY CHEST, ABDOMEN AND PELVIS TECHNIQUE: Multidetector CT imaging through the chest, abdomen and pelvis was performed using the standard protocol during bolus administration of intravenous contrast. Multiplanar reconstructed images and MIPs were obtained and reviewed to evaluate the vascular anatomy. CONTRAST:  144m ISOVUE-370 IOPAMIDOL (ISOVUE-370) INJECTION 76% COMPARISON:  CT scan of September 01, 2017. FINDINGS: CTA CHEST FINDINGS Cardiovascular: Preferential opacification of the thoracic aorta. No evidence of thoracic aortic aneurysm or dissection. Normal heart size. No pericardial effusion. Mediastinum/Nodes: Small sliding-type hiatal hernia is noted. Thyroid gland is unremarkable. No significant adenopathy is noted. Lungs/Pleura: No pneumothorax or pleural effusion is noted. Mild bilateral posterior basilar subsegmental atelectasis is noted. Mild left lingular subsegmental atelectasis is noted. Musculoskeletal: No chest wall abnormality. No acute or significant osseous findings. Review of the MIP images confirms the above findings. CTA ABDOMEN AND PELVIS FINDINGS VASCULAR Aorta: Atherosclerosis of abdominal aorta is noted without aneurysm or dissection. Celiac: Patent without evidence of aneurysm, dissection, vasculitis or significant stenosis. SMA: Patent without evidence of aneurysm, dissection, vasculitis or significant stenosis. Renals: Both renal arteries are patent without evidence of aneurysm, dissection, vasculitis, fibromuscular dysplasia or significant stenosis. IMA: Patent without evidence of aneurysm, dissection, vasculitis or significant stenosis. Inflow: Patent without evidence of aneurysm, dissection, vasculitis or significant stenosis. Veins: No obvious venous abnormality within the limitations of this arterial phase study.  Review of the MIP images confirms the above findings. NON-VASCULAR Hepatobiliary: No focal liver abnormality is seen. No gallstones, gallbladder wall thickening, or biliary dilatation. Pancreas: Unremarkable. No pancreatic ductal dilatation or surrounding inflammatory changes. Spleen: Normal in size without focal abnormality. Adrenals/Urinary Tract: Stable left adrenal adenoma. Right adrenal gland appears normal. Stable exophytic right renal cyst is noted. No hydronephrosis or renal obstruction is noted. Urinary bladder is unremarkable. Stomach/Bowel: Stomach is within normal limits. Appendix appears normal. No evidence of bowel wall thickening, distention, or inflammatory changes. Lymphatic: No significant adenopathy is noted. Reproductive: Status post hysterectomy. No adnexal masses. Other: No abdominal wall hernia or abnormality. No abdominopelvic ascites. Musculoskeletal: No acute or significant osseous findings. Review of the MIP images confirms the above findings. IMPRESSION: No definite evidence of thoracic or abdominal aortic dissection or aneurysm. No evidence of mesenteric or renal artery stenosis. Mild bibasilar subsegmental atelectasis is noted. Small sliding-type hiatal hernia. Stable left adrenal adenoma. Aortic Atherosclerosis (ICD10-I70.0). Electronically Signed   By: JMarijo Conception M.D.   On: 11/01/2018 11:53   [place]kg Site Rite  Result Date: 11/01/2018 If Site Rite image not attached, placement could not be confirmed due to current cardiac rhythm.   Consults: Treatment Team:  Pccm, AAnder Gaster MD MBeverly Gust MD   Subjective:    Overnight Issues: Sedatives via IV being weaned off.  To have CT scan of the head today.  Objective:  Vital signs for last 24 hours: Temp:  [96.1 F (35.6 C)-99.1 F (37.3 C)] 97.7 F (36.5 C) (04/08 2000) Pulse Rate:  [68-115] 87 (04/08 2000) Resp:  [14-22] 19 (04/08 2000) BP: (87-147)/(51-89) 138/77 (04/08 2000) SpO2:  [86 %-100 %] 95 %  (04/08 2032) FiO2 (%):  [  30 %-40 %] 40 % (04/08 2032) Weight:  [85.2 kg] 85.2 kg (04/08 0441)  Hemodynamic parameters for last 24 hours:    Intake/Output from previous day: 04/07 0701 - 04/08 0700 In: 2379.9 [I.V.:1650.2; NG/GT:729.7] Out: 2450 [Urine:2450]  Intake/Output this shift: No intake/output data recorded.  Vent settings for last 24 hours: Vent Mode: PRVC FiO2 (%):  [30 %-40 %] 40 % Set Rate:  [18 bmp] 18 bmp Vt Set:  [450 mL] 450 mL PEEP:  [5 cmH20] 5 cmH20 Plateau Pressure:  [9 cmH20-14 cmH20] 14 cmH20  Physical Exam:  GENERAL:  Intubated, sedated synchronous with the ventilator.   HEENT:  Pupils equal, round, reactive to light.  No scleral icterus.Moist mucosal membranes.  Orotracheally intubated.  OG in place. NECK: Supple.  Trachea midline. No JVD.  PULMONARY: Coarse breath sounds, no wheezes.  No rhonchi noted. CARDIOVASCULAR: S1 and S2. Regular rate and rhythm. No murmurs, rubs, or gallops.  GASTROINTESTINAL: Soft, protuberant, normoactive bowel sounds.  Tolerating tube feeds. MUSCULOSKELETAL: No swelling, clubbing, or edema.  NEUROLOGIC: Intermittent agitation remains an issue.  Pulling at lines and tubes.  Moves all 4 spontaneously SKIN:intact,warm,dry  Assessment/Plan:  1.  Acute hypoxic respiratory failure: Acute lung injury resolved.  COVID-19 negative, COVID-19 RULED OUT.  No evidence of overt pneumonia on follow-up chest x-rays.  Agitated delirium has been limitation to extubation.  Suspect that episode of aspiration led to aspiration pneumonitis and respiratory failure.  She continues to have issues with delirium which are precluding her weaning.  She may need tracheostomy to safely discontinue all IV sedatives and allow her better chance to wean.  She may need LTACH.  ENT reluctant to provide tracheostomy.  2.  Agitated delirium of uncertain etiology.  ICU delirium versus questionable withdrawal.  Obtain CT head today.  Continue Klonopin and risperidone,  doses adjusted wean off IV infusions of sedatives as tolerated.  3.  Demand ischemia: Resolved with supportive care.  4.  Acute kidney injury now resolving.    Urine output excellent, continue to monitor.  Avoid nephrotoxins.  5.  Febrile illness of uncertain etiology.  CT head will help evaluate sinus disease.  ID following, appreciate input.  Antibiotics per ID.  6.  COPD: No acute exacerbation noted.  Continue bronchodilators and pulmonary toilet.   LOS: 10 days   Additional comments:Multidisciplinary rounds were performed with the ICU team.  Case discussed withpatients husband via telephone.   Critical Care Total Time*: 35 Minutes  C. Derrill Kay, MD [facility]  11/11/2018  *Care during the described time interval was provided by me and/or other providers on the critical care team.  I have reviewed this patient's available data, including medical history, events of note, physical examination and test results as part of my evaluation.

## 2018-11-11 NOTE — TOC Progression Note (Signed)
Transition of Care [facility]) - Progression Note    Patient Details  Name: Cindy Morrison MRN: [id number] Date of Birth: 1956/08/05  [facility] CM/SW Contact  Shelbie Hutching, RN Phone Number: 11/11/2018, 11:25 AM  Clinical Narrative:    RNCM contacted patient's husband and spoke with him about LTAC.  Patient may be a good candidate for LTAC.  Patient has been intubated for 10 days, weaning trials have been unsuccessful, critical care MD has already discussed with the husband about Tracheostomy procedure.  Husband gives permission for LTAC screening to determine patient appropriateness.  RNCM contacted [place] with [facility] and Raquel Sarna with [facility].  RNCM will follow up with husband tomorrow.    Expected Discharge Plan: Long Term Acute Care (LTAC) Barriers to Discharge: Continued Medical Work up(intubated, sedated- failing weaning trials)  Expected Discharge Plan and Services Expected Discharge Plan: [place] (LTAC)   Discharge Planning Services: CM Consult   Living arrangements for the past 2 months: Single Family Home                           Social Determinants of Health (SDOH) Interventions    Readmission Risk Interventions No flowsheet data found.

## 2018-11-12 LAB — BASIC METABOLIC PANEL
Anion gap: 10 (ref 5–15)
BUN: 15 mg/dL (ref 8–23)
CO2: 28 mmol/L (ref 22–32)
Calcium: 8 mg/dL — ABNORMAL LOW (ref 8.9–10.3)
Chloride: 100 mmol/L (ref 98–111)
Creatinine, Ser: 0.38 mg/dL — ABNORMAL LOW (ref 0.44–1.00)
GFR calc Af Amer: >60 mL/min (ref >60)
GFR calc non Af Amer: >60 mL/min (ref >60)
Glucose, Bld: 161 mg/dL — ABNORMAL HIGH (ref 70–99)
Potassium: 3.5 mmol/L (ref 3.5–5.1)
Sodium: 138 mmol/L (ref 135–145)

## 2018-11-12 LAB — MAGNESIUM: Magnesium: 1.8 mg/dL (ref 1.7–2.4)

## 2018-11-12 LAB — CBC
HCT: 31 % — ABNORMAL LOW (ref 36.0–46.0)
Hemoglobin: 10.1 g/dL — ABNORMAL LOW (ref 12.0–15.0)
MCH: 30.3 pg (ref 26.0–34.0)
MCHC: 32.6 g/dL (ref 30.0–36.0)
MCV: 93.1 fL (ref 80.0–100.0)
Platelets: 286 10*3/uL (ref 150–400)
RBC: 3.33 MIL/uL — ABNORMAL LOW (ref 3.87–5.11)
RDW: 12.8 % (ref 11.5–15.5)
WBC: 8.9 10*3/uL (ref 4.0–10.5)
nRBC: 0 % (ref 0.0–0.2)

## 2018-11-12 LAB — GLUCOSE, CAPILLARY
Glucose-Capillary: 146 mg/dL — ABNORMAL HIGH (ref 70–99)
Glucose-Capillary: 155 mg/dL — ABNORMAL HIGH (ref 70–99)
Glucose-Capillary: 129 mg/dL — ABNORMAL HIGH (ref 70–99)
Glucose-Capillary: 130 mg/dL — ABNORMAL HIGH (ref 70–99)
Glucose-Capillary: 120 mg/dL — ABNORMAL HIGH (ref 70–99)

## 2018-11-12 LAB — PHOSPHORUS: Phosphorus: 3.8 mg/dL (ref 2.5–4.6)

## 2018-11-12 MED ORDER — POTASSIUM CHLORIDE 20 MEQ PO PACK
40.0000 meq | PACK | ORAL | Status: AC
  Administered 2018-11-12 (×2): 40 meq
  Filled 2018-11-12 (×2): qty 2

## 2018-11-12 MED ORDER — MIDAZOLAM 50MG/50ML (1MG/ML) PREMIX INFUSION
3.0000 mg/h | INTRAVENOUS | Status: DC
  Administered 2018-11-12 – 2018-11-13 (×3): 3 mg/h via INTRAVENOUS
  Filled 2018-11-12 (×3): qty 50

## 2018-11-12 MED ORDER — SODIUM CHLORIDE 0.9% FLUSH
10.0000 mL | INTRAVENOUS | Status: DC | PRN
  Administered 2018-11-17: 10 mL
  Filled 2018-11-12: qty 40

## 2018-11-12 MED ORDER — MAGNESIUM SULFATE 2 GM/50ML IV SOLN
2.0000 g | Freq: Once | INTRAVENOUS | Status: AC
  Administered 2018-11-12: 2 g via INTRAVENOUS
  Filled 2018-11-12: qty 50

## 2018-11-12 MED ORDER — SODIUM CHLORIDE 0.9% FLUSH
10.0000 mL | Freq: Two times a day (BID) | INTRAVENOUS | Status: DC
  Administered 2018-11-12 – 2018-11-13 (×3): 10 mL
  Administered 2018-11-14: 30 mL
  Administered 2018-11-14 – 2018-11-16 (×4): 10 mL
  Administered 2018-11-16: 30 mL
  Administered 2018-11-17: 10 mL
  Administered 2018-11-17: 20 mL
  Administered 2018-11-18: 30 mL
  Administered 2018-11-19 – 2018-11-22 (×5): 10 mL

## 2018-11-12 NOTE — Progress Notes (Signed)
Pharmacy Electrolyte Monitoring Consult:  Pharmacy consulted to assist in monitoring and replacing electrolytes in this 63 y.o. female admitted on 11/01/2018 with Emesis   Labs:  Sodium (mmol/L)  Date Value  11/12/2018 138  03/16/2014 136   Potassium (mmol/L)  Date Value  11/12/2018 3.5  03/16/2014 3.8   Magnesium (mg/dL)  Date Value  11/12/2018 1.8   Phosphorus (mg/dL)  Date Value  11/12/2018 3.8   Calcium (mg/dL)  Date Value  11/12/2018 8.0 (L)   Calcium, Total (mg/dL)  Date Value  03/16/2014 9.0   Albumin (g/dL)  Date Value  11/06/2018 1.9 (L)  03/16/2014 3.8   Corrected Calcium: 9.2  Assessment/Plan: Patient ordered furosemide 40mg  IV Q12hr. Patient received potassium 64mEq VT x 4 doses on 4/8.   Potassium 17mEq VT x x 2 doses. Magnesium 2g IV x 1.   Will replace for goal potassium ~ 4, goal magnesium ~ 2, and phosphate > 2.5.   Will obtain electrolytes with am labs.   Pharmacy will continue to monitor and adjust per consult.    Simpson,Michael L 11/12/2018 4:48 PM

## 2018-11-12 NOTE — Progress Notes (Signed)
[place] [facility] [place] NAME: Cindy Morrison    MR#:  [id number]  DATE OF BIRTH:  05/01/1956  SUBJECTIVE:  CHIEF COMPLAINT:   Chief Complaint  Patient presents with  . Emesis     Patient remains sedated on the vent.  Requiring four-point restraints for patient safety.  Seen by ENT for tracheostomy tube placement  REVIEW OF SYSTEMS:  ROS Unobtainable due to patient being on the vent.  DRUG ALLERGIES:   Allergies  Allergen Reactions  . Erythromycin Base Swelling    Erythromycin ophthalmic ointment   VITALS:  Blood pressure 120/60, pulse 78, temperature 98.6 F (37 C), temperature source Oral, resp. rate 16, height _0  (1.676 m), weight 82.2 kg, SpO2 98 %. PHYSICAL EXAMINATION:  Physical Exam  GENERAL:Patient sedated on the vent.  Requiring four-point restraints for patient safety  EYES: Pupils equal, round, reactive to light and accommodation. No scleral icterus. Extraocular muscles intact.  HEENT: Head atraumatic, normocephalic. Oropharynx and nasopharynx clear.ETT in place. NECK: Supple, no jugular venous distention. No thyroid enlargement, no tenderness.  LUNGS: +diffuse rhonchi, no wheezing, rales,or crepitation. No use of accessory muscles of respiration.  CARDIOVASCULAR: RRR,S1, S2 normal. No murmurs, rubs, or gallops.  ABDOMEN: Soft, nontender, nondistended. Bowel sounds present. No organomegaly or mass.  EXTREMITIES: No pedal edema, cyanosis, or clubbing.  NEUROLOGIC: unable to assess- intubated and sedated PSYCHIATRIC: unable to assess SKIN: No obvious rash, lesion, or ulcer.  LABORATORY PANEL:  Female CBC Recent Labs  Lab 11/12/18 0329  WBC 8.9  HGB 10.1*  HCT 31.0*  PLT 286   ------------------------------------------------------------------------------------------------------------------ Chemistries  Recent Labs  Lab 11/06/18 0357  11/12/18 0329  NA 140   < > 138  K 3.4*   < > 3.5  CL 102   < > 100   CO2 31   < > 28  GLUCOSE 117*   < > 161*  BUN 12   < > 15  CREATININE 0.45   < > 0.38*  CALCIUM 7.7*   < > 8.0*  MG 2.0   < > 1.8  AST 31  --   --   ALT 42  --   --   ALKPHOS 41  --   --   BILITOT 0.7  --   --    < > = values in this interval not displayed.   RADIOLOGY:  No results found. ASSESSMENT AND PLAN:   Acute hypoxic respiratory failure and moderate ARDS -vent management per CCM failed extubation trials -COVID testing is negative -Seen by ENT for tracheostomy, recommending to hold on tracheostomy tube placement until covered surge improved.  Once COVID-19 test is negative x2 and clearance from command center to proceed he will schedule in near future -Patient status post flexible bronchoscopy done on 11/06/2018.  So far no growth on respiratory cultures  Sepsis- unclear etiology. No signs of UTI or pneumonia. -ID consulted- recommended MRI spine which was done on 11/06/2018 with no discitis.  -continue broad spectrum antibiotics per infectious disease specialist.  Appreciate input.   Patient was initially placed on broad-spectrum IV antibiotics with levofloxacin, Flagyl and vancomycin patient reevaluated by infectious disease specialist and vancomycin discontinued on 11/09/2018  Acute gastroenteritis- resolved. No additional episodes of vomiting.  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 Management plans discussed with the patient, family and they are in agreement.  CODE STATUS: Full Code  TOTAL TIME TAKING CARE OF THIS PATIENT: 34 minutes.   More than 50% of the time was spent in counseling/coordination of care: YES  POSSIBLE D/C IN 2-3 DAYS, DEPENDING ON CLINICAL CONDITION.   [name] M.D on 11/12/2018 at 4:08 PM  Between 7am to 6pm - Pager - [phone]  After 6pm go to [email or web address] - Proofreader  [facility]   Office  [phone]  CC: Primary care physician; Katheren Shams  Note: This dictation was prepared with Dragon dictation along with smaller phrase technology. Any transcriptional errors that result from this process are unintentional.

## 2018-11-12 NOTE — Plan of Care (Signed)
PT remains very agitated; versed gtt initiated with Fentanyl & precedex gtt. Will continue to monitor pt closely.

## 2018-11-12 NOTE — TOC Progression Note (Signed)
Transition of Care [facility]) - Progression Note    Patient Details  Name: Renie TALEIGH GERO MRN: [id number] Date of Birth: 1956/01/04  [facility]) CM/SW Contact  Shelbie Hutching, RN Phone Number: 11/12/2018, 2:53 PM  Clinical Narrative:    RNCM spoke with patient's husband about decision for LTAC, husband would like to pursue LTAC and selects [facility] and gives permission for [facility] to start insurance authorization.  Erika with [facility] notified and will start insurance auth tomorrow morning.     Expected Discharge Plan: Long Term Acute Care (LTAC) Barriers to Discharge: Continued Medical Work up(intubated, sedated- failing weaning trials)  Expected Discharge Plan and Services Expected Discharge Plan: [place] (LTAC)   Discharge Planning Services: CM Consult   Living arrangements for the past 2 months: Single Family Home                           Social Determinants of Health (SDOH) Interventions    Readmission Risk Interventions No flowsheet data found.

## 2018-11-12 NOTE — Progress Notes (Signed)
Peripherally Inserted Central Catheter/Midline Placement  The IV Nurse has discussed with the patient and/or persons authorized to consent for the patient, the purpose of this procedure and the potential benefits and risks involved with this procedure.  The benefits include less needle sticks, lab draws from the catheter, and the patient may be discharged home with the catheter. Risks include, but not limited to, infection, bleeding, blood clot (thrombus formation), and puncture of an artery; nerve damage and irregular heartbeat and possibility to perform a PICC exchange if needed/ordered by physician.  Alternatives to this procedure were also discussed.  Bard Power PICC patient education guide, fact sheet on infection prevention and patient information card has been provided to patient /or left at bedside.    PICC/Midline Placement Documentation  PICC Triple Lumen 11/12/18 PICC Right Brachial 39 cm 0 cm (Active)  Indication for Insertion or Continuance of Line Prolonged intravenous therapies 11/12/2018  8:57 PM  Exposed Catheter (cm) 0 cm 11/12/2018  8:57 PM  Site Assessment Clean;Dry;Intact 11/12/2018  8:57 PM  Lumen #1 Status Flushed;Blood return noted 11/12/2018  8:57 PM  Lumen #2 Status Flushed;Blood return noted 11/12/2018  8:57 PM  Lumen #3 Status Flushed;Blood return noted 11/12/2018  8:57 PM  Dressing Type Transparent 11/12/2018  8:57 PM  Dressing Status Clean;Dry;Intact;Antimicrobial disc in place 11/12/2018  8:57 PM  Dressing Intervention New dressing 11/12/2018  8:57 PM  Dressing Change Due 11/19/18 11/12/2018  8:57 PM   Telephone consent signed by daughter    Synthia Innocent 11/12/2018, 8:58 PM

## 2018-11-12 NOTE — Consult Note (Addendum)
Cindy Morrison, Cindy Morrison [id number] [birth year] [name], MD  Reason for Consult: tracheostomy tube placement  HPI: 63 y.o. female admitted on emesis, back pain, and productive cough on 11/01/2018.  Also general malaise and body aches.  Found to be hypoxic as well in ED.  In ICU patient became more agitated and hypoxic and required emergent intubation and placed on contact precautions high risk.  Nasopharyngeal swab for COVID sent and negative.  Patient also became febrile on 11/02/2018.  WBC went from 16.8 on 11/02/2018 to 4.6 on 11/03/2018 along with lymphopenia.  Patient agitated when attempting to wean from vent.  Good lung volumes per notes today when given sedation vacation.  Allergies:  Allergies  Allergen Reactions  . Erythromycin Base Swelling    Erythromycin ophthalmic ointment    ROS: Review of systems normal other than 12 systems except per HPI.  PMH:  Past Medical History:  Diagnosis Date  . Anxiety   . Cancer ([place])    choroid melanoma left eye  . GERD (gastroesophageal reflux disease)   . Psoriasis     FH:  Family History  Problem Relation Age of Onset  . Heart attack Father   . Lung cancer Father     SH:  Social History   Socioeconomic History  . Marital status: Married    Spouse name: Not on file  . Number of children: Not on file  . Years of education: Not on file  . Highest education level: Not on file  Occupational History  . Not on file  Social Needs  . Financial resource strain: Not on file  . Food insecurity:    Worry: Not on file    Inability: Not on file  . Transportation needs:    Medical: Not on file    Non-medical: Not on file  Tobacco Use  . Smoking status: Current Every Day Smoker    Packs/day: 1.00  . Smokeless tobacco: Never Used  Substance and Sexual Activity  . Alcohol use: Yes    Frequency: Never    Comment: occasionally  . Drug use: Never  . Sexual activity: Not on file  Lifestyle  . Physical activity:    Days per week: Not on  file    Minutes per session: Not on file  . Stress: Not on file  Relationships  . Social connections:    Talks on phone: Not on file    Gets together: Not on file    Attends religious service: Not on file    Active member of club or organization: Not on file    Attends meetings of clubs or organizations: Not on file    Relationship status: Not on file  . Intimate partner violence:    Fear of current or ex partner: Not on file    Emotionally abused: Not on file    Physically abused: Not on file    Forced sexual activity: Not on file  Other Topics Concern  . Not on file  Social History Narrative  . Not on file    PSH:  Past Surgical History:  Procedure Laterality Date  . ABDOMINAL HYSTERECTOMY    . BICEPT TENODESIS Right 12/18/2017   Procedure: BICEPS TENODESIS;  Surgeon: Leim Fabry, MD;  Location: [facility] ORS;  Service: Orthopedics;  Laterality: Right;  . BRAIN TUMOR EXCISION  2007  . COLONOSCOPY    . ESOPHAGOGASTRODUODENOSCOPY    . EYE SURGERY  2012  . MOHS SURGERY  2018  . SHOULDER ARTHROSCOPY WITH OPEN ROTATOR  CUFF REPAIR Right 12/18/2017   Procedure: SHOULDER ARTHROSCOPY WITH OPEN ROTATOR CUFF REPAIR,DISTAL CLAVICLE EXCISION,SUBACROMIAL DECOMPRESSION;  Surgeon: Leim Fabry, MD;  Location: [facility] ORS;  Service: Orthopedics;  Laterality: Right;    Physical  Exam: CHART review only due to COVID pandemic   A/P: 63 y.o. female who presented with GI symptoms and hypoxia who acutely decompensated and required intubation, became febrile and lymphopenic that has since resolved on repeat differential on 4/7.  Initial COVID test negative on 11/01/2018.  Now with failure to wean from vent.  Plan:   In chart review, patient appears to be a tracheostomy tube placement candidate.   Due to COVID pandemic and high risk procedure of tracheostomy causing aersolization and potential contamination of OR staff, protocols are being developed at other institutions and these were reviewed in  determining how to proceed with elective procedures in a COVID pandemic.  Most of these other protocols call for COVID 19 negative x2 with some stating with last result within 48 hours of procedure.  At this institution, current lag time on COVID testing makes this questionable whether it is possible to achieve at this time.  Current nasopharyngeal swabs done correctly also have a ~30% false negative rate and Bronchoalveolar lavage is gold standard and lower respiratory tract samples preferred.  Discussed with [facility] as well given current moratorium on non-urgent and emergency surgeries for protection of providers as well as conservation of critical PPE equipment as even with negative testing, COVID PPE still recommended in protocols.  The recommendation at this time is to hold on tracheostomy tube placement until COVID surge/equipment supply/testing is improved/protocol developed.  Once COVID19 testing x2 negative and clearance from [facility] to proceed, will schedule for near future.  Please obtain tracheostomy consent from family.  I will be happy to discuss over the phone any questions or concerns with family or providers.    Jeannie Fend Takari Duncombe 11/12/2018 12:20 PM

## 2018-11-12 NOTE — Plan of Care (Signed)

## 2018-11-12 NOTE — Progress Notes (Signed)
Spoke with primary RN about PICC placement and informed her that we will place PICC this evening if possible and if not then we will be there tomorrow morning . Patient has CVC at this time .

## 2018-11-13 LAB — GLUCOSE, CAPILLARY
Glucose-Capillary: 101 mg/dL — ABNORMAL HIGH (ref 70–99)
Glucose-Capillary: 116 mg/dL — ABNORMAL HIGH (ref 70–99)
Glucose-Capillary: 126 mg/dL — ABNORMAL HIGH (ref 70–99)
Glucose-Capillary: 126 mg/dL — ABNORMAL HIGH (ref 70–99)
Glucose-Capillary: 128 mg/dL — ABNORMAL HIGH (ref 70–99)
Glucose-Capillary: 133 mg/dL — ABNORMAL HIGH (ref 70–99)
Glucose-Capillary: 157 mg/dL — ABNORMAL HIGH (ref 70–99)

## 2018-11-13 LAB — BLOOD GAS, ARTERIAL
Acid-Base Excess: 1 mmol/L (ref 0.0–2.0)
Bicarbonate: 25.2 mmol/L (ref 20.0–28.0)
Delivery systems: POSITIVE
Expiratory PAP: 6
FIO2: 0.6
Inspiratory PAP: 12
O2 Saturation: 99 %
Patient temperature: 37
pCO2 arterial: 38 mmHg (ref 32.0–48.0)
pH, Arterial: 7.43 (ref 7.350–7.450)
pO2, Arterial: 128 mmHg — ABNORMAL HIGH (ref 83.0–108.0)

## 2018-11-13 LAB — CBC
HCT: 29.1 % — ABNORMAL LOW (ref 36.0–46.0)
Hemoglobin: 9.5 g/dL — ABNORMAL LOW (ref 12.0–15.0)
MCH: 30.8 pg (ref 26.0–34.0)
MCHC: 32.6 g/dL (ref 30.0–36.0)
MCV: 94.5 fL (ref 80.0–100.0)
Platelets: 278 10*3/uL (ref 150–400)
RBC: 3.08 MIL/uL — ABNORMAL LOW (ref 3.87–5.11)
RDW: 13 % (ref 11.5–15.5)
WBC: 9.8 10*3/uL (ref 4.0–10.5)
nRBC: 0 % (ref 0.0–0.2)

## 2018-11-13 LAB — BASIC METABOLIC PANEL
Anion gap: 7 (ref 5–15)
BUN: 21 mg/dL (ref 8–23)
CO2: 28 mmol/L (ref 22–32)
Calcium: 8 mg/dL — ABNORMAL LOW (ref 8.9–10.3)
Chloride: 103 mmol/L (ref 98–111)
Creatinine, Ser: 0.44 mg/dL (ref 0.44–1.00)
GFR calc Af Amer: >60 mL/min (ref >60)
GFR calc non Af Amer: >60 mL/min (ref >60)
Glucose, Bld: 151 mg/dL — ABNORMAL HIGH (ref 70–99)
Potassium: 3.9 mmol/L (ref 3.5–5.1)
Sodium: 138 mmol/L (ref 135–145)

## 2018-11-13 LAB — TRIGLYCERIDES: Triglycerides: 208 mg/dL — ABNORMAL HIGH (ref <150)

## 2018-11-13 LAB — MAGNESIUM: Magnesium: 2.2 mg/dL (ref 1.7–2.4)

## 2018-11-13 MED ORDER — ACETAMINOPHEN 650 MG RE SUPP
650.0000 mg | Freq: Four times a day (QID) | RECTAL | Status: DC | PRN
  Administered 2018-11-13: 650 mg via RECTAL
  Filled 2018-11-13: qty 1

## 2018-11-13 MED ORDER — POTASSIUM CHLORIDE 20 MEQ PO PACK: 40.0000 meq | PACK | Freq: Every day | ORAL | Status: DC

## 2018-11-13 MED ORDER — LORAZEPAM 2 MG/ML IJ SOLN
0.5000 mg | INTRAMUSCULAR | Status: DC | PRN
  Administered 2018-11-13 (×2): 1 mg via INTRAVENOUS
  Filled 2018-11-13 (×3): qty 1

## 2018-11-13 MED ORDER — DEXMEDETOMIDINE HCL IN NACL 400 MCG/100ML IV SOLN
0.0000 ug/kg/h | INTRAVENOUS | Status: DC
  Administered 2018-11-13 – 2018-11-16 (×21): 2 ug/kg/h via INTRAVENOUS
  Administered 2018-11-16: 1.5 ug/kg/h via INTRAVENOUS
  Filled 2018-11-13 (×24): qty 100

## 2018-11-13 MED ORDER — PROPOFOL 1000 MG/100ML IV EMUL: 0.0000 ug/kg/min | INTRAVENOUS | Status: DC

## 2018-11-13 MED ORDER — FENTANYL CITRATE (PF) 100 MCG/2ML IJ SOLN: 50.0000 ug | INTRAMUSCULAR | Status: DC | PRN

## 2018-11-13 MED ORDER — PANTOPRAZOLE SODIUM 40 MG IV SOLR
40.0000 mg | INTRAVENOUS | Status: DC
  Administered 2018-11-13: 40 mg via INTRAVENOUS
  Filled 2018-11-13: qty 40

## 2018-11-13 MED ORDER — MORPHINE SULFATE (PF) 2 MG/ML IV SOLN
2.0000 mg | Freq: Once | INTRAVENOUS | Status: AC
  Administered 2018-11-13: 2 mg via INTRAVENOUS

## 2018-11-13 MED ORDER — IPRATROPIUM-ALBUTEROL 0.5-2.5 (3) MG/3ML IN SOLN
RESPIRATORY_TRACT | Status: AC
  Administered 2018-11-13: 3 mL
  Filled 2018-11-13: qty 3

## 2018-11-13 MED ORDER — LORAZEPAM 2 MG/ML IJ SOLN
2.0000 mg | Freq: Once | INTRAMUSCULAR | Status: AC
  Administered 2018-11-13: 2 mg via INTRAVENOUS

## 2018-11-13 MED ORDER — ETOMIDATE 2 MG/ML IV SOLN: 20.0000 mg | Freq: Once | INTRAVENOUS | Status: DC

## 2018-11-13 MED ORDER — MORPHINE SULFATE (PF) 2 MG/ML IV SOLN: 1.0000 mg | INTRAVENOUS | Status: DC | PRN

## 2018-11-13 MED ORDER — ROCURONIUM BROMIDE 50 MG/5ML IV SOLN: 50.0000 mg | Freq: Once | INTRAVENOUS | Status: DC

## 2018-11-13 MED ORDER — LORAZEPAM 2 MG/ML IJ SOLN
0.5000 mg | INTRAMUSCULAR | Status: DC | PRN
  Administered 2018-11-15 – 2018-11-16 (×2): 1 mg via INTRAVENOUS
  Filled 2018-11-13 (×3): qty 1

## 2018-11-13 MED ORDER — LORAZEPAM 2 MG/ML IJ SOLN
INTRAMUSCULAR | Status: AC
  Filled 2018-11-13: qty 1

## 2018-11-13 MED ORDER — FENTANYL CITRATE (PF) 100 MCG/2ML IJ SOLN: 100.0000 ug | Freq: Once | INTRAMUSCULAR | Status: DC

## 2018-11-13 MED ORDER — ACETAMINOPHEN 325 MG PO TABS
650.0000 mg | ORAL_TABLET | Freq: Four times a day (QID) | ORAL | Status: DC | PRN
  Administered 2018-11-19: 650 mg via ORAL
  Filled 2018-11-13: qty 2

## 2018-11-13 MED ORDER — FUROSEMIDE 10 MG/ML IJ SOLN
40.0000 mg | INTRAMUSCULAR | Status: AC
  Administered 2018-11-13: 40 mg via INTRAVENOUS

## 2018-11-13 MED ORDER — FUROSEMIDE 10 MG/ML IJ SOLN
INTRAMUSCULAR | Status: AC
  Filled 2018-11-13: qty 4

## 2018-11-13 MED ORDER — MORPHINE SULFATE (PF) 2 MG/ML IV SOLN
INTRAVENOUS | Status: AC
  Administered 2018-11-13: 2 mg via INTRAVENOUS
  Filled 2018-11-13: qty 1

## 2018-11-13 MED ORDER — BUDESONIDE 0.25 MG/2ML IN SUSP
0.2500 mg | Freq: Four times a day (QID) | RESPIRATORY_TRACT | Status: DC
  Administered 2018-11-13 – 2018-11-16 (×10): 0.25 mg via RESPIRATORY_TRACT
  Filled 2018-11-13 (×12): qty 2

## 2018-11-13 NOTE — Progress Notes (Signed)
Pharmacy Electrolyte Monitoring Consult:  Pharmacy consulted to assist in monitoring and replacing electrolytes in this 63 y.o. female admitted on 11/01/2018 with Emesis   Labs:  Sodium (mmol/L)  Date Value  11/13/2018 138  03/16/2014 136   Potassium (mmol/L)  Date Value  11/13/2018 3.9  03/16/2014 3.8   Magnesium (mg/dL)  Date Value  11/13/2018 2.2   Phosphorus (mg/dL)  Date Value  11/12/2018 3.8   Calcium (mg/dL)  Date Value  11/13/2018 8.0 (L)   Calcium, Total (mg/dL)  Date Value  03/16/2014 9.0   Albumin (g/dL)  Date Value  11/06/2018 1.9 (L)  03/16/2014 3.8   Corrected Calcium: 9.2  Assessment/Plan: Patient ordered furosemide 40mg  IV Q12hr. Patient received potassium 76mEq VT x 2 doses on 4/9.   Potassium 75mEq VT Daily.   Will replace for goal potassium ~ 4, goal magnesium ~ 2, and phosphate > 2.5.   Will obtain electrolytes with am labs.   Pharmacy will continue to monitor and adjust per consult.    Simpson,Michael L 11/13/2018 9:22 AM

## 2018-11-13 NOTE — Progress Notes (Signed)
Patient was doing okay on 6L [place] but started to have respiratory distress and had to have ativan 2mg , morphine 2 mg and Lasix 40mg .Had svn treatment, but still having increased work of breathing. Placed on bipap and NP was going to reintubate but patient starting to settle down. Continue to monitor.

## 2018-11-13 NOTE — TOC Progression Note (Signed)
Transition of Care [facility]) - Progression Note    Patient Details  Name: Cindy Morrison MRN: [id number] Date of Birth: Sep 27, 1955  Transition of Care [facility]) CM/SW Contact  Shelbie Hutching, RN Phone Number: 11/13/2018, 12:54 PM  Clinical Narrative:    Patient extubated today and placed on [place] at 6L, so far patient is tolerating well, still agitated and confused.  [facility] does not have any beds at this time, Erika with [facility] will follow up on patient on Monday.     Expected Discharge Plan: Long Term Acute Care (LTAC) Barriers to Discharge: Continued Medical Work up(intubated, sedated- failing weaning trials)  Expected Discharge Plan and Services Expected Discharge Plan: [facility] (LTAC)   Discharge Planning Services: CM Consult   Living arrangements for the past 2 months: Single Family Home                           Social Determinants of Health (SDOH) Interventions    Readmission Risk Interventions No flowsheet data found.

## 2018-11-13 NOTE — Progress Notes (Signed)
Discussed via phone information to daughter Nickola Major [phone]. Daughter requesting information regarding medical plan and prognosis from on-site physician today. Daughter concerned about prognosis and respiratory status. Dr. Alva Garnet notified.

## 2018-11-13 NOTE — Progress Notes (Addendum)
   [facility] NAME: Cindy Morrison    MR#:  [id number]  DATE OF BIRTH:  1955/11/14  SUBJECTIVE:  CHIEF COMPLAINT:   Chief Complaint  Patient presents with  . Emesis     Patient got extubated 11/13/18, on Precedex drip requiring four-point restraints for patient safety.    REVIEW OF SYSTEMS:  ROS Unobtainable due to patient being on the vent during my exam  DRUG ALLERGIES:   Allergies  Allergen Reactions  . Erythromycin Base Swelling    Erythromycin ophthalmic ointment   VITALS:  Blood pressure 119/65, pulse (!) 107, temperature 97.6 F (36.4 C), temperature source Oral, resp. rate 18, height 5\' 6"  (1.676 m), weight 81.2 kg, SpO2 99 %. PHYSICAL EXAMINATION:  Physical Exam  GENERAL:Patient sedated on the vent.  Requiring four-point restraints for patient safety  EYES: Pupils equal, round, reactive to light and accommodation. No scleral icterus. Extraocular muscles intact.  HEENT: Head atraumatic, normocephalic. Oropharynx and nasopharynx clear.ETT in place. NECK: Supple, no jugular venous distention. No thyroid enlargement, no tenderness.  LUNGS: +diffuse rhonchi, no wheezing, rales,or crepitation. No use of accessory muscles of respiration.  CARDIOVASCULAR: RRR,S1, S2 normal. No murmurs, rubs, or gallops.  ABDOMEN: Soft, nontender, nondistended. Bowel sounds present.  EXTREMITIES: No pedal edema, cyanosis, or clubbing.  NEUROLOGIC: unable to assess- intubated and sedated PSYCHIATRIC: unable to assess SKIN: No obvious rash, lesion, or ulcer.  LABORATORY PANEL:  Female CBC Recent Labs  Lab 11/13/18 0434  WBC 9.8  HGB 9.5*  HCT 29.1*  PLT 278   ------------------------------------------------------------------------------------------------------------------ Chemistries  Recent Labs  Lab 11/13/18 0434  NA 138  K 3.9  CL 103  CO2 28  GLUCOSE 151*  BUN 21  CREATININE 0.44  CALCIUM 8.0*  MG 2.2   RADIOLOGY:   No results found. ASSESSMENT AND PLAN:   Acute hypoxic respiratory failure and moderate ARDS -Extubated 11/13/2018 -On Precedex drip Ativan as needed for agitation -COVID testing is negative -  So far no growth on respiratory cultures.  MRSA PCR negative  Sepsis- unclear etiology. No signs of UTI or pneumonia. -ID consulted- recommended MRI spine which was done on 11/06/2018 with no discitis.  -Received broad spectrum antibiotics per infectious disease specialist.  Appreciate input.   Patient was initially placed on broad-spectrum IV antibiotics with levofloxacin, Flagyl and vancomycin patient reevaluated by infectious disease specialist and vancomycin discontinued on 11/09/2018  Acute gastroenteritis- resolved. No additional episodes of vomiting.  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 Management plans intensivist discussed with the family and they are in agreement.  CODE STATUS: Full Code  TOTAL TIME TAKING CARE OF THIS PATIENT: 32 minutes.   More than 50% of the time was spent in counseling/coordination of care: YES  POSSIBLE D/C IN ? DAYS, DEPENDING ON CLINICAL CONDITION.   [name] M.D on 11/13/2018 at 1:54 PM  Between 7am to 6pm - Pager - [phone]  After 6pm go to [email or web address] - Proofreader  [employer] [facility]  Office  [phone]  CC: Primary care physician; Katheren Shams  Note: This dictation was prepared with Dragon dictation along with smaller phrase technology. Any transcriptional errors that result from this process are unintentional.

## 2018-11-13 NOTE — Progress Notes (Signed)
Follow up - Critical Care Medicine Note  Patient Details:    Cindy Morrison is an 63 y.o. female. Admitted on 29 March with acute respiratory failure, COVID-19 ruled out.  She remains mechanically ventilated in the ICU.  Issues with agitation have limited weaning. Lines, Airways, Drains: Airway 7 mm (Active)  Secured at (cm) 23 cm 11/09/2018  8:12 PM  Measured From Lips 11/09/2018  8:12 PM  Secured Location Right 11/09/2018  8:12 PM  Secured By [employer] 11/09/2018  8:12 PM  Tube Holder Repositioned Yes 11/09/2018  8:12 PM  Cuff Pressure (cm H2O) 28 cm H2O 11/09/2018  8:12 PM  Site Condition Dry 11/09/2018  8:12 PM     CVC Triple Lumen 11/01/18 Right (Active)  Indication for Insertion or Continuance of Line Vasoactive infusions 11/09/2018 10:00 PM  Site Assessment Clean;Dry;Intact 11/09/2018 10:00 PM  Proximal Lumen Status Infusing 11/09/2018 10:00 PM  Medial Lumen Status Infusing 11/09/2018 10:00 PM  Distal Lumen Status Infusing 11/09/2018 10:00 PM  Dressing Type Transparent;Occlusive 11/09/2018 10:00 PM  Dressing Status Clean;Dry;Intact;Antimicrobial disc in place 11/09/2018 10:00 PM  Line Care Connections checked and tightened 11/09/2018 10:00 PM  Dressing Intervention Dressing changed 11/09/2018 10:00 PM  Dressing Change Due 11/16/18 11/09/2018 10:00 PM     NG/OG Tube Orogastric Center mouth Xray  (Active)  Cm Marking at Nare/Corner of Mouth (if applicable) 68 cm 09/13/5282  8:00 PM  Site Assessment Clean;Dry;Intact 11/09/2018  8:00 PM  Ongoing Placement Verification No change in cm markings or external length of tube from initial placement;No change in respiratory status;No acute changes, not attributed to clinical condition 11/09/2018  8:00 PM  Status Infusing tube feed 11/09/2018  8:00 PM  Amount of suction 80 mmHg 11/09/2018  4:00 PM  Drainage Appearance Bile;Brown 11/09/2018  4:00 PM  Intake (mL) 140 mL 11/05/2018  8:00 AM  Output (mL) 130 mL 11/09/2018  1:41 PM     Urethral Catheter Britt Boozer, RN  Double-lumen;Latex 14 Fr. (Active)  Indication for Insertion or Continuance of Catheter Unstable critically ill patients first 24-48 hours (See Criteria) 11/09/2018  8:00 PM  Site Assessment Clean;Intact;Dry 11/09/2018  8:00 PM  Catheter Maintenance Bag below level of bladder;Catheter secured;Drainage bag/tubing not touching floor;Insertion date on drainage bag;No dependent loops;Seal intact 11/09/2018  8:00 PM  Collection Container Standard drainage bag 11/09/2018  8:00 PM  Securement Method Securing device (Describe) 11/09/2018  8:00 PM  Urinary Catheter Interventions Unclamped 11/09/2018  8:00 PM  Output (mL) 450 mL 11/09/2018  8:30 PM    Anti-infectives:  Anti-infectives (From admission, onward)   Start     Dose/Rate Route Frequency Ordered Stop   11/07/18 1800  levofloxacin (LEVAQUIN) IVPB 750 mg  Status:  Discontinued     750 mg 100 mL/hr over 90 Minutes Intravenous Every 24 hours 11/06/18 2027 11/12/18 1457   11/06/18 1915  levofloxacin (LEVAQUIN) IVPB 750 mg  Status:  Discontinued     750 mg 100 mL/hr over 90 Minutes Intravenous Every 24 hours 11/06/18 1913 11/06/18 2027   11/04/18 2000  vancomycin (VANCOCIN) 1,000 mg in sodium chloride 0.9 % 250 mL IVPB  Status:  Discontinued     1,000 mg 250 mL/hr over 60 Minutes Intravenous Every 12 hours 11/04/18 0936 11/04/18 0941   11/04/18 2000  vancomycin (VANCOCIN) IVPB 1000 mg/200 mL premix  Status:  Discontinued     1,000 mg 200 mL/hr over 60 Minutes Intravenous Every 12 hours 11/04/18 0941 11/09/18 1543   11/04/18 2000  ceFEPIme (MAXIPIME) 2 g in sodium chloride 0.9 % 100 mL IVPB  Status:  Discontinued     2 g 200 mL/hr over 30 Minutes Intravenous Every 8 hours 11/04/18 1150 11/06/18 1913   11/04/18 0200  vancomycin (VANCOCIN) IVPB 750 mg/150 ml premix  Status:  Discontinued     750 mg 150 mL/hr over 60 Minutes Intravenous Every 8 hours 11/03/18 2136 11/04/18 0936   11/03/18 2200  piperacillin-tazobactam (ZOSYN) IVPB 3.375 g  Status:   Discontinued     3.375 g 12.5 mL/hr over 240 Minutes Intravenous Every 8 hours 11/03/18 1729 11/03/18 1748   11/03/18 2000  doxycycline (VIBRAMYCIN) 100 mg in sodium chloride 0.9 % 250 mL IVPB  Status:  Discontinued     100 mg 125 mL/hr over 120 Minutes Intravenous Every 12 hours 11/03/18 1719 11/03/18 1748   11/03/18 2000  ceFEPIme (MAXIPIME) 2 g in sodium chloride 0.9 % 100 mL IVPB  Status:  Discontinued     2 g 200 mL/hr over 30 Minutes Intravenous Every 12 hours 11/03/18 1748 11/04/18 1150   11/03/18 2000  metroNIDAZOLE (FLAGYL) IVPB 500 mg  Status:  Discontinued     500 mg 100 mL/hr over 60 Minutes Intravenous Every 8 hours 11/03/18 1748 11/11/18 1454   11/03/18 1800  vancomycin (VANCOCIN) 1,500 mg in sodium chloride 0.9 % 500 mL IVPB     1,500 mg 250 mL/hr over 120 Minutes Intravenous  Once 11/03/18 1748 11/04/18 0700   11/03/18 0800  meropenem (MERREM) 1 g in sodium chloride 0.9 % 100 mL IVPB  Status:  Discontinued     1 g 200 mL/hr over 30 Minutes Intravenous Every 8 hours 11/03/18 0626 11/03/18 1729   11/02/18 2230  meropenem (MERREM) 1 g in sodium chloride 0.9 % 100 mL IVPB  Status:  Discontinued     1 g 200 mL/hr over 30 Minutes Intravenous Every 12 hours 11/02/18 2222 11/03/18 0626   11/02/18 2215  vancomycin (VANCOCIN) IVPB 750 mg/150 ml premix  Status:  Discontinued     750 mg 150 mL/hr over 60 Minutes Intravenous  Once 11/02/18 2211 11/02/18 2212   11/02/18 2211  vancomycin variable dose per unstable renal function (pharmacist dosing)  Status:  Discontinued      Does not apply See admin instructions 11/02/18 2211 11/02/18 2212   11/02/18 2000  cefTRIAXone (ROCEPHIN) 2 g in sodium chloride 0.9 % 100 mL IVPB  Status:  Discontinued     2 g 200 mL/hr over 30 Minutes Intravenous Every 24 hours 11/02/18 1111 11/02/18 2222   11/02/18 2000  doxycycline (VIBRAMYCIN) 200 mg in dextrose 5 % 250 mL IVPB  Status:  Discontinued     200 mg 125 mL/hr over 120 Minutes Intravenous Every  12 hours 11/02/18 1111 11/03/18 1719   11/01/18 1730  cefTRIAXone (ROCEPHIN) 2 g in sodium chloride 0.9 % 100 mL IVPB  Status:  Discontinued     2 g 200 mL/hr over 30 Minutes Intravenous Every 24 hours 11/01/18 1727 11/02/18 1111   11/01/18 1730  doxycycline (VIBRAMYCIN) 200 mg in dextrose 5 % 250 mL IVPB  Status:  Discontinued     200 mg 125 mL/hr over 120 Minutes Intravenous Every 12 hours 11/01/18 1727 11/02/18 1111      Microbiology: Results for orders placed or performed during the hospital encounter of 11/01/18  Novel Coronavirus, NAA (hospital order; send-out to ref lab)     Status: None   Collection Time: 11/01/18  3:08 PM  Result Value Ref Range Status   SARS-CoV-2, NAA NOT DETECTED NOT DETECTED Final    Comment: Negative (Not Detected) results do not exclude infection caused by SARS CoV 2 and should not be used as the sole basis for treatment or other patient management decisions. Optimum specimen types and timing for peak viral levels during infections caused  by SARS CoV 2 have not been determined. Collection of multiple specimens (types and time points) from the same patient may be necessary to detect the virus. Improper specimen collection and handling, sequence variability underlying assay primers and or probes, or the presence of organisms in  quantities less than the limit of detection of the assay may lead to false negative results. Positive and negative predictive values of testing are highly dependent on prevalence. False negative results are more likely when prevalence of disease is high. (NOTE) The expected result is Negative (Not Detected). The SARS CoV 2 test is intended for the presumptive qualitative  detection of nucleic acid from SARS CoV 2 in upper and lower  respir atory specimens. Testing methodology is real time RT PCR. Test results must be correlated with clinical presentation and  evaluated in the context of other laboratory and epidemiologic data.  Test  performance can be affected because the epidemiology and  clinical spectrum of infection caused by SARS CoV 2 is not fully  known. For example, the optimum types of specimens to collect and  when during the course of infection these specimens are most likely  to contain detectable viral RNA may not be known. This test has not been Food and Drug Administration (FDA) cleared or  approved and has been authorized by FDA under an Emergency Use  Authorization (EUA). The test is only authorized for the duration of  the declaration that circumstances exist justifying the authorization  of emergency use of in vitro diagnostic tests for detection and or  diagnosis of SARS CoV 2 under Section 564(b)(1) of the Act, 21 U.S.C.  section [phone] 3(b)(1), unless the authorization is terminated or   revoked sooner. [facility] is certified under the  Clinical Laboratory Improvement Amendments of 1988 (CLIA), 42 U.S.C.  section [phone], to perform high complexity tests. Performed at [id number] [address], [place], [place] [address] Laboratory Director: Loleta Books, MD Performed at [facility], [place] [address] [place], [place] [address]    Coronavirus Source NASOPHARYNGEAL  Final    Comment: Performed at [facility], [address] [place], [place] [address]  Respiratory Panel by PCR     Status: None   Collection Time: 11/02/18 12:01 AM  Result Value Ref Range Status   Adenovirus NOT DETECTED NOT DETECTED Final   Coronavirus 229E NOT DETECTED NOT DETECTED Final    Comment: (NOTE) The Coronavirus on the Respiratory Panel, DOES NOT test for the novel  Coronavirus (2019 nCoV)    Coronavirus HKU1 NOT DETECTED NOT DETECTED Final   Coronavirus NL63 NOT DETECTED NOT DETECTED Final   Coronavirus OC43 NOT DETECTED NOT DETECTED Final   Metapneumovirus NOT DETECTED NOT DETECTED Final   Rhinovirus / Enterovirus NOT  DETECTED NOT DETECTED Final   Influenza A NOT DETECTED NOT DETECTED Final   Influenza B NOT DETECTED NOT DETECTED Final   Parainfluenza Virus 1 NOT DETECTED NOT DETECTED Final   Parainfluenza Virus 2 NOT DETECTED NOT DETECTED Final   Parainfluenza Virus 3 NOT DETECTED NOT DETECTED Final  Parainfluenza Virus 4 NOT DETECTED NOT DETECTED Final   Respiratory Syncytial Virus NOT DETECTED NOT DETECTED Final   Bordetella pertussis NOT DETECTED NOT DETECTED Final   Chlamydophila pneumoniae NOT DETECTED NOT DETECTED Final   Mycoplasma pneumoniae NOT DETECTED NOT DETECTED Final    Comment: Performed at [facility], [place] [address], [place], [place] [address]  MRSA PCR Screening     Status: None   Collection Time: 11/02/18 12:01 AM  Result Value Ref Range Status   MRSA by PCR NEGATIVE NEGATIVE Final    Comment:        The GeneXpert MRSA Assay (FDA approved for NASAL specimens only), is one component of a comprehensive MRSA colonization surveillance program. It is not intended to diagnose MRSA infection nor to guide or monitor treatment for MRSA infections. Performed at [facility], [address] [place], [place] [address]   Culture, blood (Routine X 2) w Reflex to ID Panel     Status: None   Collection Time: 11/02/18  2:40 AM  Result Value Ref Range Status   Specimen Description BLOOD RIGHT WRIST  Final   Special Requests   Final    BOTTLES DRAWN AEROBIC AND ANAEROBIC Blood Culture adequate volume   Culture   Final    NO GROWTH 5 DAYS Performed at [facility], [address] [place], [place] [address]    Report Status 11/07/2018 FINAL  Final  Culture, blood (Routine X 2) w Reflex to ID Panel     Status: None   Collection Time: 11/02/18  2:47 AM  Result Value Ref Range Status   Specimen Description BLOOD RIGHT HAND  Final   Special Requests   Final    BOTTLES DRAWN AEROBIC AND ANAEROBIC Blood Culture adequate volume   Culture   Final    NO GROWTH 5  DAYS Performed at [facility], [address], [place], [place] [address]    Report Status 11/07/2018 FINAL  Final  Culture, respiratory (non-expectorated)     Status: None   Collection Time: 11/06/18  1:52 PM  Result Value Ref Range Status   Specimen Description   Final    BRONCHIAL ALVEOLAR LAVAGE Performed at [facility], [address], [place], [place] [address]    Special Requests   Final    NONE Performed at [facility], [address] [place], [place] [address]    Gram Stain   Final    ABUNDANT WBC PRESENT,BOTH PMN AND MONONUCLEAR NO ORGANISMS SEEN Performed at [facility], [place] [address] [place], [place] [address]    Culture FEW CANDIDA ALBICANS  Final   Report Status 11/09/2018 FINAL  Final    Best Practice/Protocols:  VTE Prophylaxis: Lovenox (prophylaxtic dose) GI Prophylaxis: Antihistamine Continous Sedation  Events:   Studies: Dg Abd 1 View  Result Date: 11/01/2018 CLINICAL DATA:  Evaluate NG tube. EXAM: ABDOMEN - 1 VIEW COMPARISON:  None FINDINGS: The NG tube terminates in the distal stomach. IMPRESSION: The NG tube terminates in the distal stomach. No other abnormalities. Electronically Signed   By: Dorise Bullion III M.D   On: 11/01/2018 19:23   Ct Head Wo Contrast  Result Date: 11/11/2018 CLINICAL DATA:  Acute hypoxic respiratory failure. ARDS. Sepsis of unclear etiology. Altered mental status. EXAM: CT HEAD WITHOUT CONTRAST TECHNIQUE: Contiguous axial images were obtained from the base of the skull through the vertex without intravenous contrast. COMPARISON:  MRI, 11/21/2010.  Brain CT, 04/07/2008. FINDINGS:  Brain: No evidence of acute infarction, hemorrhage, hydrocephalus, extra-axial collection or mass lesion/mass effect. Minor periventricular white matter hypoattenuation noted consistent with chronic microvascular ischemic change. Vascular: No hyperdense vessel or unexpected calcification. 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 No fracture or skull lesion. Sinuses/Orbits: Visualize globes and orbits are unremarkable. There is sinus disease. Dependent fluid noted in the visualized right maxillary sinus. Sphenoid sinuses are opacified. Mild ethmoid sinus mucosal thickening with a posterior ethmoid air cell opacified on the left. Dependent fluid in the left frontal sinus. Some fluid noted in mastoid air cells on the left. Other: None IMPRESSION: 1. No acute intracranial abnormalities. 2. Minor chronic microvascular ischemic change. 3. Sinus disease as described including fluid levels. Consider acute sinusitis in the proper clinical setting. Electronically Signed   By: Lajean Manes M.D.   On: 11/11/2018 12:31   Mr Thoracic Spine W Wo Contrast  Result Date: 11/06/2018 CLINICAL DATA:  Patient with history of multiple myeloma. Acute presentation with back pain. Persistent fevers. EXAM: MRI THORACIC WITHOUT AND WITH CONTRAST TECHNIQUE: Multiplanar and multiecho pulse sequences of the thoracic spine were obtained without and with intravenous contrast. CONTRAST:  9 cc Gadavist COMPARISON:  CT chest done 5 days ago. FINDINGS: MRI THORACIC SPINE FINDINGS Alignment:  Normal Vertebrae: No fracture or focal bone lesion. No evidence of bone or disc space infection. Cord:  Normal Paraspinal and other soft tissues: Widespread bilateral pulmonary infiltrates have developed since the previous chest CT consistent with pneumonia. There are layering pleural effusions right more than left. Disc levels: Ordinary mild bulging of T7-8 and T3-4 without evidence of compressive stenosis. No facet arthropathy. IMPRESSION: No likely significant spinal finding. Mild non-compressive disc bulges at T3-4 and T7-8. No evidence of spinal infection. Markedly worsened pulmonary infiltrates and bilateral pleural effusions when compared to the previous chest CT. Electronically Signed   By: Nelson Chimes M.D.   On: 11/06/2018 17:06    Mr Lumbar Spine W Wo Contrast  Result Date: 11/06/2018 CLINICAL DATA:  History of malignant melanoma. Acute presentation with back pain. Persistent fever. EXAM: MRI LUMBAR SPINE WITHOUT AND WITH CONTRAST TECHNIQUE: Multiplanar and multiecho pulse sequences of the lumbar spine were obtained without and with intravenous contrast. CONTRAST:  9 cc Gadavist COMPARISON:  CT abdomen 09/01/2017. FINDINGS: Segmentation:  5 lumbar type vertebral bodies. Alignment: Mild curvature convex to the left with the apex at L4. 3 mm degenerative anterolisthesis L5-S1. Vertebrae: Edema within the L3, L4 and L5 vertebral bodies most consistent with degenerative discogenic edema. Conus medullaris and cauda equina: Conus extends to the L1 level. Conus and cauda equina appear normal. Paraspinal and other soft tissues: Negative Disc levels: T12-L1: Shallow protrusion of the disc in the midline. This indents the thecal sac but does not appear to cause neural compression. L1-2 and L2-3: Normal. L3-4: Chronic disc degeneration with endplate osteophytes and shallow protrusion of the disc. Narrowing of the disc height. Facet and ligamentous hypertrophy. Moderate multifactorial stenosis that could be symptomatic. Findings appear similar to the CT scan of last year. L4-5: Chronic disc degeneration with endplate osteophytes and shallow protrusion of the disc. Narrowing of the disc space. Facet and ligamentous hypertrophy. Stenosis of the lateral recesses and foramina right more than left. Neural compression could occur at this level. Similar appearance to the study of the previous CT. L5-S1: Advanced bilateral facet arthropathy with 3 mm of anterolisthesis. Mild bulging of the disc. Stenosis of the subarticular lateral recesses that could possibly cause neural  compression. Similar appearance to the previous CT. There is no finding to strongly suggest spinal infection. The L3-4, L4-5 and L5-S1 findings are quite likely chronic and degenerative,  particularly when viewed in combination with the CT study of January 2019. 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 Spinal stenosis that could cause neural compression. Discogenic vertebral body edema that could be associated with back pain. See above discussion. L5-S1 facet arthropathy with degenerative anterolisthesis of 3 mm. Stenosis of the subarticular lateral recesses. Findings at this level could also be symptomatic. Electronically Signed   By: Nelson Chimes M.D.   On: 11/06/2018 17:03   Dg Chest Port 1 View  Result Date: 11/11/2018 CLINICAL DATA:  Acute respiratory failure EXAM: PORTABLE CHEST 1 VIEW COMPARISON:  Two days ago FINDINGS: Endotracheal tube tip at the clavicular heads. The orogastric tube at least reaches the stomach. Improved aeration, especially on the left. Normal heart size. Aortic tortuosity. IMPRESSION: 1. Stable hardware positioning. 2. Improved aeration on the left. Electronically Signed   By: Monte Fantasia M.D.   On: 11/11/2018 07:30   Dg Chest Port 1 View  Result Date: 11/09/2018 CLINICAL DATA:  Acute respiratory failure EXAM: PORTABLE CHEST 1 VIEW COMPARISON:  11/08/2018 FINDINGS: Endotracheal and NG tubes stable. Normal heart size. Bilateral patchy airspace opacities which are predominantly in the upper lobes have improved. No pneumothorax. No pleural effusion. IMPRESSION: Improving patchy bilateral airspace opacities. Electronically Signed   By: Marybelle Killings M.D.   On: 11/09/2018 07:35   Dg Chest Port 1 View  Result Date: 11/08/2018 CLINICAL DATA:  Respiratory distress. Intubated patient. Follow-up exam. EXAM: PORTABLE CHEST 1 VIEW COMPARISON:  Prior studies, most recent dated 11/06/2018. FINDINGS: There is been mild improvement in lung opacity since the prior study, with decreased opacity at the left lung base and less confluent opacity in the right upper lung. This apparent change may be due to larger lung volumes on the current exam. There are no  new lung abnormalities. No pneumothorax. Endotracheal tube and nasal/orogastric tube are stable and well positioned. IMPRESSION: 1. Mild improvement in lung aeration is suggested, although this apparent change may be due to larger lung volumes on the current exam compared to the most recent prior study. 2. No new abnormalities. 3. Stable well-positioned support apparatus. Electronically Signed   By: Lajean Manes M.D.   On: 11/08/2018 08:37   Dg Chest Port 1 View  Result Date: 11/06/2018 CLINICAL DATA:  Acute respiratory failure EXAM: PORTABLE CHEST 1 VIEW COMPARISON:  11/05/2018 FINDINGS: Endotracheal tube terminates 4.5 cm above the carina. Enteric tube courses into the stomach. Multifocal patchy opacities, right upper lobe predominant, compatible with multifocal pneumonia (reportedly on the basis of aspiration, mildly progressive. Possible small left pleural effusion. No pneumothorax. The heart is normal in size. IMPRESSION: Endotracheal tube terminates 4.5 cm above the carina. Multifocal pneumonia, right upper lobe predominant, mildly progressive. Possible small left pleural effusion. Electronically Signed   By: Julian Hy M.D.   On: 11/06/2018 03:56   Dg Chest Port 1 View  Result Date: 11/05/2018 CLINICAL DATA:  Acute respiratory failure EXAM: PORTABLE CHEST 1 VIEW COMPARISON:  Yesterday FINDINGS: Endotracheal tube tip at the clavicular heads. The orogastric tube tip reaches the stomach. Increased hazy opacification of the bilateral chest with pleural fluid seen at the bases. Normal heart size. No pneumothorax. IMPRESSION: 1. Stable hardware positioning. 2. History of large volume aspiration with increased chest opacification including pleural fluid. Electronically Signed   By: Angelica Chessman  Watts M.D.   On: 11/05/2018 04:47   Dg Chest Port 1 View  Result Date: 11/04/2018 CLINICAL DATA:  Rule out covid.  Intubated. EXAM: PORTABLE CHEST 1 VIEW COMPARISON:  11/04/2018 FINDINGS: Endotracheal tube and  NG tube are unchanged. Heart is borderline in size. Mild vascular congestion. Mild interstitial prominence within the lungs. No visible effusions or acute bony abnormality. IMPRESSION: Peribronchial thickening and interstitial prominence. This could reflect edema or atypical/viral infection. Electronically Signed   By: Rolm Baptise M.D.   On: 11/04/2018 10:32   Dg Chest Port 1 View  Result Date: 11/04/2018 CLINICAL DATA:  Acute respiratory failure EXAM: PORTABLE CHEST 1 VIEW COMPARISON:  11/02/2018 FINDINGS: Support devices are unchanged. Cardiomegaly. Bilateral perihilar and lower lobe airspace opacities have increased since prior study. Layering effusions bilaterally also increased. No acute bony abnormality. IMPRESSION: Worsening bilateral airspace disease and layering effusions. Electronically Signed   By: Rolm Baptise M.D.   On: 11/04/2018 07:16   Dg Chest Port 1 View  Result Date: 11/02/2018 CLINICAL DATA:  Shortness of breath with hypotension and hypoxia. EXAM: PORTABLE CHEST 1 VIEW COMPARISON:  11/01/2018 FINDINGS: Endotracheal tube and enteric tube unchanged. Lungs are adequately inflated and demonstrate hazy opacification over the right base slightly worse. Overall improvement in previously noted patchy bilateral density. No definite effusion. Remainder of the exam is unchanged. IMPRESSION: Interval improvement to near resolution in previously seen bilateral patchy density. Worsened mild hazy opacification over the right base which may be due to atelectasis or infection. Tubes and lines unchanged. Electronically Signed   By: Marin Olp M.D.   On: 11/02/2018 19:37   Dg Chest Port 1 View  Result Date: 11/01/2018 CLINICAL DATA:  Evaluate ETT EXAM: PORTABLE CHEST 1 VIEW COMPARISON:  November 01, 2018 FINDINGS: The ETT is in good position. The NG tube terminates below today's film. The cardiomediastinal silhouette is normal. Coarsened lung markings are identified, worse since the previous chest  x-ray. No focal infiltrate. IMPRESSION: 1. The ETT is in good position. The NG tube terminates below today's film. 2. Increasingly coarsened lung markings may represent bronchitic change or developing atypical infection. Electronically Signed   By: Dorise Bullion III M.D   On: 11/01/2018 19:23   Dg Chest Port 1 View  Result Date: 11/01/2018 CLINICAL DATA:  Cough. EXAM: PORTABLE CHEST 1 VIEW COMPARISON:  Radiographs of November 27, 2010. FINDINGS: The heart size and mediastinal contours are within normal limits. No pneumothorax or pleural effusion is noted. Right lung is clear. Minimal left basilar subsegmental atelectasis or infiltrate is noted. The visualized skeletal structures are unremarkable. IMPRESSION: Minimal left basilar subsegmental atelectasis or infiltrate. Electronically Signed   By: Marijo Conception, M.D.   On: 11/01/2018 10:35   Dg Abd Portable 1v  Result Date: 11/04/2018 CLINICAL DATA:  63 year old female currently intubated with abdominal distension EXAM: PORTABLE ABDOMEN - 1 VIEW COMPARISON:  Abdominal radiograph 11/01/2018 FINDINGS: A nasogastric tube is present. The tip of the tube overlies the gastric body. The visualized lung bases are clear. No evidence of large free air on this supine series. The colon is diffusely distended with gas but not dilated. No significant gaseous distension of small bowel. A right femoral central venous catheter is present. The tip overlies the right common iliac vein. A second catheter is present more laterally, likely representing a arterial catheter within the superficial femoral artery. Lower lumbar degenerative disc disease. No acute osseous abnormality. IMPRESSION: 1. Marked gaseous distension of the colon without dilation. Findings  are most suggestive of colonic ileus. 2. No evidence of small bowel obstruction. 3. The tip of the gastric tube overlies the gastric body. 4. Right femoral arterial and venous lines. Electronically Signed   By: Jacqulynn Cadet M.D.   On: 11/04/2018 10:32   Ct Angio Chest/abd/pel For Dissection W And/or Wo Contrast  Result Date: 11/01/2018 CLINICAL DATA:  Back pain. EXAM: CT ANGIOGRAPHY CHEST, ABDOMEN AND PELVIS TECHNIQUE: Multidetector CT imaging through the chest, abdomen and pelvis was performed using the standard protocol during bolus administration of intravenous contrast. Multiplanar reconstructed images and MIPs were obtained and reviewed to evaluate the vascular anatomy. CONTRAST:  168m ISOVUE-370 IOPAMIDOL (ISOVUE-370) INJECTION 76% COMPARISON:  CT scan of September 01, 2017. FINDINGS: CTA CHEST FINDINGS Cardiovascular: Preferential opacification of the thoracic aorta. No evidence of thoracic aortic aneurysm or dissection. Normal heart size. No pericardial effusion. Mediastinum/Nodes: Small sliding-type hiatal hernia is noted. Thyroid gland is unremarkable. No significant adenopathy is noted. Lungs/Pleura: No pneumothorax or pleural effusion is noted. Mild bilateral posterior basilar subsegmental atelectasis is noted. Mild left lingular subsegmental atelectasis is noted. Musculoskeletal: No chest wall abnormality. No acute or significant osseous findings. Review of the MIP images confirms the above findings. CTA ABDOMEN AND PELVIS FINDINGS VASCULAR Aorta: Atherosclerosis of abdominal aorta is noted without aneurysm or dissection. Celiac: Patent without evidence of aneurysm, dissection, vasculitis or significant stenosis. SMA: Patent without evidence of aneurysm, dissection, vasculitis or significant stenosis. Renals: Both renal arteries are patent without evidence of aneurysm, dissection, vasculitis, fibromuscular dysplasia or significant stenosis. IMA: Patent without evidence of aneurysm, dissection, vasculitis or significant stenosis. Inflow: Patent without evidence of aneurysm, dissection, vasculitis or significant stenosis. Veins: No obvious venous abnormality within the limitations of this arterial phase study.  Review of the MIP images confirms the above findings. NON-VASCULAR Hepatobiliary: No focal liver abnormality is seen. No gallstones, gallbladder wall thickening, or biliary dilatation. Pancreas: Unremarkable. No pancreatic ductal dilatation or surrounding inflammatory changes. Spleen: Normal in size without focal abnormality. Adrenals/Urinary Tract: Stable left adrenal adenoma. Right adrenal gland appears normal. Stable exophytic right renal cyst is noted. No hydronephrosis or renal obstruction is noted. Urinary bladder is unremarkable. Stomach/Bowel: Stomach is within normal limits. Appendix appears normal. No evidence of bowel wall thickening, distention, or inflammatory changes. Lymphatic: No significant adenopathy is noted. Reproductive: Status post hysterectomy. No adnexal masses. Other: No abdominal wall hernia or abnormality. No abdominopelvic ascites. Musculoskeletal: No acute or significant osseous findings. Review of the MIP images confirms the above findings. IMPRESSION: No definite evidence of thoracic or abdominal aortic dissection or aneurysm. No evidence of mesenteric or renal artery stenosis. Mild bibasilar subsegmental atelectasis is noted. Small sliding-type hiatal hernia. Stable left adrenal adenoma. Aortic Atherosclerosis (ICD10-I70.0). Electronically Signed   By: JMarijo Conception M.D.   On: 11/01/2018 11:53   [place]kg Site Rite  Result Date: 11/01/2018 If Site Rite image not attached, placement could not be confirmed due to current cardiac rhythm.   Consults: Treatment Team:  Pccm, AAnder Gaster MD MBeverly Gust MD   Subjective:    Overnight Issues: She continues to have episodes of agitation.  She may need trach to be able to wean all IV infusions off.  Objective:  Vital signs for last 24 hours: Temp:  [98.3 F (36.8 C)-98.6 F (37 C)] 98.3 F (36.8 C) (04/09 2000) Pulse Rate:  [64-93] 69 (04/09 2300) Resp:  [15-19] 15 (04/09 2300) BP: (84-133)/(46-80) 93/54 (04/09  2200) SpO2:  [93 %-99 %] 94 % (04/09  2321) FiO2 (%):  [35 %-40 %] 35 % (04/09 2321) Weight:  [82.2 kg] 82.2 kg (04/09 0500)  Hemodynamic parameters for last 24 hours:    Intake/Output from previous day: 04/09 0701 - 04/10 0700 In: 1260.8 [I.V.:1210.8; IV Piggyback:50] Out: 3450 [Urine:3250; Emesis/NG output:200]  Intake/Output this shift: Total I/O In: 287.1 [I.V.:287.1] Out: 950 [Urine:950]  Vent settings for last 24 hours: Vent Mode: PRVC FiO2 (%):  [35 %-40 %] 35 % Set Rate:  [18 bmp] 18 bmp Vt Set:  [450 mL] 450 mL PEEP:  [5 cmH20] 5 cmH20 Plateau Pressure:  [13 cmH20-17 cmH20] 17 cmH20  Physical Exam:  GENERAL:  Intubated, sedated synchronous with the ventilator.  HEAD: Normocephalic, atraumatic.  EYES: Pupils equal, round, reactive to light.  No scleral icterus.  MOUTH: Moist mucosal membranes.  Orotracheally intubated.  OG in place. NECK: Supple.  Trachea midline. No JVD.  PULMONARY: Coarse breath sounds, no wheezes. CARDIOVASCULAR: S1 and S2. Regular rate and rhythm. No murmurs, rubs, or gallops.  GASTROINTESTINAL: Soft, protuberant, normoactive bowel sounds. MUSCULOSKELETAL: No swelling, clubbing, or edema.  NEUROLOGIC: Still with episodes of intermittent agitation.  Moves all 4 spontaneously SKIN:intact,warm,dry  Assessment/Plan:  1.  Acute hypoxic respiratory failure: Acute lung injury resolved.  COVID-19 negative, therefore COVID-19 RULED OUT.  No evidence of overt pneumonia on follow-up chest x-rays.  Agitated delirium has been limitation to extubation.  May require tracheostomy and LTACH.  2.  Agitated delirium of uncertain etiology.  CT head 4/8 non revealing. Klonopin and risperidone via OG; wean off IV infusions of sedatives as tolerated.   3.  Demand ischemia: Resolved with supportive care.   4.  Acute kidney injury now resolving.  Monitor, avoid nephrotoxins.   5.  Febrile illness of uncertain etiology.  CT head revealed some sinusitis.  Patient  had a femoral line in place this was discontinued.  Discussed with ID.  On Levaquin.       LOS: 12 days   Additional comments:Multidisciplinary rounds were performed with the ICU team.  Case discussed withpatients husband via telephone.   Critical Care Total Time*:35 Minutes  C. Derrill Kay, MD [facility]  11/13/2018  *Care during the described time interval was provided by me and/or other providers on the critical care team.  I have reviewed this patient's available data, including medical history, events of note, physical examination and test results as part of my evaluation.

## 2018-11-13 NOTE — Progress Notes (Signed)
55 F adm with fever, cough, septic shock requiring pressors, severe COPD exacerbation and an acute hypoxic/hypercarbic respiratory failure.  Pt self extubated then aspirated requiring reintubation. COVID-19>>NEGATIVE. Extubated under my supervision 04/10. Has been on boatloads of sedation so remains encephalopathic after extubation.  Presently on dexmedetomidine gtt with low dose lorazepam as needed for agitation  Merton Border, MD PCCM service Mobile [phone] Pager [phone] 11/13/2018 5:02 PM

## 2018-11-14 ENCOUNTER — Inpatient Hospital Stay: Payer: Managed Care, Other (non HMO)

## 2018-11-14 LAB — GLUCOSE, CAPILLARY
Glucose-Capillary: 120 mg/dL — ABNORMAL HIGH (ref 70–99)
Glucose-Capillary: 128 mg/dL — ABNORMAL HIGH (ref 70–99)
Glucose-Capillary: 136 mg/dL — ABNORMAL HIGH (ref 70–99)
Glucose-Capillary: 140 mg/dL — ABNORMAL HIGH (ref 70–99)

## 2018-11-14 LAB — BASIC METABOLIC PANEL
Anion gap: 9 (ref 5–15)
BUN: 13 mg/dL (ref 8–23)
CO2: 26 mmol/L (ref 22–32)
Calcium: 7.9 mg/dL — ABNORMAL LOW (ref 8.9–10.3)
Chloride: 103 mmol/L (ref 98–111)
Creatinine, Ser: 0.33 mg/dL — ABNORMAL LOW (ref 0.44–1.00)
GFR calc Af Amer: >60 mL/min (ref >60)
GFR calc non Af Amer: >60 mL/min (ref >60)
Glucose, Bld: 131 mg/dL — ABNORMAL HIGH (ref 70–99)
Potassium: 3.1 mmol/L — ABNORMAL LOW (ref 3.5–5.1)
Sodium: 138 mmol/L (ref 135–145)

## 2018-11-14 LAB — MAGNESIUM: Magnesium: 1.7 mg/dL (ref 1.7–2.4)

## 2018-11-14 MED ORDER — POTASSIUM CHLORIDE 10 MEQ/50ML IV SOLN
10.0000 meq | INTRAVENOUS | Status: AC
  Administered 2018-11-14 (×4): 10 meq via INTRAVENOUS
  Filled 2018-11-14 (×4): qty 50

## 2018-11-14 MED ORDER — ORAL CARE MOUTH RINSE
15.0000 mL | Freq: Two times a day (BID) | OROMUCOSAL | Status: DC
  Administered 2018-11-14 (×2): 15 mL via OROMUCOSAL

## 2018-11-14 MED ORDER — FENTANYL CITRATE (PF) 100 MCG/2ML IJ SOLN
INTRAMUSCULAR | Status: AC
  Filled 2018-11-14: qty 2

## 2018-11-14 MED ORDER — MORPHINE SULFATE (PF) 2 MG/ML IV SOLN
2.0000 mg | INTRAVENOUS | Status: DC | PRN
  Administered 2018-11-14 – 2018-11-15 (×2): 2 mg via INTRAVENOUS
  Filled 2018-11-14: qty 1

## 2018-11-14 MED ORDER — KCL-LACTATED RINGERS-D5W 20 MEQ/L IV SOLN
INTRAVENOUS | Status: DC
  Administered 2018-11-14 – 2018-11-15 (×3): via INTRAVENOUS
  Filled 2018-11-14 (×5): qty 1000

## 2018-11-14 MED ORDER — CHLORHEXIDINE GLUCONATE 0.12 % MT SOLN
15.0000 mL | Freq: Two times a day (BID) | OROMUCOSAL | Status: DC
  Administered 2018-11-14: 15 mL via OROMUCOSAL

## 2018-11-14 MED ORDER — FENTANYL 50 MCG/HR TD PT72
1.0000 | MEDICATED_PATCH | TRANSDERMAL | Status: DC
  Administered 2018-11-14: 1 via TRANSDERMAL
  Filled 2018-11-14: qty 1

## 2018-11-14 MED ORDER — MAGNESIUM SULFATE 2 GM/50ML IV SOLN
2.0000 g | Freq: Once | INTRAVENOUS | Status: AC
  Administered 2018-11-14: 2 g via INTRAVENOUS
  Filled 2018-11-14: qty 50

## 2018-11-14 MED ORDER — ROCURONIUM BROMIDE 50 MG/5ML IV SOLN
INTRAVENOUS | Status: AC
  Filled 2018-11-14: qty 1

## 2018-11-14 MED ORDER — MORPHINE SULFATE (PF) 2 MG/ML IV SOLN
2.0000 mg | INTRAVENOUS | Status: DC | PRN
  Filled 2018-11-14: qty 1

## 2018-11-14 MED ORDER — ETOMIDATE 2 MG/ML IV SOLN
INTRAVENOUS | Status: AC
  Filled 2018-11-14: qty 10

## 2018-11-14 MED ORDER — LORAZEPAM 2 MG/ML IJ SOLN
2.0000 mg | Freq: Once | INTRAMUSCULAR | Status: AC
  Administered 2018-11-14: 2 mg via INTRAVENOUS

## 2018-11-14 MED ORDER — FENTANYL 2500MCG IN NS 250ML (10MCG/ML) PREMIX INFUSION
INTRAVENOUS | Status: AC
  Filled 2018-11-14: qty 250

## 2018-11-14 NOTE — Progress Notes (Signed)
After a rough night last night, she is tolerating extubation well today but remains significantly encephalopathic and requiring dexmedetomidine infusion.  Since she has responded well to morphine and because she wants on high-dose fentanyl infusion for a prolonged period of time, I have ordered a Duragesic patch.  She will continue with low-dose lorazepam injections as needed.  I have ordered procalcitonin for morning to make sure that were not missing an untreated infection.  Merton Border, MD PCCM service Mobile [phone] Pager [phone] 11/14/2018 6:59 PM

## 2018-11-14 NOTE — Progress Notes (Signed)
Pharmacy Electrolyte Monitoring Consult:  Pharmacy consulted to assist in monitoring and replacing electrolytes in this 63 y.o. female admitted on 11/01/2018 with Emesis   Labs:  Sodium (mmol/L)  Date Value  11/14/2018 138  03/16/2014 136   Potassium (mmol/L)  Date Value  11/14/2018 3.1 (L)  03/16/2014 3.8   Magnesium (mg/dL)  Date Value  11/14/2018 1.7   Phosphorus (mg/dL)  Date Value  11/12/2018 3.8   Calcium (mg/dL)  Date Value  11/14/2018 7.9 (L)   Calcium, Total (mg/dL)  Date Value  03/16/2014 9.0   Albumin (g/dL)  Date Value  11/06/2018 1.9 (L)  03/16/2014 3.8   Corrected Calcium: 9.2  Will replace for goal potassium ~ 4, goal magnesium ~ 2, and phosphate > 2.5.   Assessment/Plan: 4/11  K 3.1  Mag 1.7  Scr 0.33  Patient received furosemide 40 IV x 1 last night 4/10  NP has already ordered KCL 10 meq IV x 4 and Magnesium 2 gram IV x 1. Will f/u electrolytes with am labs.   Pharmacy will continue to monitor and adjust per consult.    Akira Perusse A 11/14/2018 7:35 AM

## 2018-11-15 LAB — CBC WITH DIFFERENTIAL/PLATELET
Abs Immature Granulocytes: 0.06 10*3/uL (ref 0.00–0.07)
Basophils Absolute: 0.1 10*3/uL (ref 0.0–0.1)
Basophils Relative: 1 %
Eosinophils Absolute: 0.1 10*3/uL (ref 0.0–0.5)
Eosinophils Relative: 1 %
HCT: 26 % — ABNORMAL LOW (ref 36.0–46.0)
Hemoglobin: 8.7 g/dL — ABNORMAL LOW (ref 12.0–15.0)
Immature Granulocytes: 1 %
Lymphocytes Relative: 12 %
Lymphs Abs: 1.2 10*3/uL (ref 0.7–4.0)
MCH: 31.4 pg (ref 26.0–34.0)
MCHC: 33.5 g/dL (ref 30.0–36.0)
MCV: 93.9 fL (ref 80.0–100.0)
Monocytes Absolute: 0.8 10*3/uL (ref 0.1–1.0)
Monocytes Relative: 8 %
Neutro Abs: 7.6 10*3/uL (ref 1.7–7.7)
Neutrophils Relative %: 77 %
Platelets: 303 10*3/uL (ref 150–400)
RBC: 2.77 MIL/uL — ABNORMAL LOW (ref 3.87–5.11)
RDW: 12.5 % (ref 11.5–15.5)
WBC: 9.8 10*3/uL (ref 4.0–10.5)
nRBC: 0 % (ref 0.0–0.2)

## 2018-11-15 LAB — GLUCOSE, CAPILLARY
Glucose-Capillary: 160 mg/dL — ABNORMAL HIGH (ref 70–99)
Glucose-Capillary: 165 mg/dL — ABNORMAL HIGH (ref 70–99)

## 2018-11-15 LAB — COMPREHENSIVE METABOLIC PANEL
ALT: 35 U/L (ref 0–44)
AST: 29 U/L (ref 15–41)
Albumin: 2.5 g/dL — ABNORMAL LOW (ref 3.5–5.0)
Alkaline Phosphatase: 50 U/L (ref 38–126)
Anion gap: 10 (ref 5–15)
BUN: 10 mg/dL (ref 8–23)
CO2: 23 mmol/L (ref 22–32)
Calcium: 7.9 mg/dL — ABNORMAL LOW (ref 8.9–10.3)
Chloride: 104 mmol/L (ref 98–111)
Creatinine, Ser: 0.44 mg/dL (ref 0.44–1.00)
GFR calc Af Amer: >60 mL/min (ref >60)
GFR calc non Af Amer: >60 mL/min (ref >60)
Glucose, Bld: 161 mg/dL — ABNORMAL HIGH (ref 70–99)
Potassium: 3.1 mmol/L — ABNORMAL LOW (ref 3.5–5.1)
Sodium: 137 mmol/L (ref 135–145)
Total Bilirubin: 0.8 mg/dL (ref 0.3–1.2)
Total Protein: 5.8 g/dL — ABNORMAL LOW (ref 6.5–8.1)

## 2018-11-15 LAB — MAGNESIUM: Magnesium: 1.9 mg/dL (ref 1.7–2.4)

## 2018-11-15 LAB — PROCALCITONIN: Procalcitonin: 0.18 ng/mL

## 2018-11-15 MED ORDER — FENTANYL 25 MCG/HR TD PT72
1.0000 | MEDICATED_PATCH | TRANSDERMAL | Status: DC
  Administered 2018-11-15: 1 via TRANSDERMAL
  Filled 2018-11-15: qty 1

## 2018-11-15 MED ORDER — NICOTINE 14 MG/24HR TD PT24
14.0000 mg | MEDICATED_PATCH | Freq: Every day | TRANSDERMAL | Status: DC
  Administered 2018-11-15 – 2018-11-22 (×6): 14 mg via TRANSDERMAL
  Filled 2018-11-15 (×7): qty 1

## 2018-11-15 MED ORDER — VALPROATE SODIUM 500 MG/5ML IV SOLN
500.0000 mg | Freq: Two times a day (BID) | INTRAVENOUS | Status: DC
  Administered 2018-11-15 – 2018-11-16 (×4): 500 mg via INTRAVENOUS
  Filled 2018-11-15 (×6): qty 5

## 2018-11-15 MED ORDER — POTASSIUM CHLORIDE 10 MEQ/50ML IV SOLN
10.0000 meq | INTRAVENOUS | Status: AC
  Administered 2018-11-15 (×4): 10 meq via INTRAVENOUS
  Filled 2018-11-15 (×4): qty 50

## 2018-11-15 NOTE — Progress Notes (Signed)
She remains intermittently agitated but is more alert and sometimes even conversant.  She is able to follow commands but will not do so consistently.  She did tell me earlier that she was in "hospital" but overall is poorly oriented.  Each time that I have looked at her today she has been comfortable on [place] O2.  Is not clear to me why she has been placed on BiPAP intermittently throughout the last couple of days.  Today, I added valproic acid for delirium.  We should try to wean dexmedetomidine to off.  We will decrease Duragesic patch to 25 mcg/hour strength.  Her husband has been updated in detail.  Merton Border, MD PCCM service Mobile [phone] Pager [phone] 11/15/2018 6:17 PM

## 2018-11-15 NOTE — Progress Notes (Addendum)
Pharmacy Electrolyte Monitoring Consult:  Pharmacy consulted to assist in monitoring and replacing electrolytes in this 63 y.o. female admitted on 11/01/2018 with Emesis   Labs:  Sodium (mmol/L)  Date Value  11/15/2018 137  03/16/2014 136   Potassium (mmol/L)  Date Value  11/15/2018 3.1 (L)  03/16/2014 3.8   Magnesium (mg/dL)  Date Value  11/15/2018 1.9   Phosphorus (mg/dL)  Date Value  11/12/2018 3.8   Calcium (mg/dL)  Date Value  11/15/2018 7.9 (L)   Calcium, Total (mg/dL)  Date Value  03/16/2014 9.0   Albumin (g/dL)  Date Value  11/15/2018 2.5 (L)  03/16/2014 3.8   Corrected Calcium: 9.2  Will replace for goal potassium ~ 4, goal magnesium ~ 2, and phosphate > 2.5.   Assessment/Plan: 4/12  K 3.1  Mag 1.9  Scr 0.44  Patient on D5LR w/ 80meq KCL at 50 ml/hr. Will order KCL 10 meq IV(central line) x 4. Will recheck K at 1800. Will f/u electrolytes with am labs.   Pharmacy will continue to monitor and adjust per consult.    Vianey Caniglia A 11/15/2018 7:33 AM

## 2018-11-15 NOTE — Progress Notes (Signed)
Patients daughter called to check on mother and to see if there has been any improvements. Advised that she is still on BIPAP and very agitated. She requested that we please call if she should have any changes.

## 2018-11-15 NOTE — Progress Notes (Signed)
[place] at [place] NAME: Cindy Morrison    MR#:  [id number]  DATE OF BIRTH:  12/03/1955  SUBJECTIVE:  CHIEF COMPLAINT:   Chief Complaint  Patient presents with  . Emesis     Patient got extubated 11/13/18, patient started communicating in small words More alert with intermittent episodes of agitation.  She feels better she states Still requiring sitter  REVIEW OF SYSTEMS:  ROS Unobtainable due to patient being on the vent during my exam  DRUG ALLERGIES:   Allergies  Allergen Reactions  . Erythromycin Base Swelling    Erythromycin ophthalmic ointment   VITALS:  Blood pressure 134/63, pulse (!) 129, temperature 97.9 F (36.6 C), temperature source Oral, resp. rate (!) 27, height 5\' 6"  (1.676 m), weight 81.2 kg, SpO2 98 %. PHYSICAL EXAMINATION:  Physical Exam  GENERAL:Patient sedated on the vent.  Requiring four-point restraints for patient safety  EYES: Pupils equal, round, reactive to light and accommodation. No scleral icterus. Extraocular muscles intact.  HEENT: Head atraumatic, normocephalic. Oropharynx and nasopharynx clear.ETT in place. NECK: Supple, no jugular venous distention. No thyroid enlargement, no tenderness.  LUNGS: +diffuse rhonchi, no wheezing, rales,or crepitation. No use of accessory muscles of respiration.  CARDIOVASCULAR: RRR,S1, S2 normal. No murmurs, rubs, or gallops.  ABDOMEN: Soft, nontender, nondistended. Bowel sounds present.  EXTREMITIES: No pedal edema, cyanosis, or clubbing.  NEUROLOGIC: unable to assess- intubated and sedated PSYCHIATRIC: unable to assess SKIN: No obvious rash, lesion, or ulcer.  LABORATORY PANEL:  Female CBC Recent Labs  Lab 11/15/18 0547  WBC 9.8  HGB 8.7*  HCT 26.0*  PLT 303   ------------------------------------------------------------------------------------------------------------------ Chemistries  Recent Labs  Lab 11/15/18 0547  NA 137  K 3.1*  CL 104   CO2 23  GLUCOSE 161*  BUN 10  CREATININE 0.44  CALCIUM 7.9*  MG 1.9  AST 29  ALT 35  ALKPHOS 50  BILITOT 0.8   RADIOLOGY:  No results found. ASSESSMENT AND PLAN:   Acute hypoxic respiratory failure and moderate ARDS as well as polypharmacy withdrawal -Extubated 11/13/2018 -On Precedex drip  Off BiPAP Ativan as needed for agitation -COVID testing is negative -  So far no growth on respiratory cultures.  MRSA PCR negative -Sitter for safety  Sepsis- unclear etiology. No signs of UTI or pneumonia. -ID consulted- recommended MRI spine which was done on 11/06/2018 with no discitis.  -Received broad spectrum antibiotics per infectious disease specialist.  Appreciate input.   Patient was initially placed on broad-spectrum IV antibiotics with levofloxacin, Flagyl and vancomycin patient reevaluated by infectious disease specialist and vancomycin discontinued on 11/09/2018  Acute gastroenteritis- resolved. No additional episodes of vomiting.  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 Management plans intensivist discussed with the family and they are in agreement.  CODE STATUS: Full Code  TOTAL TIME TAKING CARE OF THIS PATIENT: 32 minutes.   More than 50% of the time was spent in counseling/coordination of care: YES  POSSIBLE D/C IN ? DAYS, DEPENDING ON CLINICAL CONDITION.   [name] M.D on 11/15/2018 at 2:18 PM  Between 7am to 6pm - Pager - [phone]  After 6pm go to [email or web address] - Proofreader  [facility]  Office  [phone]  CC: Primary care physician; Katheren Shams  Note: This dictation was prepared with Dragon dictation along with smaller phrase technology. Any transcriptional errors that  result from this process are unintentional.

## 2018-11-15 NOTE — Progress Notes (Signed)
   [place] at [place] NAME: Cindy Morrison    MR#:  [id number]  DATE OF BIRTH:  25-Oct-1955  SUBJECTIVE:  CHIEF COMPLAINT:   Chief Complaint  Patient presents with  . Emesis     Patient got extubated 11/13/18, on Precedex drip requiring four-point restraints for patient safety.    REVIEW OF SYSTEMS:  ROS Unobtainable due to patient being on the vent during my exam  DRUG ALLERGIES:   Allergies  Allergen Reactions  . Erythromycin Base Swelling    Erythromycin ophthalmic ointment   VITALS:  Blood pressure 134/63, pulse (!) 129, temperature 97.9 F (36.6 C), temperature source Oral, resp. rate (!) 27, height 5\' 6"  (1.676 m), weight 81.2 kg, SpO2 98 %. PHYSICAL EXAMINATION:  Physical Exam  GENERAL:Patient sedated on the vent.  Requiring four-point restraints for patient safety  EYES: Pupils equal, round, reactive to light and accommodation. No scleral icterus. Extraocular muscles intact.  HEENT: Head atraumatic, normocephalic. Oropharynx and nasopharynx clear.ETT in place. NECK: Supple, no jugular venous distention. No thyroid enlargement, no tenderness.  LUNGS: +diffuse rhonchi, no wheezing, rales,or crepitation. No use of accessory muscles of respiration.  CARDIOVASCULAR: RRR,S1, S2 normal. No murmurs, rubs, or gallops.  ABDOMEN: Soft, nontender, nondistended. Bowel sounds present.  EXTREMITIES: No pedal edema, cyanosis, or clubbing.  NEUROLOGIC: unable to assess- intubated and sedated PSYCHIATRIC: unable to assess SKIN: No obvious rash, lesion, or ulcer.  LABORATORY PANEL:  Female CBC Recent Labs  Lab 11/15/18 0547  WBC 9.8  HGB 8.7*  HCT 26.0*  PLT 303   ------------------------------------------------------------------------------------------------------------------ Chemistries  Recent Labs  Lab 11/15/18 0547  NA 137  K 3.1*  CL 104  CO2 23  GLUCOSE 161*  BUN 10  CREATININE 0.44  CALCIUM 7.9*  MG 1.9  AST 29   ALT 35  ALKPHOS 50  BILITOT 0.8   RADIOLOGY:  No results found. ASSESSMENT AND PLAN:   Acute hypoxic respiratory failure and moderate ARDS as well as polypharmacy withdrawal -Extubated 11/13/2018 -On Precedex drip Ativan as needed for agitation -COVID testing is negative -  So far no growth on respiratory cultures.  MRSA PCR negative  Sepsis- unclear etiology. No signs of UTI or pneumonia. -ID consulted- recommended MRI spine which was done on 11/06/2018 with no discitis.  -Received broad spectrum antibiotics per infectious disease specialist.  Appreciate input.   Patient was initially placed on broad-spectrum IV antibiotics with levofloxacin, Flagyl and vancomycin patient reevaluated by infectious disease specialist and vancomycin discontinued on 11/09/2018  Acute gastroenteritis- resolved. No additional episodes of vomiting.  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 Management plans intensivist discussed with the family and they are in agreement.  CODE STATUS: Full Code  TOTAL TIME TAKING CARE OF THIS PATIENT: 32 minutes.   More than 50% of the time was spent in counseling/coordination of care: YES  POSSIBLE D/C IN ? DAYS, DEPENDING ON CLINICAL CONDITION.   [name] M.D on 11/15/2018 at 2:17 PM  Between 7am to 6pm - Pager - [phone]  After 6pm go to [email or web address] - Proofreader  [facility]  Office  [phone]  CC: Primary care physician; Katheren Shams  Note: This dictation was prepared with Dragon dictation along with smaller phrase technology. Any transcriptional errors that result from this process are unintentional.

## 2018-11-15 NOTE — Progress Notes (Signed)
Patients daughter called to check in and reviewed that she is stable but still has some agitation and confusion. Currently she is resting and other than when she got upset from having bath and repositioning she has at times been alert. She was appreciative for the review and updates with no further questions.

## 2018-11-16 LAB — BASIC METABOLIC PANEL
Anion gap: 8 (ref 5–15)
BUN: 7 mg/dL — ABNORMAL LOW (ref 8–23)
CO2: 24 mmol/L (ref 22–32)
Calcium: 8.2 mg/dL — ABNORMAL LOW (ref 8.9–10.3)
Chloride: 105 mmol/L (ref 98–111)
Creatinine, Ser: <0.3 mg/dL — ABNORMAL LOW (ref 0.44–1.00)
Glucose, Bld: 143 mg/dL — ABNORMAL HIGH (ref 70–99)
Potassium: 3.3 mmol/L — ABNORMAL LOW (ref 3.5–5.1)
Sodium: 137 mmol/L (ref 135–145)

## 2018-11-16 LAB — MAGNESIUM: Magnesium: 1.9 mg/dL (ref 1.7–2.4)

## 2018-11-16 LAB — PROCALCITONIN: Procalcitonin: 0.23 ng/mL

## 2018-11-16 LAB — PHOSPHORUS: Phosphorus: 3.5 mg/dL (ref 2.5–4.6)

## 2018-11-16 LAB — GLUCOSE, CAPILLARY
Glucose-Capillary: 132 mg/dL — ABNORMAL HIGH (ref 70–99)
Glucose-Capillary: 136 mg/dL — ABNORMAL HIGH (ref 70–99)
Glucose-Capillary: 96 mg/dL (ref 70–99)

## 2018-11-16 MED ORDER — BUDESONIDE 0.25 MG/2ML IN SUSP
0.2500 mg | Freq: Two times a day (BID) | RESPIRATORY_TRACT | Status: DC
  Administered 2018-11-16 – 2018-11-22 (×11): 0.25 mg via RESPIRATORY_TRACT
  Filled 2018-11-16 (×12): qty 2

## 2018-11-16 MED ORDER — IPRATROPIUM-ALBUTEROL 0.5-2.5 (3) MG/3ML IN SOLN
RESPIRATORY_TRACT | Status: AC
  Filled 2018-11-16: qty 3

## 2018-11-16 MED ORDER — POTASSIUM CHLORIDE 10 MEQ/50ML IV SOLN
10.0000 meq | INTRAVENOUS | Status: DC
  Filled 2018-11-16 (×3): qty 50

## 2018-11-16 MED ORDER — POTASSIUM CHLORIDE 2 MEQ/ML IV SOLN
INTRAVENOUS | Status: DC
  Administered 2018-11-16 – 2018-11-19 (×5): via INTRAVENOUS
  Filled 2018-11-16 (×6): qty 1000

## 2018-11-16 MED ORDER — MAGNESIUM SULFATE IN D5W 1-5 GM/100ML-% IV SOLN
1.0000 g | Freq: Once | INTRAVENOUS | Status: DC
  Filled 2018-11-16: qty 100

## 2018-11-16 NOTE — Evaluation (Signed)
Physical Therapy Evaluation Patient Details Name: Cindy Morrison MRN: [id number] DOB: [birth year] Today's Date: 11/16/2018   History of Present Illness  Pt is a 63 y.o. female presenting to hospital 11/01/18 with N/V, midline back pain, and cough.  Pt admitted with acute hypoxic respiratory failure, acute gastroenteritis, cardiac and renal failure, and sepsis.  Pt intubated 11/01/18; extubated and re-intubated 11/04/18, and extubated 11/13/18.  Agitation noted during hospitalization.  PMH includes anxiety, CA, psoriasis, R biceps tenodesis and RCR 12/18/17; (+) smoking.  Clinical Impression  Prior to hospital admission, per chart review pt was independent and lives with her husband.  Pt unable to state name or DOB (minimal words spoken entire session); impulsivity noted; inconsistent with following one step commands.  Currently pt is 2 assist with bed mobility and for sitting edge of bed.  In sitting, pt leaning/pushing forward requiring 2 assist to maintain upright (pt assisted back to laying down in bed each trial d/t safety concerns).  Pt appeared to fatigue sitting edge of bed (x3 trials).  Currently did not appear safe to attempt to stand or transfer pt to chair.   Pt would benefit from skilled PT to address noted impairments and functional limitations (see below for any additional details).  Upon hospital discharge, recommend pt discharge to [facility].    Follow Up Recommendations SNF    Equipment Recommendations  (TBD at next facility)    Recommendations for Other Services OT consult     Precautions / Restrictions Precautions Precautions: Fall Restrictions Weight Bearing Restrictions: No      Mobility  Bed Mobility Overal bed mobility: Needs Assistance Bed Mobility: Supine to Sit     Supine to sit: Min assist;Mod assist;+2 for physical assistance;+2 for safety/equipment     General bed mobility comments: assist for trunk and B LE's semi-supine to/from sit  Transfers                  General transfer comment: Deferred d/t pt's difficulty following commands and safety concerns  Ambulation/Gait             General Gait Details: Deferred d/t pt's difficulty following commands and safety concerns  Stairs            Wheelchair Mobility    Modified Rankin (Stroke Patients Only)       Balance Overall balance assessment: Needs assistance Sitting-balance support: Bilateral upper extremity supported;Feet unsupported Sitting balance-Leahy Scale: Poor Sitting balance - Comments: pt leaning/pushing forward in sitting requiring 2 assist to maintain upright for safety                                     Pertinent Vitals/Pain Pain Assessment: Faces Faces Pain Scale: No hurt Pain Intervention(s): Limited activity within patient's tolerance;Monitored during session;Repositioned    Home Living Family/patient expects to be discharged to:: Private residence Living Arrangements: Spouse/significant other               Additional Comments: Pt unable to answer home living situation questions    Prior Function           Comments: Per chart review pt appeared to be independent with ambulation prior to hospitalization.     Hand Dominance        Extremity/Trunk Assessment   Upper Extremity Assessment Upper Extremity Assessment: RUE deficits/detail;LUE deficits/detail;Difficult to assess due to impaired cognition RUE Deficits / Details: pt did not initiate any  shoulder flexion AROM (PROM WFL); at least 3/5 AROM elbow flexion; fair hand grip strength LUE Deficits / Details: pt did not initiate any shoulder flexion or elbow flexion AROM; fair hand grip strength (slightly weaker than R though)    Lower Extremity Assessment Lower Extremity Assessment: RLE deficits/detail;LLE deficits/detail;Difficult to assess due to impaired cognition RLE Deficits / Details: at least 2+/5 hip flexion  LLE Deficits / Details: at least 2+/5 hip flexion      Cervical / Trunk Assessment Cervical / Trunk Assessment: (forward head and shoulders)  Communication   Communication: Expressive difficulties;Receptive difficulties  Cognition Arousal/Alertness: Awake/alert Behavior During Therapy: Restless;Impulsive;Flat affect                                   General Comments: Pt unable to state name or DOB.      General Comments   Nursing cleared pt for participation in physical therapy.  Pt appearing agreeable to PT session.  Sitter present entire session.    Exercises     Assessment/Plan    PT Assessment Patient needs continued PT services  PT Problem List Decreased strength;Decreased activity tolerance;Decreased balance;Decreased mobility;Decreased cognition;Decreased knowledge of use of DME;Decreased safety awareness;Decreased knowledge of precautions       PT Treatment Interventions DME instruction;Gait training;Stair training;Functional mobility training;Therapeutic activities;Therapeutic exercise;Balance training;Patient/family education    PT Goals (Current goals can be found in the Care Plan section)  Acute Rehab PT Goals Patient Stated Goal: pt did not state any goals when asked PT Goal Formulation: Patient unable to participate in goal setting Time For Goal Achievement: 11/30/18 Potential to Achieve Goals: Fair    Frequency Min 2X/week   Barriers to discharge Decreased caregiver support      Co-evaluation               AM-PAC PT "6 Clicks" Mobility  Outcome Measure Help needed turning from your back to your side while in a flat bed without using bedrails?: A Lot Help needed moving from lying on your back to sitting on the side of a flat bed without using bedrails?: Total Help needed moving to and from a bed to a chair (including a wheelchair)?: Total Help needed standing up from a chair using your arms (e.g., wheelchair or bedside chair)?: Total Help needed to walk in hospital room?:  Total Help needed climbing 3-5 steps with a railing? : Total 6 Click Score: 7    End of Session Equipment Utilized During Treatment: Oxygen(2 L O2 via nasal cannula) Activity Tolerance: Patient limited by fatigue Patient left: in bed;with call bell/phone within reach;with nursing/sitter in room(Sitter in room) Nurse Communication: Mobility status;Precautions PT Visit Diagnosis: Other abnormalities of gait and mobility (R26.89);Muscle weakness (generalized) (M62.81);Difficulty in walking, not elsewhere classified (R26.2)    Time: 3903-0092 PT Time Calculation (min) (ACUTE ONLY): 25 min   Charges:   PT Evaluation $PT Eval Low Complexity: 1 Low PT Treatments $Therapeutic Activity: 8-22 mins       Leitha Bleak, PT 11/16/18, 4:27 PM [phone]

## 2018-11-16 NOTE — Progress Notes (Signed)
Pharmacy Electrolyte Monitoring Consult:  Pharmacy consulted to assist in monitoring and replacing electrolytes in this 63 y.o. female admitted on 11/01/2018 with Emesis   Labs:  Sodium (mmol/L)  Date Value  11/16/2018 137  03/16/2014 136   Potassium (mmol/L)  Date Value  11/16/2018 3.3 (L)  03/16/2014 3.8   Magnesium (mg/dL)  Date Value  11/15/2018 1.9   Phosphorus (mg/dL)  Date Value  11/12/2018 3.8   Calcium (mg/dL)  Date Value  11/16/2018 8.2 (L)   Calcium, Total (mg/dL)  Date Value  03/16/2014 9.0   Albumin (g/dL)  Date Value  11/15/2018 2.5 (L)  03/16/2014 3.8   Corrected Calcium: 9.1  Assessment/Plan: MIVF transitioned to D5LR/15mEq Potassium at 26mL/hr. This will provide 48 mEq of potassium replacement. Patient received approximately 53 mEq of potassium replacement on 4/12 (77mEq for MIVF and 71mEq from replacement).   Will defer further replacement at this time. Will obtain BMP with am labs.   Pharmacy will continue to monitor and adjust per consult.    Sharda Keddy L 11/16/2018 11:43 AM

## 2018-11-16 NOTE — Progress Notes (Signed)
She is much improved today.  More alert and conversant.  Oriented to place and person.  No respiratory distress.  Plan for today is to get her mobilized.  I would like to keep her off of dexmedetomidine if possible.  PT evaluation ordered.  Merton Border, MD PCCM service Mobile [phone] Pager [phone] 11/16/2018 1:14 PM

## 2018-11-16 NOTE — Evaluation (Addendum)
Clinical/Bedside Swallow Evaluation Patient Details  Name: Cindy Morrison MRN: [id number] Date of Birth: Apr 21, 1956  Today's Date: 11/16/2018 Time: SLP Start Time (ACUTE ONLY): 0900 SLP Stop Time (ACUTE ONLY): 1000 SLP Time Calculation (min) (ACUTE ONLY): 60 min  Past Medical History:  Past Medical History:  Diagnosis Date  . Anxiety   . Cancer ([place])    choroid melanoma left eye  . GERD (gastroesophageal reflux disease)   . Psoriasis    Past Surgical History:  Past Surgical History:  Procedure Laterality Date  . ABDOMINAL HYSTERECTOMY    . BICEPT TENODESIS Right 12/18/2017   Procedure: BICEPS TENODESIS;  Surgeon: Leim Fabry, MD;  Location: [facility] ORS;  Service: Orthopedics;  Laterality: Right;  . BRAIN TUMOR EXCISION  2007  . COLONOSCOPY    . ESOPHAGOGASTRODUODENOSCOPY    . EYE SURGERY  2012  . MOHS SURGERY  2018  . SHOULDER ARTHROSCOPY WITH OPEN ROTATOR CUFF REPAIR Right 12/18/2017   Procedure: SHOULDER ARTHROSCOPY WITH OPEN ROTATOR CUFF REPAIR,DISTAL CLAVICLE EXCISION,SUBACROMIAL DECOMPRESSION;  Surgeon: Leim Fabry, MD;  Location: [facility] ORS;  Service: Orthopedics;  Laterality: Right;   HPI:  Pt is a 63 y.o. female with a known history of anxiety, hx melanoma, GERD who presented to the ED with intractable nausea, vomiting, and malaise over the last couple of days.  She also endorsed some midline low back pain.  She states she is "just not feeling well".  She denies any abdominal pain, diarrhea, or constipation.  No chest pain, shortness of breath, fevers, chills.  Found to be hypoxic in the ED.  In ICU, pt became more agitated and hypoxic and required emergent intubation and placed on contact precautions high risk.  Nasopharyngeal swab for COVID sent and negative.  Patient also became febrile on 11/02/2018.  WBC went from 16.8 on 11/02/2018 to 4.6 on 11/03/2018 along with lymphopenia.  Pt agitated when attempting to wean from vent.  During aggitation with multiple attempts to self  extubate, pt did extubated then required re-intubation post aspiration of copious secretions.  Pt was finally extubated on 11/13/2018.  She is tolerating extubation well today but remains significantly encephalopathic and requiring medications for anxiety/agitation.     Assessment / Plan / Recommendation Clinical Impression  Pt appears to present adequate toleration of single ice chips during this brief assessment w/ no overt s/s of aspiration w/ these trials. Unfortunately, when pt was given 1 trial each of applesauce and Nectar liquids (juice) via TSP, pt gave an immediate negative reaction to the boluses then expectorated each of them. Pt exhibited hoarse coughing w/ poor Vocal Cord contact - ineffective cough for airway protection. Pt demonstrated exaggerated body movements and reactions to the po trials of increased texture and took minutes to calm - suspect d/t pt's declined Cognitive/Mental status. No further trials were given. Continued to give pt trials of ice chips w/ instructions to masticate; swallow hard. Pt demonstrated fair oral phase bolus control w/ the ice chip trials c/b adequate labial closure, effective mastication, timely swallow. Unable to determine oral phase management/control w/ the other trials attempted d/t pt's immediate negative reactions. Oral motor exam was grossly WFL; during oral care, pt exhibited lingual retraction/bunching often pushing swab out. Due to pt's presentation and increased risk for aspiration/choking, no oral diet is recommended at this time. Instead, recommend frequent ice chip trials to promote oropharyngeal phase swallowing and bolus management. Recommend aspiration precautions; medication via alternative means currently. ST services will f/u w/ continued tx/po trials as pt's  Cognitive/Mental status improves.  SLP Visit Diagnosis: Dysphagia, oropharyngeal phase (R13.12)    Aspiration Risk  Moderate aspiration risk;Risk for inadequate nutrition/hydration     Diet Recommendation  NPO w/ trials of ice chips; frequent oral care for hygiene and stimulation of swallowing  Medication Administration: Via alternative means    Other  Recommendations Recommended Consults: (Dietician f/u) Oral Care Recommendations: Oral care QID;Staff/trained caregiver to provide oral care Other Recommendations: (TBD)   Follow up Recommendations Skilled Nursing facility(TBD)      Frequency and Duration min 3x week  2 weeks       Prognosis Prognosis for Safe Diet Advancement: Fair Barriers to Reach Goals: (mental status decline)      Swallow Study   General Date of Onset: 11/01/18 HPI: Pt is a 63 y.o. female with a known history of anxiety, hx melanoma, GERD who presented to the ED with intractable nausea, vomiting, and malaise over the last couple of days.  She also endorsed some midline low back pain.  She states she is "just not feeling well".  She denies any abdominal pain, diarrhea, or constipation.  No chest pain, shortness of breath, fevers, chills.  Found to be hypoxic in the ED.  In ICU, pt became more agitated and hypoxic and required emergent intubation and placed on contact precautions high risk.  Nasopharyngeal swab for COVID sent and negative.  Patient also became febrile on 11/02/2018.  WBC went from 16.8 on 11/02/2018 to 4.6 on 11/03/2018 along with lymphopenia.  Pt agitated when attempting to wean from vent.  During aggitation with multiple attempts to self extubate, pt did extubated then required re-intubation post aspiration of copious secretions.  Pt was finally extubated on 11/13/2018.  She is tolerating extubation well today but remains significantly encephalopathic and requiring medications for anxiety/agitation.   Type of Study: Bedside Swallow Evaluation Previous Swallow Assessment: none reported Diet Prior to this Study: NPO(NG tube for TFs when orally intubated) Temperature Spikes Noted: No(wbc 9.8) Respiratory Status: Nasal  cannula(2liters) History of Recent Intubation: Yes Length of Intubations (days): 12 days Date extubated: 11/13/18 Behavior/Cognition: Confused;Agitated;Distractible;Requires cueing;Doesn't follow directions(Awake) Oral Cavity Assessment: Dry Oral Care Completed by SLP: Recent completion by staff Oral Cavity - Dentition: Adequate natural dentition Vision: (n/a) Self-Feeding Abilities: Total assist(confused) Patient Positioning: Upright in bed(needed positioning) Baseline Vocal Quality: Hoarse;Breathy;Low vocal intensity Volitional Cough: Weak(poor VS contact) Volitional Swallow: Unable to elicit    Oral/Motor/Sensory Function Overall Oral Motor/Sensory Function: Within functional limits   Ice Chips Ice chips: Impaired Presentation: Spoon(15 trials) Oral Phase Impairments: Poor awareness of bolus(sometimes masticated vs orally held) Oral Phase Functional Implications: Prolonged oral transit Pharyngeal Phase Impairments: (none)   Thin Liquid Thin Liquid: Not tested    Nectar Thick Nectar Thick Liquid: Impaired Presentation: Spoon(1 trial) Oral Phase Impairments: Reduced lingual movement/coordination;Poor awareness of bolus;Reduced labial seal(immediate negative reaction to bolus) Oral phase functional implications: (immediate negative reaction to bolus; expectorated it) Pharyngeal Phase Impairments: Cough - Immediate(but unsure if she swallowed it)   Honey Thick Honey Thick Liquid: Not tested   Puree Puree: Impaired Presentation: Spoon(1 trial) Oral Phase Impairments: Reduced lingual movement/coordination;Reduced labial seal;Poor awareness of bolus(immediate negative reaction to bolus) Oral Phase Functional Implications: (immediate negative reaction to bolus; expectorated it) Pharyngeal Phase Impairments: Cough - Immediate(but unsure she swallowed it)   Solid     Solid: Not tested       Orinda Kenner, MS, CCC-SLP [facility] 11/16/2018,11:59 AM

## 2018-11-16 NOTE — Progress Notes (Signed)
[facility] [place] NAME: Lilianna Sirois    MR#:  [id number]  DATE OF BIRTH:  Nel 18, 1957  SUBJECTIVE:  CHIEF COMPLAINT:   Chief Complaint  Patient presents with  . Emesis     Patient got extubated 11/13/18, patient started communicating in small words More alert with intermittent episodes of agitation.  She feels better she states Still requiring sitter  REVIEW OF SYSTEMS:  Review of Systems  Cardiovascular: Positive for PND.   Unobtainable due to patient being on the vent during my exam  DRUG ALLERGIES:   Allergies  Allergen Reactions  . Erythromycin Base Swelling    Erythromycin ophthalmic ointment   VITALS:  Blood pressure (!) 161/75, pulse (!) 101, temperature 98 F (36.7 C), temperature source Axillary, resp. rate (!) 33, height 5\' 6"  (1.676 m), weight 81.2 kg, SpO2 92 %. PHYSICAL EXAMINATION:  Physical Exam  GENERAL:Patient sedated on the vent.  Requiring four-point restraints for patient safety  EYES: Pupils equal, round, reactive to light and accommodation. No scleral icterus. Extraocular muscles intact.  HEENT: Head atraumatic, normocephalic. Oropharynx and nasopharynx clear.ETT in place. NECK: Supple, no jugular venous distention. No thyroid enlargement, no tenderness.  LUNGS: +diffuse rhonchi, no wheezing, rales,or crepitation. No use of accessory muscles of respiration.  CARDIOVASCULAR: RRR,S1, S2 normal. No murmurs, rubs, or gallops.  ABDOMEN: Soft, nontender, nondistended. Bowel sounds present.  EXTREMITIES: No pedal edema, cyanosis, or clubbing.  NEUROLOGIC: unable to assess- intubated and sedated PSYCHIATRIC: unable to assess SKIN: No obvious rash, lesion, or ulcer.  LABORATORY PANEL:  Female CBC Recent Labs  Lab 11/15/18 0547  WBC 9.8  HGB 8.7*  HCT 26.0*  PLT 303   ------------------------------------------------------------------------------------------------------------------ Chemistries   Recent Labs  Lab 11/15/18 0547 11/16/18 0627  NA 137 137  K 3.1* 3.3*  CL 104 105  CO2 23 24  GLUCOSE 161* 143*  BUN 10 7*  CREATININE 0.44 <0.30*  CALCIUM 7.9* 8.2*  MG 1.9 1.9  AST 29  --   ALT 35  --   ALKPHOS 50  --   BILITOT 0.8  --    RADIOLOGY:  No results found. ASSESSMENT AND PLAN:   Acute hypoxic respiratory failure and moderate ARDS as well as polypharmacy withdrawal -Slow clinical progress -Extubated 11/13/2018 -OFF  Precedex drip  Off BiPAP Ativan as needed for agitation -COVID testing is negative -  So far no growth on respiratory cultures.  MRSA PCR negative -Sitter for safety  Sepsis- unclear etiology. No signs of UTI or pneumonia. -ID consulted- recommended MRI spine which was done on 11/06/2018 with no discitis.  -Received broad spectrum antibiotics per infectious disease specialist.  Appreciate input.   Patient was initially placed on broad-spectrum IV antibiotics with levofloxacin, Flagyl and vancomycin patient reevaluated by infectious disease specialist and vancomycin discontinued on 11/09/2018.  Blood cultures are negative.  MRSA PCR negative respiratory culture with no growth  Acute gastroenteritis- resolved. No additional episodes of vomiting.  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 Management plans intensivist discussed with the family and they are in agreement.  CODE STATUS: Full Code  TOTAL TIME TAKING CARE OF THIS PATIENT: 32 minutes.   More than 50% of the time was spent in counseling/coordination of care: YES  POSSIBLE D/C IN ? DAYS, DEPENDING ON CLINICAL CONDITION.   [name] M.D on 11/16/2018 at 4:29 PM  Between  7am to 6pm - Pager - [phone]  After 6pm go to [email or web address] - Proofreader  [facility]  Office  [phone]  CC: Primary care physician; Katheren Shams   Note: This dictation was prepared with Dragon dictation along with smaller phrase technology. Any transcriptional errors that result from this process are unintentional.

## 2018-11-16 NOTE — Progress Notes (Signed)
Nutrition Follow-up  RD working remotely.  DOCUMENTATION CODES:   Not applicable  INTERVENTION:  Once diet able to be advanced patient will benefit from ONS. RD will continue to monitor.  NUTRITION DIAGNOSIS:   Inadequate oral intake related to inability to eat as evidenced by NPO status.  Ongoing.  GOAL:   Provide needs based on ASPEN/SCCM guidelines  Not met - pending diet progression once cleared by SLP.  MONITOR:   Vent status, Labs, Weight trends, TF tolerance, I & O's  REASON FOR ASSESSMENT:   Ventilator, Consult Enteral/tube feeding initiation and management  ASSESSMENT:   63 year old female with PMHx of GERD, psoriasis, anxiety, COPD admitted with severe acute hypoxic and hypercapnic respiratory failure from PNA and septic shock requiring intubation on 3/29, severe COPD exacerbation, undergoing r/o for COVID-19. Of note patient had intractable N/V prior to admission.   -Patient self-extubated on 4/1 and was re-intubated later that day. -Patient negative for COVID-19. -Patient had SLP evaluation today. Plan is for NPO with trials of ice chips for now. -Extubated again on 4/10.  Per chart patient more alert and conversant today. Discussed with SLP. Plan is for ice chip trials today with reassessment at later date for diet advancement.  Medications reviewed and include: fentanyl patch, nicotine patch, D5LR with KCl 40 mEq at 50 mL/hr, valproate.  Labs reviewed: CBG 96-165, Potassium 3.3, BUN 7, Creatinine <0.3.  Diet Order:   Diet Order    None     EDUCATION NEEDS:   No education needs have been identified at this time  Skin:  Skin Assessment: Skin Integrity Issues:(MSAD to groin)  Last BM:  11/14/2018 - medium type 6  Height:   Ht Readings from Last 1 Encounters:  11/01/18 5' 6" (1.676 m)   Weight:   Wt Readings from Last 1 Encounters:  11/13/18 81.2 kg   Ideal Body Weight:  59.1 kg  BMI:  Body mass index is 28.89 kg/m.  Estimated  Nutritional Needs:   Kcal:  1630-1905 (MSJ x 1.2-1.4)  Protein:  95-120 grams (1.2-1.5 grams/kg)  Fluid:  2-2.3 L/day (25-30 mL/kg)   Stephens, MS, RD, LDN Office: [phone] Pager: [phone] After Hours/Weekend Pager: [phone]  

## 2018-11-17 LAB — GLUCOSE, CAPILLARY
Glucose-Capillary: 132 mg/dL — ABNORMAL HIGH (ref 70–99)
Glucose-Capillary: 125 mg/dL — ABNORMAL HIGH (ref 70–99)

## 2018-11-17 LAB — BASIC METABOLIC PANEL
Anion gap: 11 (ref 5–15)
BUN: 8 mg/dL (ref 8–23)
CO2: 24 mmol/L (ref 22–32)
Calcium: 8.5 mg/dL — ABNORMAL LOW (ref 8.9–10.3)
Chloride: 102 mmol/L (ref 98–111)
Creatinine, Ser: 0.45 mg/dL (ref 0.44–1.00)
GFR calc Af Amer: >60 mL/min (ref >60)
GFR calc non Af Amer: >60 mL/min (ref >60)
Glucose, Bld: 132 mg/dL — ABNORMAL HIGH (ref 70–99)
Potassium: 2.9 mmol/L — ABNORMAL LOW (ref 3.5–5.1)
Sodium: 137 mmol/L (ref 135–145)

## 2018-11-17 LAB — CBC WITH DIFFERENTIAL/PLATELET
Abs Immature Granulocytes: 0.04 10*3/uL (ref 0.00–0.07)
Basophils Absolute: 0.1 10*3/uL (ref 0.0–0.1)
Basophils Relative: 1 %
Eosinophils Absolute: 0.1 10*3/uL (ref 0.0–0.5)
Eosinophils Relative: 1 %
HCT: 28.7 % — ABNORMAL LOW (ref 36.0–46.0)
Hemoglobin: 9.7 g/dL — ABNORMAL LOW (ref 12.0–15.0)
Immature Granulocytes: 0 %
Lymphocytes Relative: 18 %
Lymphs Abs: 1.6 10*3/uL (ref 0.7–4.0)
MCH: 31 pg (ref 26.0–34.0)
MCHC: 33.8 g/dL (ref 30.0–36.0)
MCV: 91.7 fL (ref 80.0–100.0)
Monocytes Absolute: 1.1 10*3/uL — ABNORMAL HIGH (ref 0.1–1.0)
Monocytes Relative: 12 %
Neutro Abs: 6.4 10*3/uL (ref 1.7–7.7)
Neutrophils Relative %: 68 %
Platelets: 454 10*3/uL — ABNORMAL HIGH (ref 150–400)
RBC: 3.13 MIL/uL — ABNORMAL LOW (ref 3.87–5.11)
RDW: 12.7 % (ref 11.5–15.5)
WBC: 9.3 10*3/uL (ref 4.0–10.5)
nRBC: 0 % (ref 0.0–0.2)

## 2018-11-17 LAB — MAGNESIUM: Magnesium: 1.8 mg/dL (ref 1.7–2.4)

## 2018-11-17 MED ORDER — POTASSIUM CHLORIDE 10 MEQ/50ML IV SOLN
10.0000 meq | INTRAVENOUS | Status: AC
  Administered 2018-11-17 (×6): 10 meq via INTRAVENOUS
  Filled 2018-11-17 (×6): qty 50

## 2018-11-17 MED ORDER — BUPROPION HCL 75 MG PO TABS
75.0000 mg | ORAL_TABLET | Freq: Two times a day (BID) | ORAL | Status: DC
  Administered 2018-11-18 – 2018-11-20 (×4): 75 mg via ORAL
  Filled 2018-11-17 (×10): qty 1

## 2018-11-17 MED ORDER — MAGNESIUM SULFATE 2 GM/50ML IV SOLN
2.0000 g | Freq: Once | INTRAVENOUS | Status: AC
  Administered 2018-11-17: 2 g via INTRAVENOUS
  Filled 2018-11-17: qty 50

## 2018-11-17 MED ORDER — VENLAFAXINE HCL ER 75 MG PO CP24
150.0000 mg | ORAL_CAPSULE | Freq: Every evening | ORAL | Status: DC
  Administered 2018-11-18 – 2018-11-19 (×2): 150 mg via ORAL
  Filled 2018-11-17 (×2): qty 2

## 2018-11-17 NOTE — TOC Progression Note (Addendum)
Transition of Care [facility]) - Progression Note    Patient Details  Name: Cindy Morrison MRN: [id number] Date of Birth: [birth year]  [facility]) CM/SW Contact  Ross Ludwig, [place] Phone Number: 11/17/2018, 12:38 PM  Clinical Narrative:     CSW received phone call from insurance company that [facility] authorization was never started due to not having any beds available, PT is now recommending SNF for short term rehab.  CSW began bed search in [place] and [place].  [facility] number has been received, it is [phone] E good through 12-17-18.   Expected Discharge Plan: Long Term Acute Care (LTAC) Barriers to Discharge: Continued Medical Work up(intubated, sedated- failing weaning trials)  Expected Discharge Plan and Services Expected Discharge Plan: [place] (LTAC)   Discharge Planning Services: CM Consult   Living arrangements for the past 2 months: Single Family Home                           Social Determinants of Health (SDOH) Interventions    Readmission Risk Interventions No flowsheet data found.

## 2018-11-17 NOTE — Progress Notes (Addendum)
SLP Cancellation Note  Patient Details Name: Cindy Morrison MRN: [id number] DOB: 07-23-1956   Cancelled treatment:       Reason Eval/Treat Not Completed: Fatigue/lethargy limiting ability to participate(chart reviewed; attempted to awaken pt this morning ). Pt did not arouse to open eyes or verbalize to engage when given max verbal/tactile stimulation by SLP.  Will re-attempt this afternoon. Pt remains NPO. Recommend oral care when awake. NSG updated.     Orinda Kenner, MS, CCC-SLP Giovana Faciane 11/17/2018, 9:37 AM

## 2018-11-17 NOTE — Progress Notes (Signed)
Pharmacy Electrolyte Monitoring Consult:  Pharmacy consulted to assist in monitoring and replacing electrolytes in this 63 y.o. female admitted on 11/01/2018 with Emesis   Labs:  Sodium (mmol/L)  Date Value  11/17/2018 137  03/16/2014 136   Potassium (mmol/L)  Date Value  11/17/2018 2.9 (L)  03/16/2014 3.8   Magnesium (mg/dL)  Date Value  11/17/2018 1.8   Phosphorus (mg/dL)  Date Value  11/16/2018 3.5   Calcium (mg/dL)  Date Value  11/17/2018 8.5 (L)   Calcium, Total (mg/dL)  Date Value  03/16/2014 9.0   Albumin (g/dL)  Date Value  11/15/2018 2.5 (L)  03/16/2014 3.8   Corrected Calcium: 9.1  Assessment/Plan: MIVF transitioned to D5LR/11mEq Potassium at 27mL/hr. This will provide 48 mEq of potassium replacement. Per Nurse Practitioner, Patient has been order KCl 10 mEq IV(central line) x 6 this morning.  Will defer further replacement at this time. Will obtain BMP with am labs.   Pharmacy will continue to monitor and adjust per consult.    Pearla Dubonnet, PharmD Clinical Pharmacist 11/17/2018 7:15 AM

## 2018-11-17 NOTE — Progress Notes (Signed)
Patient transferred to room 250 from ICU.  Report received from [facility]

## 2018-11-17 NOTE — Progress Notes (Signed)
[place] at [place] NAME: Cindy Morrison    MR#:  [id number]  DATE OF BIRTH:  March 08, 1956  SUBJECTIVE:   patient opens her eyes and nods her head but does not speak  REVIEW OF SYSTEMS:    Able to obtain due to current mental status  Tolerating Diet: npo      DRUG ALLERGIES:   Allergies  Allergen Reactions  . Erythromycin Base Swelling    Erythromycin ophthalmic ointment    VITALS:  Blood pressure (!) 157/90, pulse 95, temperature 98.3 F (36.8 C), temperature source Oral, resp. rate 20, height 5\' 6"  (1.676 m), weight 81.2 kg, SpO2 100 %.  PHYSICAL EXAMINATION:  Constitutional: Appears well-developed and well-nourished. No distress. HENT: Normocephalic. .   Eyes: Conjunctivae and EOM are normal. PERRLA, no scleral icterus.  Neck: Normal ROM. Neck supple. No JVD. No tracheal deviation. CVS: RRR, S1/S2 +, no murmurs, no gallops, no carotid bruit.  Pulmonary: Creased breath sounds throughout lung fields no stridor, rhonchi, wheezes, rales.  Abdominal: Soft. BS +,  no distension, tenderness, rebound or guarding.  Musculoskeletal: Normal range of motion. No edema and no tenderness.  Neuro: No focal deficit.  Patient arouses and opens her eyes but does not speak  skin: Skin is warm and dry. No rash noted.      LABORATORY PANEL:   CBC Recent Labs  Lab 11/17/18 0507  WBC 9.3  HGB 9.7*  HCT 28.7*  PLT 454*   ------------------------------------------------------------------------------------------------------------------  Chemistries  Recent Labs  Lab 11/15/18 0547  11/17/18 0507  NA 137   < > 137  K 3.1*   < > 2.9*  CL 104   < > 102  CO2 23   < > 24  GLUCOSE 161*   < > 132*  BUN 10   < > 8  CREATININE 0.44   < > 0.45  CALCIUM 7.9*   < > 8.5*  MG 1.9   < > 1.8  AST 29  --   --   ALT 35  --   --   ALKPHOS 50  --   --   BILITOT 0.8  --   --    < > = values in this interval not displayed.    ------------------------------------------------------------------------------------------------------------------  Cardiac Enzymes No results for input(s): TROPONINI in the last 168 hours. ------------------------------------------------------------------------------------------------------------------  RADIOLOGY:  No results found.   ASSESSMENT AND PLAN:   62 year old female with a history of anxiety who presented to the ER due to intractable nausea, vomiting and malaise.  She was found to have new bigeminy while in the emergency room and hypotension.  1.  Hypovolemic/septic shock: Patient was in the ICU.  Vasopressors were used to keep map greater than 65.  She was evaluated by intensivist.  Aggressive fluid resuscitation was ordered. This is resolved.  2.  Sepsis from aspiration pneumonia: Sepsis is resolved. Patient evaluated by ID.  She was placed on broad-spectrum antibiotics for aspiration pneumonia.  Blood cultures have remained negative and MRSA PCR was negative as well.  She was ruled out for COVID-19.   3.  Severe acute hypoxic and hypercapnic respiratory failure from aspiration pneumonia and septic shock: Patient was intubated on the day of admission and subsequently extubated on April 10.  She was then placed on BiPAP and is now on nasal cannula. 4.  Nausea, vomiting and malaise: This was patient's initial complaint and was due to acute gastroenteritis and has subsequently resolved.  5.  Acute encephalopathy: This may be due to ICU delirium and/or sedatives given while patient was intubated. Discontinue Depakote and fentanyl patch. Discussed with Dr. Jamal Collin this morning. Patient does not need sitter and this will be discontinued. Consider head CT if patient's symptoms have not improved over the next 24 hours.   6 History of malignant melanoma of the eye   7.  Nutrition: Patient is currently n.p.o. due to mental status Follow-up on speech recommendations.  8  History of anxiety: Restart Wellbutrin and Effexor when patient able to tolerate p.o.  8.  Hypokalemia: This is been repleted by pharmacy. Levels 1.8 Management plans discussed with nursing  CODE STATUS: full  TOTAL TIME TAKING CARE OF THIS PATIENT: 27 minutes.     POSSIBLE D/C ??, DEPENDING ON CLINICAL CONDITION.   Bettey Costa M.D on 11/17/2018 at 11:40 AM  Between 7am to 6pm - Pager - [phone] After 6pm go to [email or web address] - password [email or web address] [place] [facility]  Office  [phone]  CC: Primary care physician; Katheren Shams  Note: This dictation was prepared with Dragon dictation along with smaller phrase technology. Any transcriptional errors that result from this process are unintentional.

## 2018-11-17 NOTE — Progress Notes (Signed)
PT Cancellation Note  Patient Details Name: Cindy Morrison MRN: [id number] DOB: Oct 25, 1955   Cancelled Treatment:    Reason Eval/Treat Not Completed: Medical issues which prohibited therapy.  Chart reviewed.  Pt's potassium noted to be decreased to 2.9 this morning.  Per PT guidelines for low potassium, will hold PT at this time and re-attempt PT treatment at a later date/time as medically appropriate.  Leitha Bleak, PT 11/17/18, 9:06 AM [phone]

## 2018-11-17 NOTE — NC FL2 (Signed)
[place] LEVEL OF CARE SCREENING TOOL     IDENTIFICATION  Patient Name: Cindy Morrison Birthdate: 1956/01/14 Sex: female Admission Date (Current Location): 11/01/2018  [place] and [place] Number:  Engineering geologist and Address:  [facility], [address], [place], [place] [address]      Provider Number: [id number]  Attending Physician Name and Address:  Bettey Costa, MD  Relative Name and Phone Number:  Hanifa, Antonetti [phone] or Rance Muir Daughter   [phone]     Current Level of Care: Hospital Recommended Level of Care: [place] Prior Approval Number:    Date Approved/Denied:   PASRR Number: Pending  Discharge Plan: SNF    Current Diagnoses: Patient Active Problem List   Diagnosis Date Noted  . Septic shock ([place])   . Acute respiratory failure with hypoxia ([place])   . Intractable nausea and vomiting 11/01/2018    Orientation RESPIRATION BLADDER Height & Weight     Self, Place  O2(3L) Incontinent Weight: 179 lb 0.2 oz (81.2 kg) Height:  5\' 6"  (167.6 cm)  BEHAVIORAL SYMPTOMS/MOOD NEUROLOGICAL BOWEL NUTRITION STATUS      Incontinent Diet(NPO currently, trying to advance diet)  AMBULATORY STATUS COMMUNICATION OF NEEDS Skin   Limited Assist Verbally Normal                       Personal Care Assistance Level of Assistance  Bathing, Feeding, Dressing Bathing Assistance: Limited assistance Feeding assistance: Limited assistance Dressing Assistance: Limited assistance     Functional Limitations Info  Sight, Hearing, Speech Sight Info: Adequate Hearing Info: Adequate Speech Info: Impaired    SPECIAL CARE FACTORS FREQUENCY  PT (By licensed PT), OT (By licensed OT), Speech therapy     PT Frequency: Minimum 5x a week OT Frequency: Minimum 5x a week     Speech Therapy Frequency: Minimum 2x a week      Contractures Contractures Info: Not present    Additional  Factors Info  Code Status, Allergies Code Status Info: Full Code Allergies Info: Erythromycin ophthalmic ointment           Current Medications (11/17/2018):  This is the current hospital active medication list Current Facility-Administered Medications  Medication Dose Route Frequency Provider Last Rate Last Dose  . acetaminophen (TYLENOL) tablet 650 mg  650 mg Oral Q6H PRN Wilhelmina Mcardle, MD       Or  . acetaminophen (TYLENOL) suppository 650 mg  650 mg Rectal Q6H PRN Wilhelmina Mcardle, MD   650 mg at 11/13/18 1602  . bisacodyl (DULCOLAX) suppository 10 mg  10 mg Rectal Daily PRN Tukov-Yual, Magdalene S, NP   10 mg at 11/09/18 0951  . budesonide (PULMICORT) nebulizer solution 0.25 mg  0.25 mg Nebulization BID Wilhelmina Mcardle, MD   0.25 mg at 11/17/18 0740  . buPROPion [facility]) tablet 75 mg  75 mg Oral BID Mody, Sital, MD      . dextrose 5% lactated ringers 1,000 mL with potassium chloride 40 mEq infusion   Intravenous Continuous Wilhelmina Mcardle, MD 50 mL/hr at 11/17/18 0530    . enoxaparin (LOVENOX) injection 40 mg  40 mg Subcutaneous Q24H Ottie Glazier, MD   40 mg at 11/16/18 2233  . fentaNYL (DURAGESIC) 25 MCG/HR 1 patch  1 patch Transdermal Q72H Wilhelmina Mcardle, MD   1 patch at 11/15/18 1656  . ipratropium-albuterol (DUONEB) 0.5-2.5 (3) MG/3ML nebulizer solution 3 mL  3  mL Nebulization Q6H Tukov-Yual, Magdalene S, NP   3 mL at 11/17/18 0741  . LORazepam (ATIVAN) injection 0.5-1 mg  0.5-1 mg Intravenous Q4H PRN Awilda Bill, NP   1 mg at 11/16/18 0015  . nicotine (NICODERM CQ - dosed in mg/24 hours) patch 14 mg  14 mg Transdermal Daily Wilhelmina Mcardle, MD   14 mg at 11/16/18 1113  . ondansetron (ZOFRAN) injection 4 mg  4 mg Intravenous Q6H PRN Tukov-Yual, Magdalene S, NP      . polyethylene glycol (MIRALAX / GLYCOLAX) packet 17 g  17 g Oral Daily PRN Sela Hua, MD   17 g at 11/10/18 2203  . potassium chloride 10 mEq in 50 mL *CENTRAL LINE* IVPB  10 mEq Intravenous Q1  Hr x 6 Awilda Bill, NP 50 mL/hr at 11/17/18 1046 10 mEq at 11/17/18 1046  . sodium chloride flush (NS) 0.9 % injection 10-40 mL  10-40 mL Intracatheter Q12H Gouru, Aruna, MD   10 mL at 11/17/18 0926  . sodium chloride flush (NS) 0.9 % injection 10-40 mL  10-40 mL Intracatheter PRN Gouru, Aruna, MD   10 mL at 11/17/18 0926  . valproate (DEPACON) 500 mg in dextrose 5 % 50 mL IVPB  500 mg Intravenous Q12H Wilhelmina Mcardle, MD 55 mL/hr at 11/16/18 2233 500 mg at 11/16/18 2233  . venlafaxine XR (EFFEXOR-XR) 24 hr capsule 150 mg  150 mg Oral QPM Bettey Costa, MD         Discharge Medications: Please see discharge summary for a list of discharge medications.  Relevant Imaging Results:  Relevant Lab Results:   Additional Information SSN [id number]  Ross Ludwig, LCSW

## 2018-11-18 LAB — CBC
HCT: 32.2 % — ABNORMAL LOW (ref 36.0–46.0)
Hemoglobin: 10.7 g/dL — ABNORMAL LOW (ref 12.0–15.0)
MCH: 30.7 pg (ref 26.0–34.0)
MCHC: 33.2 g/dL (ref 30.0–36.0)
MCV: 92.3 fL (ref 80.0–100.0)
Platelets: 529 10*3/uL — ABNORMAL HIGH (ref 150–400)
RBC: 3.49 MIL/uL — ABNORMAL LOW (ref 3.87–5.11)
RDW: 13.2 % (ref 11.5–15.5)
WBC: 10.2 10*3/uL (ref 4.0–10.5)
nRBC: 0 % (ref 0.0–0.2)

## 2018-11-18 LAB — BASIC METABOLIC PANEL
Anion gap: 12 (ref 5–15)
BUN: 8 mg/dL (ref 8–23)
CO2: 22 mmol/L (ref 22–32)
Calcium: 8.4 mg/dL — ABNORMAL LOW (ref 8.9–10.3)
Chloride: 103 mmol/L (ref 98–111)
Creatinine, Ser: 0.43 mg/dL — ABNORMAL LOW (ref 0.44–1.00)
GFR calc Af Amer: >60 mL/min (ref >60)
GFR calc non Af Amer: >60 mL/min (ref >60)
Glucose, Bld: 116 mg/dL — ABNORMAL HIGH (ref 70–99)
Potassium: 3.2 mmol/L — ABNORMAL LOW (ref 3.5–5.1)
Sodium: 137 mmol/L (ref 135–145)

## 2018-11-18 LAB — GLUCOSE, CAPILLARY
Glucose-Capillary: 126 mg/dL — ABNORMAL HIGH (ref 70–99)
Glucose-Capillary: 143 mg/dL — ABNORMAL HIGH (ref 70–99)

## 2018-11-18 MED ORDER — BOOST / RESOURCE BREEZE PO LIQD CUSTOM
1.0000 | Freq: Three times a day (TID) | ORAL | Status: DC
  Administered 2018-11-18: 1 via ORAL
  Administered 2018-11-18: 22:00:00 via ORAL

## 2018-11-18 MED ORDER — POTASSIUM CHLORIDE 10 MEQ/50ML IV SOLN
10.0000 meq | INTRAVENOUS | Status: AC
  Administered 2018-11-18 (×4): 10 meq via INTRAVENOUS
  Filled 2018-11-18 (×4): qty 50

## 2018-11-18 MED ORDER — LABETALOL HCL 5 MG/ML IV SOLN
20.0000 mg | INTRAVENOUS | Status: DC | PRN
  Administered 2018-11-18: 20 mg via INTRAVENOUS
  Filled 2018-11-18: qty 4

## 2018-11-18 NOTE — Evaluation (Signed)
Occupational Therapy Evaluation Patient Details Name: Cindy Morrison MRN: [id number] DOB: 1956-06-16 Today's Date: 11/18/2018    History of Present Illness Pt is a 63 y.o. female presenting to hospital 11/01/18 with N/V, midline back pain, and cough.  Pt admitted with acute hypoxic respiratory failure, acute gastroenteritis, cardiac and renal failure, and sepsis.  Pt intubated 11/01/18; extubated and re-intubated 11/04/18, and extubated 11/13/18.  Agitation noted during hospitalization.  PMH includes anxiety, CA, psoriasis, R biceps tenodesis and RCR 12/18/17; (+) smoking.   Clinical Impression   Pt seen for OT/PT co-evaluation(OT)/treat(PT) this date. Pt with decreased cognition at time of evaluation and limited verbalization. No family present to determine baseline. Per chart review pt was independently ambulating prior to admission, but otherwise little history was able to be obtained.  Currently pt demonstrates impairments in cognition, strength, endurance, functional UE use, and activity tolerance. Pt currently requiring max assist for ADL and +2 min/mod assist for bed mobility. Pt would benefit from skilled OT to address noted impairments and functional limitations (see below for any additional details) in order to maximize safety and independence while minimizing falls risk and caregiver burden.  Upon hospital discharge, recommend pt discharge to [place].     Follow Up Recommendations  SNF    Equipment Recommendations  Other (comment)(TBD)    Recommendations for Other Services       Precautions / Restrictions Precautions Precautions: Fall Restrictions Weight Bearing Restrictions: No      Mobility Bed Mobility Overal bed mobility: Needs Assistance Bed Mobility: Supine to Sit;Sit to Supine     Supine to sit: Mod assist;+2 for physical assistance;+2 for safety/equipment;Min assist Sit to supine: Min assist;Mod assist;+2 for physical assistance;+2 for safety/equipment   General bed  mobility comments: assist for trunk and B LE's semi-supine to/from sit  Transfers                 General transfer comment: Deferred d/t pt feeling nautious after coming to sit EOB.     Balance Overall balance assessment: Needs assistance Sitting-balance support: Bilateral upper extremity supported;Feet unsupported Sitting balance-Leahy Scale: Poor                                     ADL either performed or assessed with clinical judgement   ADL Overall ADL's : Needs assistance/impaired Eating/Feeding: Set up;Maximal assistance;Bed level Eating/Feeding Details (indicate cue type and reason): Pt currently on clear liquid diet per SLP. Grooming: Set up;Maximal assistance;Bed level   Upper Body Bathing: Set up;Maximal assistance;Bed level   Lower Body Bathing: Set up;Maximal assistance;Bed level   Upper Body Dressing : Set up;Bed level;Maximal assistance   Lower Body Dressing: Set up;Bed level;Maximal assistance   Toilet Transfer: BSC;Stand-pivot;+2 for physical assistance;+2 for safety/equipment;Maximal assistance   Toileting- Clothing Manipulation and Hygiene: Maximal assistance;Bed level;Set up         General ADL Comments: Pt with generalized weakness and decreased ROM througt BUE/BLE. Max assist for all ADL. +2 assist for bed mobility/transfers at time of evaluation. Has not yet stood EOB.      Vision   Additional Comments: Pt unable to answer questions about vision. Will continue to monitor as cognition improves.      Perception     Praxis      Pertinent Vitals/Pain Pain Assessment: No/denies pain Pain Intervention(s): Repositioned;Monitored during session     Hand Dominance Right   Extremity/Trunk Assessment Upper Extremity  Assessment Upper Extremity Assessment: Generalized weakness;Difficult to assess due to impaired cognition(Pt noted with improved functional UE use. Grossly 2-/5 BUE. Fair grip strength in RUE. Noted arthritic changes  in PIP/DIP joints. )   Lower Extremity Assessment Lower Extremity Assessment: Generalized weakness;Defer to PT evaluation   Cervical / Trunk Assessment Cervical / Trunk Assessment: (forward head and shoulder posture. )   Communication Communication Communication: Expressive difficulties;Receptive difficulties   Cognition Arousal/Alertness: Awake/alert Behavior During Therapy: Flat affect Overall Cognitive Status: No family/caregiver present to determine baseline cognitive functioning                                 General Comments: Pt able to answer simple yes/no questions on this date. Limited verbalizations t/o session. Inconsistently able to follow 1-step commands.    General Comments       Exercises     Shoulder Instructions      Home Living Family/patient expects to be discharged to:: Private residence Living Arrangements: Spouse/significant other                               Additional Comments: Pt unable to answer home living situation questions      Prior Functioning/Environment          Comments: Per chart review pt appeared to be independent with ambulation prior to hospitalization.        OT Problem List: Decreased strength;Impaired balance (sitting and/or standing);Decreased cognition;Decreased range of motion;Decreased safety awareness;Decreased activity tolerance;Decreased coordination;Decreased knowledge of use of DME or AE;Impaired UE functional use      OT Treatment/Interventions: Self-care/ADL training;Manual therapy;Modalities;Therapeutic exercise;Patient/family education;Balance training;Therapeutic activities;Energy conservation;DME and/or AE instruction;Cognitive remediation/compensation    OT Goals(Current goals can be found in the care plan section) Acute Rehab OT Goals Patient Stated Goal: pt did not state any goals when asked ADL Goals Pt Will Perform Eating: with min assist;with adaptive utensils;sitting(With  LRAD and appropriate diet for safety.) Pt Will Perform Grooming: with min assist;sitting;with adaptive equipment(With LRAD for safety and improved functional independence.) Pt Will Perform Upper Body Dressing: with min assist;sitting;with adaptive equipment(With LRAD for safety and improved functional independence.) Pt Will Perform Lower Body Dressing: with min assist;sit to/from stand;with adaptive equipment(With LRAD for safety and improved functional independence.) Pt Will Transfer to Toilet: with min assist;bedside commode;ambulating(With LRAD/DME for safety and improved functional independence.) Pt Will Perform Toileting - Clothing Manipulation and hygiene: with adaptive equipment;with min assist;sit to/from stand(With LRAD for safety and improved functional independence.)  OT Frequency: Min 1X/week   Barriers to D/C:            Co-evaluation PT/OT/SLP Co-Evaluation/Treatment: Yes Reason for Co-Treatment: To address functional/ADL transfers;For patient/therapist safety   OT goals addressed during session: ADL's and self-care;Strengthening/ROM      AM-PAC OT "6 Clicks" Daily Activity     Outcome Measure Help from another person eating meals?: A Lot Help from another person taking care of personal grooming?: A Lot Help from another person toileting, which includes using toliet, bedpan, or urinal?: A Lot Help from another person bathing (including washing, rinsing, drying)?: A Lot Help from another person to put on and taking off regular upper body clothing?: A Lot Help from another person to put on and taking off regular lower body clothing?: A Lot 6 Click Score: 12   End of Session Equipment Utilized During Treatment: Oxygen Nurse Communication:  Other (comment)(Pt need for feeding assistance. )  Activity Tolerance: Other (comment)(Pt became nautious, was unable to attempt standing on this date. ) Patient left: in bed;with call bell/phone within reach;with bed alarm set;with  SCD's reapplied  OT Visit Diagnosis: Other abnormalities of gait and mobility (R26.89);Muscle weakness (generalized) (M62.81);Feeding difficulties (R63.3)                Time: 0225-0240 OT Time Calculation (min): 15 min Charges:  OT General Charges $OT Visit: 1 Visit OT Evaluation $OT Eval High Complexity: 1 High  Shara Blazing, M.S., OTR/L Ascom: [phone] 11/18/18, 3:22 PM

## 2018-11-18 NOTE — Progress Notes (Signed)
OT Cancellation Note  Patient Details Name: Cindy Morrison MRN: [id number] DOB: [birth year]   Cancelled Evaluation:    Reason Eval/Treat Not Completed: Other (comment). Order received and chart reviewed. OT/PT attempted to see patient for co-eval/treatment. OT/PT spoke with RN who cleared pt for tx. PT set up room for functional transfer. PT found patient to have had BM in bed and was in need of clean-up. OT called CNA for clean-up/bedding change. Will re-attempt at a later time/date as available and pt medically appropriate for tx.   Shara Blazing, M.S., OTR/L Ascom: [phone] 11/18/18, 10:54 AM

## 2018-11-18 NOTE — Progress Notes (Signed)
PT Cancellation Note  Patient Details Name: Cindy Morrison MRN: [id number] DOB: 1955-09-22   Cancelled Treatment:    Reason Eval/Treat Not Completed: Other (comment).  PT/OT attempted to see pt for co-eval/treatment.  Room set up for functional mobility.  Pt then noted to have BM in bed and needed clean-up.  OT called CNA for clean-up and bedding change.  Will re-attempt treatment at a later date/time.  Leitha Bleak, PT 11/18/18, 11:08 AM [phone]

## 2018-11-18 NOTE — Progress Notes (Signed)
Physical Therapy Treatment Patient Details Name: Cindy Morrison MRN: [id number] DOB: [birth year] Today's Date: 11/18/2018    History of Present Illness Pt is a 63 y.o. female presenting to hospital 11/01/18 with N/V, midline back pain, and cough.  Pt admitted with acute hypoxic respiratory failure, acute gastroenteritis, cardiac and renal failure, and sepsis.  Pt intubated 11/01/18; extubated and re-intubated 11/04/18, and extubated 11/13/18.  Agitation noted during hospitalization.  PMH includes anxiety, CA, psoriasis, R biceps tenodesis and RCR 12/18/17; (+) smoking.    PT Comments    PT/OT co-treat performed.  Pt nodding head yes/no during session but very limited verbalization; inconsistent with 1 step commands.  Able to sit up on edge of bed with 2 assist but shortly after sitting up pt leaning forward and not appearing to feel well; pt nodded head that she was nauseas and emesis basin given to pt.  Pt sat on edge of bed for a couple minutes but nausea did not appear to improve and pt kept leaning forward and nodded her head when asked if she wanted to lay back down (2 assist to lay back down in bed and scoot up in bed; HOB elevated).  Nurse notified of pt's nausea and pt left with emesis bag.  Will attempt to progress pt with strengthening and progressive functional mobility next session.    Follow Up Recommendations  SNF     Equipment Recommendations  (TBD at next facility)    Recommendations for Other Services OT consult     Precautions / Restrictions Precautions Precautions: Fall Restrictions Weight Bearing Restrictions: No    Mobility  Bed Mobility Overal bed mobility: Needs Assistance Bed Mobility: Supine to Sit;Sit to Supine     Supine to sit: Min assist;Mod assist;+2 for physical assistance;+2 for safety/equipment Sit to supine: Min assist;Mod assist;+2 for physical assistance;+2 for safety/equipment   General bed mobility comments: assist for trunk and B LE's semi-supine  to/from sit  Transfers Overall transfer level: Needs assistance               General transfer comment: deferred d/t pt nauseas sitting edge of bed and pt eventually requesting to lay back down d/t nausea  Ambulation/Gait             General Gait Details: deferred d/t pt nauseas sitting edge of bed and pt eventually requesting to lay back down d/t nausea   Stairs             Wheelchair Mobility    Modified Rankin (Stroke Patients Only)       Balance Overall balance assessment: Needs assistance Sitting-balance support: Bilateral upper extremity supported;Feet unsupported Sitting balance-Leahy Scale: Poor Sitting balance - Comments: pt initially min assist for sitting balance but then leaning forward d/t nausea requiring increased assist                                    Cognition Arousal/Alertness: Awake/alert Behavior During Therapy: Flat affect Overall Cognitive Status: No family/caregiver present to determine baseline cognitive functioning                                 General Comments: Pt able to answer simple yes/no questions on this date. Limited verbalizations t/o session. Inconsistently able to follow 1-step commands.       Exercises      General Comments  Pertinent Vitals/Pain Pain Assessment: No/denies pain Faces Pain Scale: No hurt Pain Intervention(s): Repositioned;Monitored during session    Home Living                Prior Function          PT Goals (current goals can now be found in the care plan section) Acute Rehab PT Goals Patient Stated Goal: pt did not state any goals when asked PT Goal Formulation: Patient unable to participate in goal setting Time For Goal Achievement: 11/30/18 Potential to Achieve Goals: Fair Progress towards PT goals: Progressing toward goals    Frequency    Min 2X/week      PT Plan Current plan remains appropriate    Co-evaluation PT/OT/SLP  Co-Evaluation/Treatment: Yes Reason for Co-Treatment: To address functional/ADL transfers;For patient/therapist safety PT goals addressed during session: Mobility/safety with mobility;Balance OT goals addressed during session: ADL's and self-care;Strengthening/ROM      AM-PAC PT "6 Clicks" Mobility   Outcome Measure  Help needed turning from your back to your side while in a flat bed without using bedrails?: A Lot Help needed moving from lying on your back to sitting on the side of a flat bed without using bedrails?: Total Help needed moving to and from a bed to a chair (including a wheelchair)?: Total Help needed standing up from a chair using your arms (e.g., wheelchair or bedside chair)?: Total Help needed to walk in hospital room?: Total Help needed climbing 3-5 steps with a railing? : Total 6 Click Score: 7    End of Session Equipment Utilized During Treatment: Oxygen(2.5 L O2 via nasal cannula) Activity Tolerance: Other (comment)(limited d/t nausea) Patient left: in bed;with call bell/phone within reach;with bed alarm set Nurse Communication: Mobility status;Precautions PT Visit Diagnosis: Other abnormalities of gait and mobility (R26.89);Muscle weakness (generalized) (M62.81);Difficulty in walking, not elsewhere classified (R26.2)     Time: 1425-1440 PT Time Calculation (min) (ACUTE ONLY): 15 min  Charges:  $Therapeutic Activity: 8-22 mins                    Leitha Bleak, PT 11/18/18, 5:02 PM [phone]

## 2018-11-18 NOTE — TOC Progression Note (Addendum)
Transition of Care [facility]) - Progression Note    Patient Details  Name: Cindy Morrison MRN: [id number] Date of Birth: 10-Jun-1956  Transition [facility]) CM/SW Contact  Ross Ludwig, [place] Phone Number: 11/18/2018, 5:13 PM  Clinical Narrative:  CSW contacted patient's husband and CSW presented bed offers, patient's husband chose [facility]  CSW spoke with [facility] and they are in network with insurance company and are able to accept patient once she is medically ready for discharge and insurance authorization has been received.  CSW updated patient's husband and he is in agreement to having patient go to SNF for short term rehab.  CSW sent updated therapy notes to SNF.    Expected Discharge Plan: [place] Barriers to Discharge: Continued Medical Work up  Expected Discharge Plan and Services Expected Discharge Plan: [place]   Discharge Planning Services: CM Consult Post Acute Care Choice: [place] Living arrangements for the past 2 months: Single Family Home                           Social Determinants of Health (SDOH) Interventions    Readmission Risk Interventions No flowsheet data found.

## 2018-11-18 NOTE — Progress Notes (Addendum)
Speech Language Pathology Treatment: Dysphagia  Patient Details Name: Cindy Morrison MRN: [id number] DOB: 03/08/1956 Today's Date: 11/18/2018 Time: 2202-5427 SLP Time Calculation (min) (ACUTE ONLY): 45 min  Assessment / Plan / Recommendation Clinical Impression  Pt seen today for ongoing assessment of swallowing; trials to progress to an oral diet. Per NSG report, pt has been experiencing N/V. Min emesis was noted at start of tx session; NSG gave medication. Pt was shaky intermittently as well though more focused and engaged w/ SLP more appropriately this session vs yesterday's session. She verbalized "I miss my children" several times and was min labile. Pt still continues to present w/ shakiness and declined Cognitive/mental status requiring Mod verbal cues and redirection during tasks.  Pt positioned upright in bed and presented w/ trials of thin liquids (ice, water, clear Soda) then small tsps of Jello for the increased texture assessment. Pt helped to hold cup and feed self the drink taking small sips - SLP monitored the feedings to go slowly. Adequate oral phase bolus management was noted; timely A-P transfer for swallowing noted. Swallows appeared complete. No overt s/s of aspiration were noted during/post the trials; pt did verbalize something about her children after 3 trials w/ clear vocal quality. No gross changes in respiratory effort; no WOB/SOB post trials. Same was noted w/ trials of Jello w/ the addition of fairly adequate mashing of bolus material w/ no anterior spillage.  Due to pt's discomfort from N/V, only few trials given. She did not indicate any discomfort but again min focused engagement occurred. Recommended initiation of a Clear liquid diet (thin consistency) w/ aspiration precautions; Pills Crushed and given in puree. Feeding support and Supervision w/ all oral intake reducing distractions during meals. ST services will continue to f/u w/ pt's status next 1-2 days in order to  increase diet consistency to least restrictive consistency safe for pt w/ her current Cognitive/mental status. NSG updated.      HPI HPI: Pt is a 63 y.o. female with a known history of anxiety, hx melanoma, GERD who presented to the ED with intractable nausea, vomiting, and malaise over the last couple of days.  She also endorsed some midline low back pain.  She states she is "just not feeling well".  She denies any abdominal pain, diarrhea, or constipation.  No chest pain, shortness of breath, fevers, chills.  Found to be hypoxic in the ED.  In ICU, pt became more agitated and hypoxic and required emergent intubation and placed on contact precautions high risk.  Nasopharyngeal swab for COVID sent and negative.  Patient also became febrile on 11/02/2018.  WBC went from 16.8 on 11/02/2018 to 4.6 on 11/03/2018 along with lymphopenia.  Pt agitated when attempting to wean from vent.  During aggitation with multiple attempts to self extubate, pt did extubated then required re-intubation post aspiration of copious secretions.  Pt was finally extubated on 11/13/2018.  She is tolerating extubation well but remains significantly encephalopathic and requiring medications for anxiety/agitation.        SLP Plan  Continue with current plan of care       Recommendations  Diet recommendations: Thin liquid(Clear Liquid diet (N/V)) Liquids provided via: Cup;No straw Medication Administration: Crushed with puree(if unable to give via alternative means) Supervision: Patient able to self feed;Staff to assist with self feeding;Full supervision/cueing for compensatory strategies Compensations: Minimize environmental distractions;Slow rate;Small sips/bites;Multiple dry swallows after each bite/sip;Follow solids with liquid;Lingual sweep for clearance of pocketing Postural Changes and/or Swallow Maneuvers: Seated upright  90 degrees;Upright 30-60 min after meal                General recommendations: (Dietician  f/u) Oral Care Recommendations: Oral care BID;Staff/trained caregiver to provide oral care Follow up Recommendations: Skilled Nursing facility(TBD) SLP Visit Diagnosis: Dysphagia, oropharyngeal phase (R13.12) Plan: Continue with current plan of care       [place], [place], CCC-SLP , 11/18/2018, 10:38 AM

## 2018-11-18 NOTE — Progress Notes (Signed)
[place] [facility] [place] NAME: Cindy Morrison    MR#:  [id number]  DATE OF BIRTH:  1955/10/27  SUBJECTIVE:    Patient more alert this morning.  Responds with a yes and no.  She is able to talk however she is only whispering.  She is able to follow all commands.  She denies chest pain, shortness of breath, abdominal pain, nausea or vomiting REVIEW OF SYSTEMS:    Review of Systems  Constitutional: Negative.  Negative for chills, fever and malaise/fatigue.  HENT: Negative.  Negative for ear discharge, ear pain, hearing loss, nosebleeds and sore throat.   Eyes: Negative.  Negative for blurred vision and pain.  Respiratory: Negative.  Negative for cough, hemoptysis, shortness of breath and wheezing.   Cardiovascular: Negative.  Negative for chest pain, palpitations and leg swelling.  Gastrointestinal: Negative.  Negative for abdominal pain, blood in stool, diarrhea, nausea and vomiting.  Genitourinary: Negative.  Negative for dysuria.  Musculoskeletal: Negative.  Negative for back pain.  Skin: Negative.   Neurological: Negative for dizziness, tremors, speech change, focal weakness, seizures and headaches.  Endo/Heme/Allergies: Negative.  Does not bruise/bleed easily.  Psychiatric/Behavioral: Negative.  Negative for depression, hallucinations and suicidal ideas.     Tolerating Diet: npo      DRUG ALLERGIES:   Allergies  Allergen Reactions  . Erythromycin Base Swelling    Erythromycin ophthalmic ointment    VITALS:  Blood pressure (!) 168/83, pulse 77, temperature 98.5 F (36.9 C), temperature source Oral, resp. rate 18, height 5\' 6"  (1.676 m), weight 81.2 kg, SpO2 97 %.  PHYSICAL EXAMINATION:  Constitutional: Appears well-developed and well-nourished. No distress. HENT: Normocephalic. .   Eyes: Conjunctivae and EOM are normal. PERRLA, no scleral icterus.  Neck: Normal ROM. Neck supple. No JVD. No tracheal deviation. CVS: RRR, S1/S2 +,  no murmurs, no gallops, no carotid bruit.  Pulmonary: Creased breath sounds throughout lung fields no stridor, rhonchi, wheezes, rales.  Abdominal: Soft. BS +,  no distension, tenderness, rebound or guarding.  Musculoskeletal: Normal range of motion. No edema and no tenderness.  Neuro: No focal deficit.  Follows all commands skin: Skin is warm and dry. No rash noted.      LABORATORY PANEL:   CBC Recent Labs  Lab 11/18/18 0405  WBC 10.2  HGB 10.7*  HCT 32.2*  PLT 529*   ------------------------------------------------------------------------------------------------------------------  Chemistries  Recent Labs  Lab 11/15/18 0547  11/17/18 0507 11/18/18 0405  NA 137   < > 137 137  K 3.1*   < > 2.9* 3.2*  CL 104   < > 102 103  CO2 23   < > 24 22  GLUCOSE 161*   < > 132* 116*  BUN 10   < > 8 8  CREATININE 0.44   < > 0.45 0.43*  CALCIUM 7.9*   < > 8.5* 8.4*  MG 1.9   < > 1.8  --   AST 29  --   --   --   ALT 35  --   --   --   ALKPHOS 50  --   --   --   BILITOT 0.8  --   --   --    < > = values in this interval not displayed.   ------------------------------------------------------------------------------------------------------------------  Cardiac Enzymes No results for input(s): TROPONINI in the last 168 hours. ------------------------------------------------------------------------------------------------------------------  RADIOLOGY:  No results found.   ASSESSMENT AND PLAN:   63 year old female with  a history of anxiety who presented to the ER due to intractable nausea, vomiting and malaise.  She was found to have new bigeminy while in the emergency room and hypotension.  1.  Hypovolemic/septic shock: Patient was in the ICU.  Vasopressors and IVF were used to keep map greater than 65.    This has resolved.  2.  Sepsis from aspiration pneumonia: Sepsis has resolved. Patient evaluated by ID.  She was placed on broad-spectrum antibiotics for aspiration  pneumonia.  Blood cultures have remained negative and MRSA PCR was negative as well.  She was ruled out for COVID-19.   3.  Severe acute hypoxic and hypercapnic respiratory failure from aspiration pneumonia and septic shock: Patient was intubated on the day of admission and subsequently extubated on April 10.  She was then placed on BiPAP and is now on nasal cannula. Wean to room air  4.  Nausea, vomiting and malaise: This was patient's initial complaint and was due to acute gastroenteritis and has subsequently resolved.  5.  Acute encephalopathy: This may be due to ICU delirium and/or sedatives given while patient was intubated. She is slowly showing signs of resolution.  6 History of malignant melanoma of the eye   7.  Nutrition: Continue clear liquid diet as per speech pathologist recommendations. 8 History of anxiety: Continue Wellbutrin and Effexor when patient able to tolerate p.o.  8.  Hypokalemia: This is been repleted by pharmacy.  Discussed with patient's husband Management plans discussed with nursing  CODE STATUS: full  TOTAL TIME TAKING CARE OF THIS PATIENT: 25 minutes.   PT evaluation for discharge planning  POSSIBLE D/C ??, DEPENDING ON CLINICAL CONDITION.   Bettey Costa M.D on 11/18/2018 at 12:14 PM  Between 7am to 6pm - Pager - [phone] After 6pm go to [email or web address] - password [email or web address] [place] [facility]  Office  [phone]  CC: Primary care physician; Katheren Shams  Note: This dictation was prepared with Dragon dictation along with smaller phrase technology. Any transcriptional errors that result from this process are unintentional.

## 2018-11-18 NOTE — Progress Notes (Signed)
Pharmacy Electrolyte Monitoring Consult:  Pharmacy consulted to assist in monitoring and replacing electrolytes in this 63 y.o. female admitted on 11/01/2018 with Emesis   Labs:  Sodium (mmol/L)  Date Value  11/18/2018 137  03/16/2014 136   Potassium (mmol/L)  Date Value  11/18/2018 3.2 (L)  03/16/2014 3.8   Magnesium (mg/dL)  Date Value  11/17/2018 1.8   Phosphorus (mg/dL)  Date Value  11/16/2018 3.5   Calcium (mg/dL)  Date Value  11/18/2018 8.4 (L)   Calcium, Total (mg/dL)  Date Value  03/16/2014 9.0   Albumin (g/dL)  Date Value  11/15/2018 2.5 (L)  03/16/2014 3.8   Corrected Calcium: 9.6  Assessment/Plan: 4/15@0405 : K 3.2  MIVF transitioned to D5LR/15mEq Potassium at 14mL/hr. This will provide 48 mEq of potassium replacement.  Patient will also be ordered KCl 10 mEq IV(central line) x 4 this morning.  Will defer further replacement at this time. Will obtain BMP with am labs.   Pharmacy will continue to monitor and adjust per consult.    Pearla Dubonnet, PharmD Clinical Pharmacist 11/18/2018 7:22 AM

## 2018-11-19 ENCOUNTER — Inpatient Hospital Stay: Payer: Managed Care, Other (non HMO)

## 2018-11-19 LAB — BASIC METABOLIC PANEL
Anion gap: 9 (ref 5–15)
BUN: 11 mg/dL (ref 8–23)
CO2: 23 mmol/L (ref 22–32)
Calcium: 8.4 mg/dL — ABNORMAL LOW (ref 8.9–10.3)
Chloride: 102 mmol/L (ref 98–111)
Creatinine, Ser: 0.37 mg/dL — ABNORMAL LOW (ref 0.44–1.00)
GFR calc Af Amer: >60 mL/min (ref >60)
GFR calc non Af Amer: >60 mL/min (ref >60)
Glucose, Bld: 133 mg/dL — ABNORMAL HIGH (ref 70–99)
Potassium: 3.7 mmol/L (ref 3.5–5.1)
Sodium: 134 mmol/L — ABNORMAL LOW (ref 135–145)

## 2018-11-19 LAB — PHOSPHORUS: Phosphorus: 4 mg/dL (ref 2.5–4.6)

## 2018-11-19 LAB — MAGNESIUM: Magnesium: 1.9 mg/dL (ref 1.7–2.4)

## 2018-11-19 LAB — GLUCOSE, CAPILLARY
Glucose-Capillary: 109 mg/dL — ABNORMAL HIGH (ref 70–99)
Glucose-Capillary: 123 mg/dL — ABNORMAL HIGH (ref 70–99)
Glucose-Capillary: 128 mg/dL — ABNORMAL HIGH (ref 70–99)

## 2018-11-19 MED ORDER — ASPIRIN EC 81 MG PO TBEC
81.0000 mg | DELAYED_RELEASE_TABLET | Freq: Every day | ORAL | Status: DC
  Administered 2018-11-19: 81 mg via ORAL
  Filled 2018-11-19 (×4): qty 1

## 2018-11-19 MED ORDER — METOPROLOL SUCCINATE ER 25 MG PO TB24
25.0000 mg | ORAL_TABLET | Freq: Every day | ORAL | Status: DC
  Administered 2018-11-19 – 2018-11-21 (×2): 25 mg via ORAL
  Filled 2018-11-19 (×4): qty 1

## 2018-11-19 NOTE — Progress Notes (Signed)
Patient meds crushed in applesauce as per speech recommendations on board.  Patient took most of crushed meds orally.  Did refuse last bite of applesauce.  Applesauce appeared to have some medication left in it.  Will make MD aware.

## 2018-11-19 NOTE — TOC Progression Note (Signed)
Transition of Care [facility]) - Progression Note    Patient Details  Name: Cindy Morrison MRN: [id number] Date of Birth: April 29, 1956  [facility] CM/SW Contact  Ross Ludwig, [place] Phone Number: 11/19/2018, 5:02 PM  Clinical Narrative:     CSW received phone call from Butch Penny at [employer] that they have approved patient for SNF placement at [facility].  Authorization is good for two weeks, she requested CSW to call her once patient discharges to SNF.  Her phone number is [phone], she stated that she will be off on Friday, but to leave a voice mail about discharge date.  Claiborne Billings at [facility] is aware of authorization.   Expected Discharge Plan: [place] Barriers to Discharge: Continued Medical Work up  Expected Discharge Plan and Services Expected Discharge Plan: [place]   Discharge Planning Services: CM Consult Post Acute Care Choice: [place] Living arrangements for the past 2 months: Single Family Home                           Social Determinants of Health (SDOH) Interventions    Readmission Risk Interventions No flowsheet data found.

## 2018-11-19 NOTE — Progress Notes (Signed)
Ch stopped by pt while making rounds. Pt was alert but presented to have a flat affect. Pt was able to respond to questions by nodding her head w/ very limited verbal responses. Ch is concerned pt may have delirium related to LOS in hospital. Ch checked in w/ nurse of pt who has been in contact w/ pt's daughter. Pt is usually active person according to daughter. F/u recommended.    11/19/18 1400  Clinical Encounter Type  Visited With Patient;Health care provider  Visit Type Spiritual support;Psychological support;Social support  Spiritual Encounters  Spiritual Needs Emotional;Grief support  Stress Factors  Patient Stress Factors Loss of control;Major life changes;Health changes  Family Stress Factors None identified

## 2018-11-19 NOTE — Progress Notes (Addendum)
Pharmacy Electrolyte Monitoring Consult:  Pharmacy consulted to assist in monitoring and replacing electrolytes in this 63 y.o. female admitted on 11/01/2018 with Emesis   Labs:  Sodium (mmol/L)  Date Value  11/19/2018 134 (L)  03/16/2014 136   Potassium (mmol/L)  Date Value  11/19/2018 3.7  03/16/2014 3.8   Magnesium (mg/dL)  Date Value  11/19/2018 1.9   Phosphorus (mg/dL)  Date Value  11/19/2018 4.0   Calcium (mg/dL)  Date Value  11/19/2018 8.4 (L)   Calcium, Total (mg/dL)  Date Value  03/16/2014 9.0   Albumin (g/dL)  Date Value  11/15/2018 2.5 (L)  03/16/2014 3.8   Corrected Calcium: 9.6  Assessment/Plan: 4/16@0347 :  K: 3.7   Mg: 1.9   Phos: 4.0  MIVF transitioned to D5LR/76mEq Potassium at 48mL/hr. This will provide 48 mEq of potassium replacement.  Patient's current electrolytes are WNL. Will defer further replacement at this time.   Will obtain BMP with am labs.   Pharmacy will continue to monitor and adjust per consult.    Pearla Dubonnet, PharmD Clinical Pharmacist 11/19/2018 7:24 AM

## 2018-11-19 NOTE — Progress Notes (Signed)
[facility] NAME: Cindy Morrison    MR#:  [id number]  DATE OF BIRTH:  Jaimi 22, 1957  SUBJECTIVE:    Patient doing well this morning.  Still has a whisper however is able to follow all commands and mental status has shown much improvement.  She is tolerating breakfast this morning. REVIEW OF SYSTEMS:    Review of Systems  Constitutional: Negative.  Negative for chills, fever and malaise/fatigue.  HENT: Negative.  Negative for ear discharge, ear pain, hearing loss, nosebleeds and sore throat.   Eyes: Negative.  Negative for blurred vision and pain.  Respiratory: Negative.  Negative for cough, hemoptysis, shortness of breath and wheezing.   Cardiovascular: Negative.  Negative for chest pain, palpitations and leg swelling.  Gastrointestinal: Negative.  Negative for abdominal pain, blood in stool, diarrhea, nausea and vomiting.  Genitourinary: Negative.  Negative for dysuria.  Musculoskeletal: Negative.  Negative for back pain.  Skin: Negative.   Neurological: Negative for dizziness, tremors, speech change, focal weakness, seizures and headaches.  Endo/Heme/Allergies: Negative.  Does not bruise/bleed easily.  Psychiatric/Behavioral: Negative.  Negative for depression, hallucinations and suicidal ideas.     Tolerating Diet:  yes     DRUG ALLERGIES:   Allergies  Allergen Reactions  . Erythromycin Base Swelling    Erythromycin ophthalmic ointment    VITALS:  Blood pressure (!) 179/98, pulse 80, temperature 97.6 F (36.4 C), temperature source Oral, resp. rate 19, height 5\' 6"  (1.676 m), weight 81.2 kg, SpO2 95 %.  PHYSICAL EXAMINATION:  Constitutional: Appears well-developed and well-nourished. No distress. HENT: Normocephalic. .   Eyes: Conjunctivae and EOM are normal. PERRLA, no scleral icterus.  Neck: Normal ROM. Neck supple. No JVD. No tracheal deviation. CVS: RRR, S1/S2 +, no murmurs, no gallops, no carotid bruit.   Pulmonary: Creased breath sounds throughout lung fields no stridor, rhonchi, wheezes, rales.  Abdominal: Soft. BS +,  no distension, tenderness, rebound or guarding.  Musculoskeletal: Normal range of motion. No edema and no tenderness.  Neuro: No focal deficit.  Follows all commands skin: Skin is warm and dry. No rash noted.      LABORATORY PANEL:   CBC Recent Labs  Lab 11/18/18 0405  WBC 10.2  HGB 10.7*  HCT 32.2*  PLT 529*   ------------------------------------------------------------------------------------------------------------------  Chemistries  Recent Labs  Lab 11/15/18 0547  11/19/18 0347  NA 137   < > 134*  K 3.1*   < > 3.7  CL 104   < > 102  CO2 23   < > 23  GLUCOSE 161*   < > 133*  BUN 10   < > 11  CREATININE 0.44   < > 0.37*  CALCIUM 7.9*   < > 8.4*  MG 1.9   < > 1.9  AST 29  --   --   ALT 35  --   --   ALKPHOS 50  --   --   BILITOT 0.8  --   --    < > = values in this interval not displayed.   ------------------------------------------------------------------------------------------------------------------  Cardiac Enzymes No results for input(s): TROPONINI in the last 168 hours. ------------------------------------------------------------------------------------------------------------------  RADIOLOGY:  Dg Abd 1 View  Result Date: 11/19/2018 CLINICAL DATA:  Nausea and vomiting. EXAM: ABDOMEN - 1 VIEW COMPARISON:  None. FINDINGS: The bowel gas pattern is normal. No radio-opaque calculi or other significant radiographic abnormality are seen. IMPRESSION: Negative one view abdomen. Electronically Signed   By: Harrell Gave  Mattern M.D.   On: 11/19/2018 08:58     ASSESSMENT AND PLAN:   63 year old female with a history of anxiety who presented to the ER due to intractable nausea, vomiting and malaise.  She was found to have new bigeminy while in the emergency room and hypotension.  1.  Hypovolemic/septic shock: Patient was in the ICU.   Vasopressors and IVF were used to keep map greater than 65.    This has resolved.  2.  Sepsis from aspiration pneumonia: Sepsis has resolved. Patient evaluated by ID.  She was placed on broad-spectrum antibiotics for aspiration pneumonia.  Blood cultures have remained negative and MRSA PCR was negative as well.  She was ruled out for COVID-19.   3.  Severe acute hypoxic and hypercapnic respiratory failure from aspiration pneumonia and septic shock: Patient was intubated on the day of admission and subsequently extubated on April 10.  She was then placed on BiPAP and is now on nasal cannula. Wean to room air as tolerated.  4.  Nausea, vomiting and malaise: This was patient's initial complaint and was due to acute gastroenteritis and has subsequently resolved. KUB today without evidence of obstruction.  5.  Acute encephalopathy: This is be due to ICU delirium and sedatives given while patient was intubated. She is showing signs of continued improvement in her mental status.  6 History of malignant melanoma of the eye   7.  Nutrition: Speech consultant to reevaluate patient diet today.   8 History of anxiety: Continue Wellbutrin and Effexor   9  Hypokalemia: This is been repleted by pharmacy.   Physical therapy has recommended skilled nursing facility upon discharge. Patient's husband is agreeable to this as it is patient.  Discussed with patient's husband  Management plans discussed with patient.  CODE STATUS: full  TOTAL TIME TAKING CARE OF THIS PATIENT: 22 minutes.    POSSIBLE D/C tomorrow DEPENDING ON CLINICAL CONDITION.   Bettey Costa M.D on 11/19/2018 at 11:03 AM  Between 7am to 6pm - Pager - [phone] After 6pm go to [email or web address] - password [email or web address] [facility]  Office  [phone]  CC: Primary care physician; Katheren Shams  Note: This dictation was prepared with Dragon dictation along with smaller phrase technology. Any  transcriptional errors that result from this process are unintentional.

## 2018-11-19 NOTE — Progress Notes (Signed)
  Speech Language Pathology Treatment: Dysphagia  Patient Details Name: Cindy Morrison MRN: [id number] DOB: 03-15-1956 Today's Date: 11/19/2018 Time: 3785-8850 SLP Time Calculation (min) (ACUTE ONLY): 18 min  Assessment / Plan / Recommendation Clinical Impression  Patient seen today for ongoing assessment of swallowing and readiness for diet advancement.  The patient is taking the clear liquid diet without difficulty per staff.  The patient consistently refused to attempt solid foods (jello, apple sauce, Sybert cracker).  The patient appears to be more attentive and alert than previous sessions and is not nauseous.  The patient indicated that she understood that she could not ger "real food" until she is assessed by SLP.  She indicated that she preferred to stay on the liquid diet for now.  SLP told her to let her nurse know when she is ready to try solid foods.  Recommend continuing liquid diet for now, SLP will continue to follow for ongoing assessment of swallowing.    HPI HPI: Pt is a 63 y.o. female with a known history of anxiety, hx melanoma, GERD who presented to the ED with intractable nausea, vomiting, and malaise over the last couple of days.  She also endorsed some midline low back pain.  She states she is "just not feeling well".  She denies any abdominal pain, diarrhea, or constipation.  No chest pain, shortness of breath, fevers, chills.  Found to be hypoxic in the ED.  In ICU, pt became more agitated and hypoxic and required emergent intubation and placed on contact precautions high risk.  Nasopharyngeal swab for COVID sent and negative.  Patient also became febrile on 11/02/2018.  WBC went from 16.8 on 11/02/2018 to 4.6 on 11/03/2018 along with lymphopenia.  Pt agitated when attempting to wean from vent.  During aggitation with multiple attempts to self extubate, pt did extubated then required re-intubation post aspiration of copious secretions.  Pt was finally extubated on 11/13/2018.  She is  tolerating extubation well but remains significantly encephalopathic and requiring medications for anxiety/agitation.        SLP Plan  Continue with current plan of care       Recommendations  Medication Administration: Crushed with puree Supervision: Patient able to self feed;Staff to assist with self feeding;Full supervision/cueing for compensatory strategies Compensations: Minimize environmental distractions;Slow rate;Small sips/bites;Multiple dry swallows after each bite/sip;Follow solids with liquid;Lingual sweep for clearance of pocketing Postural Changes and/or Swallow Maneuvers: Seated upright 90 degrees;Upright 30-60 min after meal                SLP Visit Diagnosis: Dysphagia, oropharyngeal phase (R13.12) Plan: Continue with current plan of care       GO               Leroy Sea, MS/CCC- SLP  Valetta Fuller, Daine Floras 11/19/2018, 1:19 PM

## 2018-11-19 NOTE — Progress Notes (Signed)
PT Cancellation Note  Patient Details Name: Cindy Morrison MRN: [id number] DOB: [birth year]   Cancelled Treatment:    Reason Eval/Treat Not Completed: Patient declined, no reason specified. Treatment attempted a second time after patient's lunch. Pt refused at this time despite encouragement statin she does not want to do therapy. Re attempt at a later date.    Larae Grooms, PTA 11/19/2018, 2:26 PM

## 2018-11-19 NOTE — Progress Notes (Signed)
PT Cancellation Note  Patient Details Name: Cindy Morrison MRN: [id number] DOB: 1956/06/07   Cancelled Treatment:    Reason Eval/Treat Not Completed: Other (comment). Treatment attempted; pt eating with nurse tech and ST also arrives to work with pt. Re attempt at a later time/date, as the schedule allows.    Larae Grooms, PTA 11/19/2018, 12:50 PM

## 2018-11-19 NOTE — Progress Notes (Signed)
Patient PICC line in R upper forearm removed at this time.  Patient tolerated well. Tip intact.  No complaints.  Patient with peripheral IV in R arm.

## 2018-11-20 LAB — BASIC METABOLIC PANEL
Anion gap: 11 (ref 5–15)
BUN: 10 mg/dL (ref 8–23)
CO2: 23 mmol/L (ref 22–32)
Calcium: 8.9 mg/dL (ref 8.9–10.3)
Chloride: 98 mmol/L (ref 98–111)
Creatinine, Ser: 0.33 mg/dL — ABNORMAL LOW (ref 0.44–1.00)
GFR calc Af Amer: >60 mL/min (ref >60)
GFR calc non Af Amer: >60 mL/min (ref >60)
Glucose, Bld: 131 mg/dL — ABNORMAL HIGH (ref 70–99)
Potassium: 3.6 mmol/L (ref 3.5–5.1)
Sodium: 132 mmol/L — ABNORMAL LOW (ref 135–145)

## 2018-11-20 LAB — MAGNESIUM: Magnesium: 1.9 mg/dL (ref 1.7–2.4)

## 2018-11-20 LAB — GLUCOSE, CAPILLARY
Glucose-Capillary: 139 mg/dL — ABNORMAL HIGH (ref 70–99)
Glucose-Capillary: 97 mg/dL (ref 70–99)

## 2018-11-20 MED ORDER — MEGESTROL ACETATE 400 MG/10ML PO SUSP
300.0000 mg | Freq: Two times a day (BID) | ORAL | Status: DC
  Filled 2018-11-20 (×5): qty 10

## 2018-11-20 MED ORDER — ADULT MULTIVITAMIN W/MINERALS CH
1.0000 | ORAL_TABLET | Freq: Every day | ORAL | Status: DC
  Filled 2018-11-20 (×2): qty 1

## 2018-11-20 MED ORDER — ENSURE ENLIVE PO LIQD: 237.0000 mL | Freq: Two times a day (BID) | ORAL | Status: DC

## 2018-11-20 MED ORDER — SODIUM CHLORIDE 0.9 % IV SOLN
INTRAVENOUS | Status: AC
  Administered 2018-11-20 – 2018-11-21 (×2): via INTRAVENOUS

## 2018-11-20 NOTE — Progress Notes (Signed)
Physical Therapy Treatment Patient Details Name: Cindy Morrison MRN: [id number] DOB: Dec 07, 1955 Today's Date: 11/20/2018    History of Present Illness Pt is a 63 y.o. female presenting to hospital 11/01/18 with N/V, midline back pain, and cough.  Pt admitted with acute hypoxic respiratory failure, acute gastroenteritis, cardiac and renal failure, and sepsis.  Pt intubated 11/01/18; extubated and re-intubated 11/04/18, and extubated 11/13/18.  Agitation noted during hospitalization.  PMH includes anxiety, CA, psoriasis, R biceps tenodesis and RCR 12/18/17; (+) smoking.    PT Comments    Patient tolerated treatment well and is making progress towards goals at this point. Patient sleepy at times this session but more interactive than previously documented. Communicated that she did not want to get up to the chair but agreed to attempt standing two times while bed was changed and pericare performed. Patient continues to demonstrate difficulty with motor planning and initiation of movement, but was able to follow some single-step commands. Did smile and show emotion a couple of times. Patient was found seated on edge of bed with foot crossed over knee upon start of session; unknown how long pt in this position and it appeared she had achieved it independently. Patient performed sit <> stand with mod-max A +2 standing at each side from/to elevated bed to RW. Failed to fully extend knees and hips. Required manual assist to place hands on/off RW. Backward lean at times. Unable to ambulate safely at this date, so deferred. Patient performed sit to supine with mod A +2 to support BLE and trunk. Total A +2 scoot up bed with draw sheet. Rolling left and back to midline with mod A +1. Patient would benefit from continued physical therapy to address remaining impairments and functional limitations to work towards stated goals and return to PLOF or maximal functional independence.    Follow Up Recommendations  SNF      Equipment Recommendations  (TBD at next facility)    Recommendations for Other Services OT consult     Precautions / Restrictions Precautions Precautions: Fall Restrictions Weight Bearing Restrictions: No    Mobility  Bed Mobility Overal bed mobility: Needs Assistance( ) Bed Mobility: Sit to Supine;Rolling Rolling: Mod assist     Sit to supine: Mod assist;+2 for physical assistance   General bed mobility comments: pt seated on edge of bed with foot crossed over knee upon start of session; unknown how long pt in this position. Patient performed sit to supine with mod A +2 to support BLE and trunk. Total A +2 scoot up bed with draw sheet. Rolling left and back to midline with mod A +1.   Transfers Overall transfer level: Needs assistance Equipment used: Rolling walker (2 wheeled) Transfers: Sit to/from Stand Sit to Stand: +2 physical assistance;Max assist;Mod assist         General transfer comment: sit <> stand with mod-max A +2 standing at each side from/to elevated bed to RW. Failed to fully extend knees and hips. Required manual assist to place hands on/off RW. Backward lean at times.   Ambulation/Gait             General Gait Details: unable to ambulate safely at this date   Stairs             Wheelchair Mobility    Modified Rankin (Stroke Patients Only)       Balance Overall balance assessment: Needs assistance Sitting-balance support: Bilateral upper extremity supported;Feet supported Sitting balance-Leahy Scale: Fair Sitting balance - Comments: pt  found sitting at edge of bed with ankle crossed over knee. Requierd support with edge of bed sitting at times when started to get tired and sleepy.      Standing balance-Leahy Scale: Poor Standing balance comment: Standing at edge of bed for 2x59min with mod A -max A+2, varying ability to maintain balance. Required consistent cuing to extend hips and knees and unable to do so fully.                              Cognition Arousal/Alertness: Lethargic(mildly lethargic, starts to fall asleep when not actively engaged) Behavior During Therapy: Flat affect Overall Cognitive Status: No family/caregiver present to determine baseline cognitive functioning                                 General Comments: Pt able to answer simple yes/no questions on this date. Limited verbalizations t/o session. Inconsistently able to follow 1-step commands.       Exercises      General Comments        Pertinent Vitals/Pain Pain Assessment: Faces Faces Pain Scale: No hurt    Home Living                      Prior Function            PT Goals (current goals can now be found in the care plan section) Acute Rehab PT Goals Patient Stated Goal: pt did not state any goals when asked PT Goal Formulation: Patient unable to participate in goal setting Time For Goal Achievement: 11/30/18 Potential to Achieve Goals: Fair Progress towards PT goals: Progressing toward goals    Frequency    Min 2X/week      PT Plan Current plan remains appropriate    Co-evaluation PT/OT/SLP Co-Evaluation/Treatment: Yes Reason for Co-Treatment: To address functional/ADL transfers;For patient/therapist safety;Necessary to address cognition/behavior during functional activity PT goals addressed during session: Mobility/safety with mobility;Balance;Proper use of DME OT goals addressed during session: ADL's and self-care;Proper use of Adaptive equipment and DME      AM-PAC PT "6 Clicks" Mobility   Outcome Measure  Help needed turning from your back to your side while in a flat bed without using bedrails?: A Lot Help needed moving from lying on your back to sitting on the side of a flat bed without using bedrails?: A Little Help needed moving to and from a bed to a chair (including a wheelchair)?: Total Help needed standing up from a chair using your arms (e.g., wheelchair or  bedside chair)?: A Lot Help needed to walk in hospital room?: Total Help needed climbing 3-5 steps with a railing? : Total 6 Click Score: 10    End of Session Equipment Utilized During Treatment: Gait belt Activity Tolerance: Patient tolerated treatment well;Patient limited by fatigue;Patient limited by lethargy Patient left: in bed;with call bell/phone within reach;with bed alarm set Nurse Communication: Mobility status;Precautions(results of session, assisted with pericare) PT Visit Diagnosis: Other abnormalities of gait and mobility (R26.89);Muscle weakness (generalized) (M62.81);Difficulty in walking, not elsewhere classified (R26.2)     Time: 4196-2229 PT Time Calculation (min) (ACUTE ONLY): 30 min  Charges:  $Therapeutic Activity: 8-22 mins                     Everlean Alstrom. Graylon Good, PT, DPT 11/20/18, 2:34 PM

## 2018-11-20 NOTE — Progress Notes (Signed)
Pharmacy Electrolyte Monitoring Consult:  Pharmacy consulted to assist in monitoring and replacing electrolytes in this 63 y.o. female admitted on 11/01/2018 with Emesis   Labs:  Sodium (mmol/L)  Date Value  11/20/2018 132 (L)  03/16/2014 136   Potassium (mmol/L)  Date Value  11/20/2018 3.6  03/16/2014 3.8   Magnesium (mg/dL)  Date Value  11/20/2018 1.9   Phosphorus (mg/dL)  Date Value  11/19/2018 4.0   Calcium (mg/dL)  Date Value  11/20/2018 8.9   Calcium, Total (mg/dL)  Date Value  03/16/2014 9.0   Albumin (g/dL)  Date Value  11/15/2018 2.5 (L)  03/16/2014 3.8   Corrected Calcium: 9.6  Assessment/Plan: 4/17@0731 :  K: 3.6   Mg: 1.9  MIVF transitioned to D5LR/38mEq Potassium at 15mL/hr. This will provide 48 mEq of potassium replacement.  Patient's current electrolytes are WNL. Will defer further replacement at this time.   Will obtain BMP with am labs.   Pharmacy will continue to monitor and adjust per consult.    Pearla Dubonnet, PharmD Clinical Pharmacist 11/20/2018 8:09 AM

## 2018-11-20 NOTE — Progress Notes (Signed)
Nutrition Follow-up  RD working remotely.  DOCUMENTATION CODES:   Not applicable  INTERVENTION:   Ensure Enlive po BID, each supplement provides 350 kcal and 20 grams of protein  MVI daily   Pt at moderate refeed risk; recommend monitor K, Mg and P labs daily   NUTRITION DIAGNOSIS:   Inadequate oral intake related to inability to eat as evidenced by NPO status. -pt advanced to soft diet now   GOAL:   Patient will meet greater than or equal to 90% of their needs -progressing   MONITOR:   PO intake, Supplement acceptance, Labs, Weight trends, Skin, I & O's  ASSESSMENT:   63 year old female with PMHx of GERD, psoriasis, anxiety, COPD admitted with severe acute hypoxic and hypercapnic respiratory failure from PNA and septic shock requiring intubation on 3/29, severe COPD exacerbation, undergoing r/o for COVID-19. Of note patient had intractable N/V prior to admission.   Pt advanced to soft diet; pt eating only sips and bites of meals. RD will add Ensure and MVI to help pt meet her estimated needs. May need to consider NGT and enteral feeds if appetite does not improve. Can consider appetite stimulant. Per chart, pt down ~16lbs since admit; RD will continue monitor.   Medications reviewed and include: aspirin, lovenox, nicotine  Labs reviewed: Na 132(L), K 3.6 wnl, P 4.0 wnl, Mg 1.9 wnl Hgb 10.7(L), Hct 32.2(L)  Diet Order:   Diet Order            DIET SOFT Room service appropriate? Yes; Fluid consistency: Thin  Diet effective now             EDUCATION NEEDS:   No education needs have been identified at this time  Skin:  Skin Assessment: Skin Integrity Issues:(MSAD to groin)  Last BM:  4/16- type 7  Height:   Ht Readings from Last 1 Encounters:  11/01/18 5\' 6"  (1.676 m)   Weight:   Wt Readings from Last 1 Encounters:  11/20/18 71.7 kg   Ideal Body Weight:  59.1 kg  BMI:  Body mass index is 25.5 kg/m.  Estimated Nutritional Needs:   Kcal:  1630-1905  (MSJ x 1.2-1.4)  Protein:  95-120 grams (1.2-1.5 grams/kg)  Fluid:  2-2.3 L/day (25-30 mL/kg)  Koleen Distance MS, RD, LDN Pager #- [phone] Office#- [phone] After Hours Pager: [phone]

## 2018-11-20 NOTE — Plan of Care (Signed)
  Problem: Clinical Measurements: Goal: Diagnostic test results will improve Outcome: Progressing Goal: Respiratory complications will improve Outcome: Progressing   Problem: Safety: Goal: Ability to remain free from injury will improve Outcome: Progressing   

## 2018-11-20 NOTE — Progress Notes (Signed)
[name]    MR#:  [id number]  DATE OF BIRTH:  1956-06-17  SUBJECTIVE:    Refused meds this am Alert and oriented   REVIEW OF SYSTEMS:    Review of Systems  Constitutional: Negative.  Negative for chills, fever and malaise/fatigue.  HENT: Negative.  Negative for ear discharge, ear pain, hearing loss, nosebleeds and sore throat.   Eyes: Negative.  Negative for blurred vision and pain.  Respiratory: Negative.  Negative for cough, hemoptysis, shortness of breath and wheezing.   Cardiovascular: Negative.  Negative for chest pain, palpitations and leg swelling.  Gastrointestinal: Negative.  Negative for abdominal pain, blood in stool, diarrhea, nausea and vomiting.  Genitourinary: Negative.  Negative for dysuria.  Musculoskeletal: Negative.  Negative for back pain.  Skin: Negative.   Neurological: Negative for dizziness, tremors, speech change, focal weakness, seizures and headaches.  Endo/Heme/Allergies: Negative.  Does not bruise/bleed easily.  Psychiatric/Behavioral: Negative.  Negative for hallucinations and suicidal ideas.     Tolerating Diet:  yes     DRUG ALLERGIES:   Allergies  Allergen Reactions  . Erythromycin Base Swelling    Erythromycin ophthalmic ointment    VITALS:  Blood pressure (!) 164/87, pulse 95, temperature 98.1 F (36.7 C), resp. rate 20, height 5\' 6"  (1.676 m), weight 71.7 kg, SpO2 93 %.  PHYSICAL EXAMINATION:  Constitutional: Appears well-developed and well-nourished. No distress. Still whispering  HENT: Normocephalic. .   Eyes: Conjunctivae and EOM are normal. PERRLA, no scleral icterus.  Neck: Normal ROM. Neck supple. No JVD. No tracheal deviation. CVS: RRR, S1/S2 +, no murmurs, no gallops, no carotid bruit.  Pulmonary: CTAB no stridor, rhonchi, wheezes, rales.  Abdominal: Soft. BS +,  no distension, tenderness, rebound or guarding.  Musculoskeletal: Normal range of  motion. No edema and no tenderness.  Neuro: No focal deficit.  Follows all commands skin: Skin is warm and dry. No rash noted.      LABORATORY PANEL:   CBC Recent Labs  Lab 11/18/18 0405  WBC 10.2  HGB 10.7*  HCT 32.2*  PLT 529*   ------------------------------------------------------------------------------------------------------------------  Chemistries  Recent Labs  Lab 11/15/18 0547  11/20/18 0731  NA 137   < > 132*  K 3.1*   < > 3.6  CL 104   < > 98  CO2 23   < > 23  GLUCOSE 161*   < > 131*  BUN 10   < > 10  CREATININE 0.44   < > 0.33*  CALCIUM 7.9*   < > 8.9  MG 1.9   < > 1.9  AST 29  --   --   ALT 35  --   --   ALKPHOS 50  --   --   BILITOT 0.8  --   --    < > = values in this interval not displayed.   ------------------------------------------------------------------------------------------------------------------  Cardiac Enzymes No results for input(s): TROPONINI in the last 168 hours. ------------------------------------------------------------------------------------------------------------------  RADIOLOGY:  Dg Abd 1 View  Result Date: 11/19/2018 CLINICAL DATA:  Nausea and vomiting. EXAM: ABDOMEN - 1 VIEW COMPARISON:  None. FINDINGS: The bowel gas pattern is normal. No radio-opaque calculi or other significant radiographic abnormality are seen. IMPRESSION: Negative one view abdomen. Electronically Signed   By: San Morelle M.D.   On: 11/19/2018 08:58     ASSESSMENT AND PLAN:   63 year old female with a history of anxiety who presented to the ER  due to intractable nausea, vomiting and malaise.  She was found to have new bigeminy while in the emergency room and hypotension.  1.  Hypovolemic/septic shock: Patient was in the ICU.  Vasopressors and IVF were used to keep map greater than 65.    This has resolved.  2.  Sepsis from aspiration pneumonia: Sepsis has resolved. Patient evaluated by ID.  She was placed on broad-spectrum  antibiotics for aspiration pneumonia.  Blood cultures have remained negative and MRSA PCR was negative as well.  She was ruled out for COVID-19.   3.  Severe acute hypoxic and hypercapnic respiratory failure from aspiration pneumonia and septic shock: Patient was intubated on the day of admission and subsequently extubated on April 10.  She was then placed on BiPAP and is now on nasal cannula. Wean to room air as tolerated.  4.  Nausea, vomiting and malaise: This was patient's initial complaint and was due to acute gastroenteritis and has subsequently resolved. Advance to soft diet today  5.  Acute encephalopathy: This is be due to ICU delirium and sedatives given while patient was intubated. She is showing signs of continued   improvement in her mental status. Discussed with her that she needs to take her medications  6 History of malignant melanoma of the eye   7.  Nutrition: Speech consultant to reevaluate patient diet today.   8 History of anxiety: She will need to  continue Wellbutrin and Effexor   9  Hypokalemia: This is been repleted by pharmacy PRN.  10 Hyponatremia from decreaed PO intake Advance diet D/c d5 and start NS   11. HTN continue metoprolol Physical therapy has recommended skilled nursing facility upon discharge. Patient's husband is agreeable to this as it is patient.  Discussed with patient's husband  Management plans discussed with patient.  D/w nursing  CODE STATUS: full  TOTAL TIME TAKING CARE OF THIS PATIENT: 25 minutes.    POSSIBLE D/C tomorrow DEPENDING ON CLINICAL CONDITION.   Bettey Costa M.D on 11/20/2018 at 11:29 AM  Between 7am to 6pm - Pager - [phone] After 6pm go to [email or web address] - password [email or web address] [facility]  Office  [phone]  CC: Primary care physician; Katheren Shams  Note: This dictation was prepared with Dragon dictation along with smaller phrase technology. Any transcriptional errors  that result from this process are unintentional.

## 2018-11-20 NOTE — Progress Notes (Signed)
Occupational Therapy Treatment Patient Details Name: Cindy Morrison MRN: [id number] DOB: 24-Sep-1955 Today's Date: 11/20/2018    History of present illness Pt is a 63 y.o. female presenting to hospital 11/01/18 with N/V, midline back pain, and cough.  Pt admitted with acute hypoxic respiratory failure, acute gastroenteritis, cardiac and renal failure, and sepsis.  Pt intubated 11/01/18; extubated and re-intubated 11/04/18, and extubated 11/13/18.  Agitation noted during hospitalization.  PMH includes anxiety, CA, psoriasis, R biceps tenodesis and RCR 12/18/17; (+) smoking.   OT comments  Pt seen for OT/PT co-tx this date. Upon OT's arrival, pt seated EOB with legs crossed and no caregivers in room. OT adjusted positioning of bed to improve pt's sitting balance with pt able to self correct sitting balance with minimal cues. Bed noted to be wet and soiled. Max A to don socks. Pt required MOD A for gown change with simple cues for 1 step commands provided with pt able to perform with additional time. Pt required +2 for mod-max assist transfers EOB to facilitate bed linens change and pericare. Pt BLE noted to near buckle at times 2/2 fatigue. MOD A x2 for sit>supine. RN applied new pink foam sacral pad in bed with pt rolled onto side. Pt continues to benefit from skilled OT services to maximize return to PLOF and minimize falls risk and caregiver burden.   Follow Up Recommendations  SNF    Equipment Recommendations  Other (comment)(TBD)    Recommendations for Other Services      Precautions / Restrictions Precautions Precautions: Fall Restrictions Weight Bearing Restrictions: No       Mobility Bed Mobility Overal bed mobility: Needs Assistance Bed Mobility: Sit to Supine;Rolling Rolling: Mod assist     Sit to supine: Mod assist;+2 for physical assistance   General bed mobility comments: OT found pt seated EOB with legs crossed meal tray in front of her and HOB/foot of bed elevated. Unsure  how long pt was sitting up. To get back to bed at end of session, pt required Mod A x2, total x2 for scooting up in bed  Transfers Overall transfer level: Needs assistance Equipment used: Rolling walker (2 wheeled) Transfers: Sit to/from Stand Sit to Stand: +2 physical assistance;Max assist;Mod assist         General transfer comment: sit <> stand with mod-max A +2 standing at each side from/to elevated bed to RW. Failed to fully extend knees and hips. Required manual assist to place hands on/off RW. Backward lean at times.     Balance Overall balance assessment: Needs assistance Sitting-balance support: Bilateral upper extremity supported;Feet supported Sitting balance-Leahy Scale: Fair Sitting balance - Comments: occasional tactile cues for sitting balance     Standing balance-Leahy Scale: Poor Standing balance comment: Standing at edge of bed for 2x15min with mod A -max A+2, varying ability to maintain balance. Required consistent cuing to extend hips and knees and unable to do so fully.                            ADL either performed or assessed with clinical judgement   ADL Overall ADL's : Needs assistance/impaired Eating/Feeding: Set up;Maximal assistance;Bed level   Grooming: Set up;Maximal assistance;Bed level           Upper Body Dressing : Sitting;Moderate assistance;Cueing for sequencing Upper Body Dressing Details (indicate cue type and reason): seated EOB, pt doffed/donned hospital gown with Mod A and cues for sequencing  Toilet Transfer: RW;+2 for physical assistance;Maximal assistance;Moderate assistance;Cueing for sequencing;Cueing for safety   Toileting- Clothing Manipulation and Hygiene: Total assistance;Sit to/from stand Toileting - Clothing Manipulation Details (indicate cue type and reason): with mod-max assist from OT/PT, RN provided total assist for pericare from standing EOB, pt unable to let go of RW to assist             Vision  Patient Visual Report: (pt unable to answer)     Perception     Praxis      Cognition Arousal/Alertness: Awake/alert Behavior During Therapy: Flat affect Overall Cognitive Status: No family/caregiver present to determine baseline cognitive functioning                                 General Comments: pt answers simple yes/no questions, limited verbalization, follows simple commands with cues        Exercises Other Exercises Other Exercises: STS transfers for toileting hygiene, seated EOB UB dressing   Shoulder Instructions       General Comments      Pertinent Vitals/ Pain       Pain Assessment: No/denies pain Faces Pain Scale: No hurt Pain Intervention(s): Limited activity within patient's tolerance;Monitored during session;Repositioned  Home Living                                          Prior Functioning/Environment              Frequency  Min 1X/week        Progress Toward Goals  OT Goals(current goals can now be found in the care plan section)  Progress towards OT goals: Progressing toward goals  Acute Rehab OT Goals Patient Stated Goal: pt did not state any goals when asked  Plan Discharge plan remains appropriate;Frequency remains appropriate    Co-evaluation    PT/OT/SLP Co-Evaluation/Treatment: Yes Reason for Co-Treatment: For patient/therapist safety;To address functional/ADL transfers;Necessary to address cognition/behavior during functional activity PT goals addressed during session: Mobility/safety with mobility;Balance;Proper use of DME OT goals addressed during session: ADL's and self-care;Proper use of Adaptive equipment and DME      AM-PAC OT "6 Clicks" Daily Activity     Outcome Measure   Help from another person eating meals?: A Lot Help from another person taking care of personal grooming?: A Lot Help from another person toileting, which includes using toliet, bedpan, or urinal?: A Lot Help  from another person bathing (including washing, rinsing, drying)?: A Lot Help from another person to put on and taking off regular upper body clothing?: A Lot Help from another person to put on and taking off regular lower body clothing?: A Lot 6 Click Score: 12    End of Session Equipment Utilized During Treatment: Gait belt  OT Visit Diagnosis: Other abnormalities of gait and mobility (R26.89);Muscle weakness (generalized) (M62.81);Feeding difficulties (R63.3)   Activity Tolerance Patient tolerated treatment well   Patient Left in bed;with call bell/phone within reach;with bed alarm set   Nurse Communication Mobility status;Other (comment)(pt's status upon OT's arrival)        Time: 1339-1411 OT Time Calculation (min): 32 min  Charges: OT General Charges $OT Visit: 1 Visit OT Treatments $Self Care/Home Management : 8-22 mins  Jeni Salles, MPH, MS, OTR/L ascom [phone] 11/20/18, 3:37 PM

## 2018-11-20 NOTE — Progress Notes (Signed)
Attempted to give patient PO meds this AM.  Unable to encourage patient to take her meds.  Patient continues to refuse.  This RN and [facility] RN spoke to patient to take her meds and why this is important but patient continues to refuse.  Dr. Benjie Karvonen made aware.

## 2018-11-21 LAB — BASIC METABOLIC PANEL
Anion gap: 11 (ref 5–15)
BUN: 12 mg/dL (ref 8–23)
CO2: 21 mmol/L — ABNORMAL LOW (ref 22–32)
Calcium: 8.8 mg/dL — ABNORMAL LOW (ref 8.9–10.3)
Chloride: 102 mmol/L (ref 98–111)
Creatinine, Ser: 0.42 mg/dL — ABNORMAL LOW (ref 0.44–1.00)
GFR calc Af Amer: >60 mL/min (ref >60)
GFR calc non Af Amer: >60 mL/min (ref >60)
Glucose, Bld: 109 mg/dL — ABNORMAL HIGH (ref 70–99)
Potassium: 3.3 mmol/L — ABNORMAL LOW (ref 3.5–5.1)
Sodium: 134 mmol/L — ABNORMAL LOW (ref 135–145)

## 2018-11-21 LAB — CBC
HCT: 34.7 % — ABNORMAL LOW (ref 36.0–46.0)
Hemoglobin: 11.8 g/dL — ABNORMAL LOW (ref 12.0–15.0)
MCH: 30.6 pg (ref 26.0–34.0)
MCHC: 34 g/dL (ref 30.0–36.0)
MCV: 90.1 fL (ref 80.0–100.0)
Platelets: 620 10*3/uL — ABNORMAL HIGH (ref 150–400)
RBC: 3.85 MIL/uL — ABNORMAL LOW (ref 3.87–5.11)
RDW: 13 % (ref 11.5–15.5)
WBC: 9.8 10*3/uL (ref 4.0–10.5)
nRBC: 0 % (ref 0.0–0.2)

## 2018-11-21 LAB — GLUCOSE, CAPILLARY
Glucose-Capillary: 113 mg/dL — ABNORMAL HIGH (ref 70–99)
Glucose-Capillary: 119 mg/dL — ABNORMAL HIGH (ref 70–99)
Glucose-Capillary: 98 mg/dL (ref 70–99)

## 2018-11-21 LAB — MAGNESIUM: Magnesium: 1.9 mg/dL (ref 1.7–2.4)

## 2018-11-21 MED ORDER — SODIUM CHLORIDE 0.9 % IV SOLN
INTRAVENOUS | Status: DC
  Administered 2018-11-21 – 2018-11-22 (×2): via INTRAVENOUS

## 2018-11-21 MED ORDER — IPRATROPIUM-ALBUTEROL 0.5-2.5 (3) MG/3ML IN SOLN
3.0000 mL | Freq: Two times a day (BID) | RESPIRATORY_TRACT | Status: DC
  Administered 2018-11-21 – 2018-11-22 (×2): 3 mL via RESPIRATORY_TRACT
  Filled 2018-11-21 (×2): qty 3

## 2018-11-21 MED ORDER — RISPERIDONE 1 MG PO TBDP
0.5000 mg | ORAL_TABLET | Freq: Two times a day (BID) | ORAL | Status: DC
  Administered 2018-11-21: 21:00:00 0.5 mg via ORAL
  Filled 2018-11-21 (×3): qty 0.5

## 2018-11-21 MED ORDER — POTASSIUM CHLORIDE CRYS ER 20 MEQ PO TBCR
20.0000 meq | EXTENDED_RELEASE_TABLET | Freq: Once | ORAL | Status: AC
  Administered 2018-11-21: 20 meq via ORAL
  Filled 2018-11-21: qty 1

## 2018-11-21 MED ORDER — IPRATROPIUM-ALBUTEROL 0.5-2.5 (3) MG/3ML IN SOLN: 3.0000 mL | Freq: Two times a day (BID) | RESPIRATORY_TRACT | Status: DC

## 2018-11-21 NOTE — Progress Notes (Signed)
Discussed with husband and Conservation officer, historic buildings.  Okay for patient's husband to come and see patient today.  He is thinking about taking her home as he thinks this may be better for her.

## 2018-11-21 NOTE — Consult Note (Signed)
[facility] Face-to-Face Psychiatry Consult   Reason for Consult: Psychiatric evaluation and medication recommendations Referring Physician: Bettey Costa Md Patient Identification: Cindy Morrison MRN:  [id number] Principal Diagnosis: Delirium Diagnosis:  Active Problems:   Intractable nausea and vomiting   Septic shock ([place])   Acute respiratory failure with hypoxia ([place]) History of Anxiety  Total Time spent with patient: 30 minutes  Chief Complaint: "yes"  HPI:  Cindy Morrison is a 63 y.o. female patient admitted for the work-up of nausea vomiting, midline back pain and cough.  Developed acute hypoxic respiratory failure, acute gastroenteritis, cardiac and renal failure and septic shock.  Ruled out for COVID-19. she was intubated on 11/01/2018 and eventually extubated on 11/13/2018.  Agitation has been noted throughout her hospitalization.  He has been dealing with prolonged encephalopathy to be related to the medications given while intubated.  She has been nonverbal for several days, she was consulted to assist with medication management.  Noted she has missed several doses of her psychiatric medications due to refusal.  This does not appear to be anxiety related, doubt that Wellbutrin and Effexor are needed at this period of time.  On interview the patient was alert and awake laying in her room under the covers, she was briefly verbal.  He was capable of exchanging greeting, after the P[place] Curia introduced himself she accused me of "not looking like a doctor".  After which she became silent, and only responded with nodding her head yes and no or whispers that were inaudible.  She did not appear to understand where she was, the date, and what is unable to repeat her name.  She spent of the rest of the interview with her eyes averted to the handrail. Unfortunately she was unable to participate with a full psychiatric review of systems, or cognitive testing.   Past Psychiatric History: Per chart anxiety, she was  unable to elaborate on any other details during this evaluation  Past Medical History:  Past Medical History:  Diagnosis Date  . Anxiety   . Cancer ([place])    choroid melanoma left eye  . GERD (gastroesophageal reflux disease)   . Psoriasis     Past Surgical History:  Procedure Laterality Date  . ABDOMINAL HYSTERECTOMY    . BICEPT TENODESIS Right 12/18/2017   Procedure: BICEPS TENODESIS;  Surgeon: Leim Fabry, MD;  Location: [facility] ORS;  Service: Orthopedics;  Laterality: Right;  . BRAIN TUMOR EXCISION  2007  . COLONOSCOPY    . ESOPHAGOGASTRODUODENOSCOPY    . EYE SURGERY  2012  . MOHS SURGERY  2018  . SHOULDER ARTHROSCOPY WITH OPEN ROTATOR CUFF REPAIR Right 12/18/2017   Procedure: SHOULDER ARTHROSCOPY WITH OPEN ROTATOR CUFF REPAIR,DISTAL CLAVICLE EXCISION,SUBACROMIAL DECOMPRESSION;  Surgeon: Leim Fabry, MD;  Location: [facility] ORS;  Service: Orthopedics;  Laterality: Right;   Family History:  Family History  Problem Relation Age of Onset  . Heart attack Father   . Lung cancer Father    Family Psychiatric  History: Unknown Social History:  Social History   Substance and Sexual Activity  Alcohol Use Yes  . Frequency: Never   Comment: occasionally     Social History   Substance and Sexual Activity  Drug Use Never    Social History   Socioeconomic History  . Marital status: Married    Spouse name: Not on file  . Number of children: Not on file  . Years of education: Not on file  . Highest education level: Not on file  Occupational History  .  Not on file  Social Needs  . Financial resource strain: Not on file  . Food insecurity:    Worry: Not on file    Inability: Not on file  . Transportation needs:    Medical: Not on file    Non-medical: Not on file  Tobacco Use  . Smoking status: Current Every Day Smoker    Packs/day: 1.00  . Smokeless tobacco: Never Used  Substance and Sexual Activity  . Alcohol use: Yes    Frequency: Never    Comment: occasionally  .  Drug use: Never  . Sexual activity: Not on file  Lifestyle  . Physical activity:    Days per week: Not on file    Minutes per session: Not on file  . Stress: Not on file  Relationships  . Social connections:    Talks on phone: Not on file    Gets together: Not on file    Attends religious service: Not on file    Active member of club or organization: Not on file    Attends meetings of clubs or organizations: Not on file    Relationship status: Not on file  Other Topics Concern  . Not on file  Social History Narrative  . Not on file   Additional Social History:    Allergies:   Allergies  Allergen Reactions  . Erythromycin Base Swelling    Erythromycin ophthalmic ointment    Labs:  Results for orders placed or performed during the hospital encounter of 11/01/18 (from the past 48 hour(s))  Glucose, capillary     Status: Abnormal   Collection Time: 11/19/18  5:01 PM  Result Value Ref Range   Glucose-Capillary 109 (H) 70 - 99 mg/dL  Glucose, capillary     Status: Abnormal   Collection Time: 11/19/18 11:36 PM  Result Value Ref Range   Glucose-Capillary 123 (H) 70 - 99 mg/dL  Basic metabolic panel     Status: Abnormal   Collection Time: 11/20/18  7:31 AM  Result Value Ref Range   Sodium 132 (L) 135 - 145 mmol/L   Potassium 3.6 3.5 - 5.1 mmol/L   Chloride 98 98 - 111 mmol/L   CO2 23 22 - 32 mmol/L   Glucose, Bld 131 (H) 70 - 99 mg/dL   BUN 10 8 - 23 mg/dL   Creatinine, Ser 0.33 (L) 0.44 - 1.00 mg/dL   Calcium 8.9 8.9 - 10.3 mg/dL   GFR calc non Af Amer >60 >60 mL/min   GFR calc Af Amer >60 >60 mL/min   Anion gap 11 5 - 15    Comment: Performed at [facility], [address], [place], [place] [address]  Magnesium     Status: None   Collection Time: 11/20/18  7:31 AM  Result Value Ref Range   Magnesium 1.9 1.7 - 2.4 mg/dL    Comment: Performed at [facility], [address] [place], [place] [address]  Glucose, capillary     Status: Abnormal    Collection Time: 11/20/18  8:17 AM  Result Value Ref Range   Glucose-Capillary 139 (H) 70 - 99 mg/dL  Glucose, capillary     Status: None   Collection Time: 11/20/18  5:00 PM  Result Value Ref Range   Glucose-Capillary 97 70 - 99 mg/dL  Basic metabolic panel     Status: Abnormal   Collection Time: 11/21/18  4:55 AM  Result Value Ref Range   Sodium 134 (L) 135 - 145 mmol/L  Potassium 3.3 (L) 3.5 - 5.1 mmol/L   Chloride 102 98 - 111 mmol/L   CO2 21 (L) 22 - 32 mmol/L   Glucose, Bld 109 (H) 70 - 99 mg/dL   BUN 12 8 - 23 mg/dL   Creatinine, Ser 0.42 (L) 0.44 - 1.00 mg/dL   Calcium 8.8 (L) 8.9 - 10.3 mg/dL   GFR calc non Af Amer >60 >60 mL/min   GFR calc Af Amer >60 >60 mL/min   Anion gap 11 5 - 15    Comment: Performed at [facility], [address] [place], [place] [address]  Magnesium     Status: None   Collection Time: 11/21/18  4:55 AM  Result Value Ref Range   Magnesium 1.9 1.7 - 2.4 mg/dL    Comment: Performed at [facility], [address] [place], [place] [address]  CBC     Status: Abnormal   Collection Time: 11/21/18  4:55 AM  Result Value Ref Range   WBC 9.8 4.0 - 10.5 K/uL   RBC 3.85 (L) 3.87 - 5.11 MIL/uL   Hemoglobin 11.8 (L) 12.0 - 15.0 g/dL   HCT 34.7 (L) 36.0 - 46.0 %   MCV 90.1 80.0 - 100.0 fL   MCH 30.6 26.0 - 34.0 pg   MCHC 34.0 30.0 - 36.0 g/dL   RDW 13.0 11.5 - 15.5 %   Platelets 620 (H) 150 - 400 K/uL   nRBC 0.0 0.0 - 0.2 %    Comment: Performed at [facility], [address] [place], [place] [address]  Glucose, capillary     Status: Abnormal   Collection Time: 11/21/18  7:41 AM  Result Value Ref Range   Glucose-Capillary 119 (H) 70 - 99 mg/dL    Current Facility-Administered Medications  Medication Dose Route Frequency Provider Last Rate Last Dose  . 0.9 %  sodium chloride infusion   Intravenous Continuous Bettey Costa, MD 75 mL/hr at 11/21/18 1457    . acetaminophen (TYLENOL) tablet 650 mg  650 mg Oral Q6H PRN  Wilhelmina Mcardle, MD   650 mg at 11/19/18 1956   Or  . acetaminophen (TYLENOL) suppository 650 mg  650 mg Rectal Q6H PRN Wilhelmina Mcardle, MD   650 mg at 11/13/18 1602  . aspirin EC tablet 81 mg  81 mg Oral Daily Bettey Costa, MD   81 mg at 11/19/18 0905  . bisacodyl (DULCOLAX) suppository 10 mg  10 mg Rectal Daily PRN Tukov-Yual, Magdalene S, NP   10 mg at 11/09/18 0951  . budesonide (PULMICORT) nebulizer solution 0.25 mg  0.25 mg Nebulization BID Wilhelmina Mcardle, MD   0.25 mg at 11/20/18 2019  . buPROPion [facility]) tablet 75 mg  75 mg Oral BID Bettey Costa, MD   75 mg at 11/20/18 2341  . enoxaparin (LOVENOX) injection 40 mg  40 mg Subcutaneous Q24H Ottie Glazier, MD   40 mg at 11/20/18 2342  . feeding supplement (ENSURE ENLIVE) (ENSURE ENLIVE) liquid 237 mL  237 mL Oral BID BM Mody, Sital, MD      . ipratropium-albuterol (DUONEB) 0.5-2.5 (3) MG/3ML nebulizer solution 3 mL  3 mL Nebulization Q6H Tukov-Yual, Magdalene S, NP   3 mL at 11/21/18 1417  . labetalol (NORMODYNE,TRANDATE) injection 20 mg  20 mg Intravenous Q3H PRN Mansy, Jan A, MD   20 mg at 11/18/18 5366  . LORazepam (ATIVAN) injection 0.5-1 mg  0.5-1 mg Intravenous Q4H PRN Awilda Bill, NP   1  mg at 11/16/18 0015  . megestrol (MEGACE) 400 MG/10ML suspension 300 mg  300 mg Oral BID Mody, Sital, MD      . metoprolol succinate (TOPROL-XL) 24 hr tablet 25 mg  25 mg Oral Daily Bettey Costa, MD   25 mg at 11/21/18 4562  . multivitamin with minerals tablet 1 tablet  1 tablet Oral Daily Mody, Sital, MD      . nicotine (NICODERM CQ - dosed in mg/24 hours) patch 14 mg  14 mg Transdermal Daily Wilhelmina Mcardle, MD   14 mg at 11/19/18 0906  . ondansetron (ZOFRAN) injection 4 mg  4 mg Intravenous Q6H PRN Tukov-Yual, Magdalene S, NP   4 mg at 11/18/18 1657  . polyethylene glycol (MIRALAX / GLYCOLAX) packet 17 g  17 g Oral Daily PRN Sela Hua, MD   17 g at 11/10/18 2203  . sodium chloride flush (NS) 0.9 % injection 10-40 mL  10-40 mL  Intracatheter Q12H Gouru, Aruna, MD   10 mL at 11/20/18 1035  . sodium chloride flush (NS) 0.9 % injection 10-40 mL  10-40 mL Intracatheter PRN Gouru, Aruna, MD   10 mL at 11/17/18 0926  . venlafaxine XR (EFFEXOR-XR) 24 hr capsule 150 mg  150 mg Oral QPM Bettey Costa, MD   150 mg at 11/19/18 1956    Musculoskeletal: Strength & Muscle Tone: Unable to evaluate Gait & Station: Unable to evaluate Patient leans: Unable to evaluate   Psychiatric Specialty Exam: Physical Exam  Constitutional: She appears well-developed and well-nourished.  HENT:  Head: Normocephalic and atraumatic.  Respiratory: Effort normal.  Neurological: She is alert.    Review of Systems  Unable to perform ROS: Mental status change    Blood pressure (!) 166/82, pulse 83, temperature 98.1 F (36.7 C), temperature source Oral, resp. rate 18, height 5\' 6"  (1.676 m), weight 71.7 kg, SpO2 93 %.Body mass index is 25.5 kg/m.  General Appearance: Disheveled  Eye Contact:  Poor  Speech:  Minimal output, soft volume  Volume:  Decreased  Mood:  Not stated  Affect:  Confused, paranoid  Thought Process:  Disorganized  Orientation:  Other:  Does not appear to be oriented to self person place or situation  Thought Content:  Unable to assess fully, potentially paranoid ideations  Suicidal Thoughts:  Unable to assess  Homicidal Thoughts:  Unable to evaluate  Memory:  Unable to evaluate  Judgement:  Other:  Unable to evaluate  Insight:  Unable to evaluate  Psychomotor Activity:  Decreased  Concentration:  Concentration: Poor  Recall:  Unable to evaluate  Fund of Knowledge:  Unable to evaluate  Language:  Fair  Akathisia:  Unable to evaluate  Handed:  Right  AIMS (if indicated):     Assets:  Financial Resources/Insurance Intimacy Social Support  ADL's:  Unable to evaluate, currently impaired  Cognition: Impaired due to delirium  Sleep:        Treatment Plan Summary: Daily contact with patient to assess and evaluate  symptoms and progress in treatment, Medication management and Plan As follows   -will discontinue her Wellbutrin and Effexor, doubt that this is secondary to uncontrolled anxiety/depression.  May be able to restart these medications once she is more cognitively clear. -will start on low-dose Risperdal 0.5 mg p.o. twice daily to aid with her delirium, would also consider Zyprexa but will avoid for now considering her recent respiratory issues -Recommend avoiding the benzodiazepines at this time as they may exacerbate delirium   Thank  you for this consultation, free to contact the psychiatric consult team for further information or questions if needed  Disposition: To be determined, noted that the primary team is considering having the patient go home and follow-up outpatient  Chong Sicilian, DO 11/21/2018 4:52 PM

## 2018-11-21 NOTE — Progress Notes (Signed)
[place] [facility] [facility] [place] NAME: Cindy Morrison    MR#:  [id number]  DATE OF BIRTH:  1955-12-07  SUBJECTIVE:    Took meds still does not talk much only wants to nod her head Denies sore throat, follows simple commands  Took meds this am per nursing REVIEW OF SYSTEMS:    Review of Systems  Constitutional: Negative.  Negative for chills, fever and malaise/fatigue.  HENT: Negative.  Negative for ear discharge, ear pain, hearing loss, nosebleeds and sore throat.   Eyes: Negative.  Negative for blurred vision and pain.  Respiratory: Negative.  Negative for cough, hemoptysis, shortness of breath and wheezing.   Cardiovascular: Negative.  Negative for chest pain, palpitations and leg swelling.  Gastrointestinal: Negative.  Negative for abdominal pain, blood in stool, diarrhea, nausea and vomiting.  Genitourinary: Negative.  Negative for dysuria.  Musculoskeletal: Negative.  Negative for back pain.  Skin: Negative.   Neurological: Negative for dizziness, tremors, speech change, focal weakness, seizures and headaches.  Endo/Heme/Allergies: Negative.  Does not bruise/bleed easily.  Psychiatric/Behavioral: Negative.  Negative for hallucinations and suicidal ideas.     Tolerating Diet:  yes     DRUG ALLERGIES:   Allergies  Allergen Reactions  . Erythromycin Base Swelling    Erythromycin ophthalmic ointment    VITALS:  Blood pressure (!) 166/82, pulse 91, temperature 98.1 F (36.7 C), temperature source Oral, resp. rate 19, height 5\' 6"  (1.676 m), weight 71.7 kg, SpO2 94 %.  PHYSICAL EXAMINATION:  Constitutional: Appears well-developed and well-nourished. No distress. Still whispering  HENT: Normocephalic. .   Eyes: Conjunctivae and EOM are normal. PERRLA, no scleral icterus.  Neck: Normal ROM. Neck supple. No JVD. No tracheal deviation. CVS: RRR, S1/S2 +, no murmurs, no gallops, no carotid bruit.  Pulmonary: CTAB no stridor, rhonchi,  wheezes, rales.  Abdominal: Soft. BS +,  no distension, tenderness, rebound or guarding.  Musculoskeletal: Normal range of motion. No edema and no tenderness.  Neuro: No focal deficit.  Follows all commands skin: Skin is warm and dry. No rash noted.      LABORATORY PANEL:   CBC Recent Labs  Lab 11/21/18 0455  WBC 9.8  HGB 11.8*  HCT 34.7*  PLT 620*   ------------------------------------------------------------------------------------------------------------------  Chemistries  Recent Labs  Lab 11/15/18 0547  11/21/18 0455  NA 137   < > 134*  K 3.1*   < > 3.3*  CL 104   < > 102  CO2 23   < > 21*  GLUCOSE 161*   < > 109*  BUN 10   < > 12  CREATININE 0.44   < > 0.42*  CALCIUM 7.9*   < > 8.8*  MG 1.9   < > 1.9  AST 29  --   --   ALT 35  --   --   ALKPHOS 50  --   --   BILITOT 0.8  --   --    < > = values in this interval not displayed.   ------------------------------------------------------------------------------------------------------------------  Cardiac Enzymes No results for input(s): TROPONINI in the last 168 hours. ------------------------------------------------------------------------------------------------------------------  RADIOLOGY:  No results found.   ASSESSMENT AND PLAN:   63 year old female with a history of anxiety who presented to the ER due to intractable nausea, vomiting and malaise.  She was found to have new bigeminy while in the emergency room and hypotension.  1.  Hypovolemic/septic shock: Patient was in the ICU.  Vasopressors and IVF were  used to keep map greater than 65.    This has resolved.  2.  Sepsis from aspiration pneumonia: Sepsis has resolved. Patient evaluated by ID.  She was placed on broad-spectrum antibiotics for aspiration pneumonia.  Blood cultures have remained negative and MRSA PCR was negative as well.  She was ruled out for COVID-19.   3.  Severe acute hypoxic and hypercapnic respiratory failure from  aspiration pneumonia and septic shock: Patient was intubated on the day of admission and subsequently extubated on April 10.  She was then placed on BiPAP and is now on nasal cannula. Wean to room air as tolerated.  4.  Nausea, vomiting and malaise: This was patient's initial complaint and was due to acute gastroenteritis and has subsequently resolved. Advance to soft diet today  5.  Acute encephalopathy: This is be due to ICU delirium and sedatives given while patient was intubated. She does not engage herself in [place] conversation, onlynods when she wants to. Will obtain [facility] consult and placed via Epic to dr Tamala Julian. . Discussed with her that she needs to take her medications  6 History of malignant melanoma of the eye   7.  Nutrition: Continue diet and appetite stimulant.   8 History of anxiety: Only on Wellbutrin and Effexor.  Psychiatry consultation placed.   9  Hypokalemia: This is been repleted by pharmacy PRN.  10 Hyponatremia from decreased PO intake improved  11. HTN continue metoprolol Physical therapy has recommended skilled nursing facility upon discharge. Patient's husband is agreeable to this as it is patient.  Discussed with patient's husband  Management plans discussed with patient.  D/w nursing  CODE STATUS: full  TOTAL TIME TAKING CARE OF THIS PATIENT: 25 minutes.    POSSIBLE D/C tomorrow DEPENDING ON CLINICAL CONDITION.   Bettey Costa M.D on 11/21/2018 at 11:30 AM  Between 7am to 6pm - Pager - [phone] After 6pm go to [email or web address] - password [email or web address] [facility]  Office  [phone]  CC: Primary care physician; Katheren Shams  Note: This dictation was prepared with Dragon dictation along with smaller phrase technology. Any transcriptional errors that result from this process are unintentional.

## 2018-11-21 NOTE — Plan of Care (Signed)
  Problem: Clinical Measurements: Goal: Diagnostic test results will improve Outcome: Progressing   Problem: Activity: Goal: Risk for activity intolerance will decrease Outcome: Not Progressing Note:  Patient unable to ambulate. Patient very weak.

## 2018-11-21 NOTE — Progress Notes (Signed)
Pharmacy Electrolyte Monitoring Consult:  Pharmacy consulted to assist in monitoring and replacing electrolytes in this 63 y.o. female admitted on 11/01/2018 with Emesis   Labs:  Sodium (mmol/L)  Date Value  11/21/2018 134 (L)  03/16/2014 136   Potassium (mmol/L)  Date Value  11/21/2018 3.3 (L)  03/16/2014 3.8   Magnesium (mg/dL)  Date Value  11/21/2018 1.9   Phosphorus (mg/dL)  Date Value  11/19/2018 4.0   Calcium (mg/dL)  Date Value  11/21/2018 8.8 (L)   Calcium, Total (mg/dL)  Date Value  03/16/2014 9.0   Albumin (g/dL)  Date Value  11/15/2018 2.5 (L)  03/16/2014 3.8   Corrected Calcium: 9.6  Assessment/Plan: 4/18:  K: 3.3             Mg: 1.9  Will supplement with KCl 51mEq PO times one dose.  Will obtain BMP with am labs.   Pharmacy will continue to monitor and adjust per consult.    Paulina Fusi, PharmD, BCPS 11/21/2018 12:27 PM

## 2018-11-21 NOTE — Progress Notes (Signed)
Speech Language Pathology Treatment: Dysphagia  Patient Details Name: Cindy Morrison MRN: [id number] DOB: 1956-05-17 Today's Date: 11/21/2018 Time: 7322-0254 SLP Time Calculation (min) (ACUTE ONLY): 35 min  Assessment / Plan / Recommendation Clinical Impression  Pt seen for f/u w/ toleration of current diet consistency upgraded from clear liquids(N/V) to solids by MD; education on general aspiration precautions. Pt awakened easily, sitting in bed and nonverbal w/ SLP. She often looked confused and min agitated w/ the positioning upright in bed and prepping tray table for lunch meal tray. She continues to present w/ confusion w/ her environment, nonverbal to general questions re: herself, but she did shake her head "NO" when offered items including foods. NSG reported same presentation w/ her; refusal of oral medications often. Noted MD notes, labs.  Pt consumed 1 po trial of the thin liquid via cup but NO bites of grilled cheese sandwich or banana pudding despite verbal encouragement and prep of items/tray. No immediate, overt s/s of aspiration noted w/ the sip of tea; unable to assess vocal quality post trial. Pt was given moderate encouragement both verbal and visual (w/ min tactile) to entice pt to take bites/sips at lunch meal. Pt refused turning her head to all offerings. Discussed foods and consistency of foods that might be easier to eat/drink as well as tried to engage pt in describing foods she likes. Pt remained nonverbal w/ no interaction except to shake head "NO". When she seemed agitated by the continued interaction, SLP stopped and left tray in front of pt prepped to eat/drink. NSG updated and will monitor pt. Recommend continue w/ current diet as ordered w/ general aspiration precautions; monitoring at meals for support and setup as well as encouragement. ST services will continue to monitor pt's status for any needs while admitted. Recommend Dietician f/u for nutritional support. NSG  updated.     HPI HPI: Pt is a 63 y.o. female with a known history of anxiety, hx melanoma, GERD who presented to the ED with intractable nausea, vomiting, and malaise over the last couple of days.  She also endorsed some midline low back pain.  She states she is "just not feeling well".  She denies any abdominal pain, diarrhea, or constipation.  No chest pain, shortness of breath, fevers, chills.  Found to be hypoxic in the ED.  In ICU, pt became more agitated and hypoxic and required emergent intubation and placed on contact precautions high risk.  Nasopharyngeal swab for COVID sent and negative.  Patient also became febrile on 11/02/2018.  WBC went from 16.8 on 11/02/2018 to 4.6 on 11/03/2018 along with lymphopenia.  Pt agitated when attempting to wean from vent.  During aggitation with multiple attempts to self extubate, pt did extubated then required re-intubation post aspiration of copious secretions.  Pt was finally extubated on 11/13/2018.  She is tolerating extubation well but remains significantly encephalopathic and requiring medications for anxiety/agitation.        SLP Plan  Continue with current plan of care       Recommendations  Diet recommendations: Regular;Thin liquid(per upgrade by MD) Liquids provided via: Cup;Straw(monitor) Medication Administration: Whole meds with puree(if able to ) Supervision: Patient able to self feed;Staff to assist with self feeding;Full supervision/cueing for compensatory strategies Compensations: Minimize environmental distractions;Slow rate;Small sips/bites;Follow solids with liquid Postural Changes and/or Swallow Maneuvers: Seated upright 90 degrees;Upright 30-60 min after meal                General recommendations: (Dietician f/u) Oral Care  Recommendations: Oral care BID;Staff/trained caregiver to provide oral care Follow up Recommendations: Skilled Nursing facility(TBD) SLP Visit Diagnosis: Dysphagia, oropharyngeal phase (R13.12) Plan:  Continue with current plan of care       [place], [place], CCC-SLP Daxtin Leiker 11/21/2018, 1:57 PM

## 2018-11-22 LAB — BASIC METABOLIC PANEL
Anion gap: 11 (ref 5–15)
BUN: 10 mg/dL (ref 8–23)
CO2: 19 mmol/L — ABNORMAL LOW (ref 22–32)
Calcium: 8.6 mg/dL — ABNORMAL LOW (ref 8.9–10.3)
Chloride: 101 mmol/L (ref 98–111)
Creatinine, Ser: <0.3 mg/dL — ABNORMAL LOW (ref 0.44–1.00)
Glucose, Bld: 106 mg/dL — ABNORMAL HIGH (ref 70–99)
Potassium: 3.1 mmol/L — ABNORMAL LOW (ref 3.5–5.1)
Sodium: 131 mmol/L — ABNORMAL LOW (ref 135–145)

## 2018-11-22 MED ORDER — BUPROPION HCL 75 MG PO TABS: 75.0000 mg | ORAL_TABLET | Freq: Two times a day (BID) | ORAL | Status: DC

## 2018-11-22 MED ORDER — NICOTINE 14 MG/24HR TD PT24: 14.0000 mg | MEDICATED_PATCH | Freq: Every day | TRANSDERMAL | 0 refills | Status: DC

## 2018-11-22 MED ORDER — RISPERIDONE 0.5 MG PO TBDP: 0.5000 mg | ORAL_TABLET | Freq: Two times a day (BID) | ORAL | 0 refills | Status: DC

## 2018-11-22 MED ORDER — METOPROLOL SUCCINATE ER 25 MG PO TB24: 25.0000 mg | ORAL_TABLET | Freq: Every day | ORAL | 0 refills | Status: DC

## 2018-11-22 NOTE — Discharge Instructions (Signed)
Nausea and Vomiting, Adult  Nausea is the feeling that you have an upset stomach or that you are about to vomit. Vomiting is when stomach contents are thrown up and out of the mouth as a result of nausea. Vomiting can make you feel weak and cause you to become dehydrated.  Dehydration can make you feel tired and thirsty, cause you to have a dry mouth, and decrease how often you urinate. Older adults and people with other diseases or a weak disease-fighting system (immune system) are at higher risk for dehydration. It is important to treat your nausea and vomiting as told by your health care provider.  Follow these instructions at home:  Watch your symptoms for any changes. Tell your health care provider about them. Follow these instructions to care for yourself at home.  Eating and drinking          Take an oral rehydration solution (ORS). This is a drink that is sold at pharmacies and retail stores.   Drink clear fluids slowly and in small amounts as you are able. Clear fluids include water, ice chips, low-calorie sports drinks, and fruit juice that has water added (diluted fruit juice).   Eat bland, easy-to-digest foods in small amounts as you are able. These foods include bananas, applesauce, rice, lean meats, toast, and crackers.   Avoid fluids that contain a lot of sugar or caffeine, such as energy drinks, sports drinks, and soda.   Avoid alcohol.   Avoid spicy or fatty foods.  General instructions   Take over-the-counter and prescription medicines only as told by your health care provider.   Drink enough fluid to keep your urine pale yellow.   Wash your hands often using soap and water. If soap and water are not available, use hand sanitizer.   Make sure that all people in your household wash their hands well and often.   Rest at home while you recover.   Watch your condition for any changes.   Breathe slowly and deeply when you feel nauseated.   Keep all follow-up visits as told by your health  care provider. This is important.  Contact a health care provider if:   Your symptoms get worse.   You have new symptoms.   You have a fever.   You cannot drink fluids without vomiting.   Your nausea does not go away after 2 days.   You feel light-headed or dizzy.   You have a headache.   You have muscle cramps.   You have a rash.   You have pain while urinating.  Get help right away if:   You have pain in your chest, neck, arm, or jaw.   You feel extremely weak or you faint.   You have persistent vomiting.   You have vomit that is bright red or looks like black coffee grounds.   You have bloody or black stools or stools that look like tar.   You have a severe headache, a stiff neck, or both.   You have severe pain, cramping, or bloating in your abdomen.   You have difficulty breathing, or you are breathing very quickly.   Your heart is beating very quickly.   Your skin feels cold and clammy.   You feel confused.   You have signs of dehydration, such as:  ? Dark urine, very little urine, or no urine.  ? Cracked lips.  ? Dry mouth.  ? Sunken eyes.  ? Sleepiness.  ? Weakness.  These   symptoms may represent a serious problem that is an emergency. Do not wait to see if the symptoms will go away. Get medical help right away. Call your local emergency services (911 in the [place] Do not drive yourself to the hospital.  Summary   Nausea is the feeling that you have an upset stomach or that you are about to vomit. As nausea gets worse, it can lead to vomiting. Vomiting can make you feel weak and cause you to become dehydrated.   Follow instructions from your health care provider about eating and drinking to prevent dehydration.   Take over-the-counter and prescription medicines only as told by your health care provider.   Contact your health care provider if your symptoms get worse, or you have new symptoms.   Keep all follow-up visits as told by your health care provider. This is important.  This  information is not intended to replace advice given to you by your health care provider. Make sure you discuss any questions you have with your health care provider.  Document Released: 07/22/2005 Document Revised: 12/30/2017 Document Reviewed: 12/30/2017  Elsevier Interactive Patient Education  2019 [employer]

## 2018-11-22 NOTE — Consult Note (Signed)
[facility] : [facility] Face-to-Face Psychiatry Consult   Reason for Consult: Psychiatric evaluation and medication recommendations Referring Physician: Bettey Costa Md Patient Identification: Cindy Morrison MRN:  [id number] Principal Diagnosis: Delirium Diagnosis:  Active Problems:   Intractable nausea and vomiting   Septic shock ([place])   Acute respiratory failure with hypoxia ([place]) History of Anxiety  Total Time spent with patient: 15 minutes  Chief Complaint: "well I just can't say"  HPI:  Cindy Morrison is a 63 y.o. female patient admitted for the work-up of nausea vomiting, midline back pain and cough.  Developed acute hypoxic respiratory failure, acute gastroenteritis, cardiac and renal failure and septic shock.  Ruled out for COVID-19. she was intubated on 11/01/2018 and eventually extubated on 11/13/2018.  Agitation has been noted throughout her hospitalization.  She has been dealing with prolonged encephalopathy to be related to the medications given while intubated.  She has been nonverbal for several days, psychiatry was consulted to assist with medication management.  Noted she has missed several doses of her psychiatric medications due to refusal. She was placed on low dose risperdal yesterday to assist with her delirium, noted she did not take her morning dose.  On interview the patient was alert and awake laying in her room under the covers, she was somewhat more interactive compared to yesterday. However her verbal exchanges were completely nonsensical and occasionally inaudible. She was unable to cooperate with a basic interview, she did not follow commands appropriately, and when asked yes/no style questions she would answer differently depending on how the question was formulated. She did not appear to understand where she was, the date, and what is unable to repeat her name. She eventually covered herself up in blankets and stopped engaging with the interivew.  Summarily she was unable to participate  with a full psychiatric review of systems, or cognitive testing.   Past Psychiatric History: Per chart anxiety, she was unable to elaborate on any other details during this evaluation  Past Medical History:  Past Medical History:  Diagnosis Date  . Anxiety   . Cancer ([place])    choroid melanoma left eye  . GERD (gastroesophageal reflux disease)   . Psoriasis     Past Surgical History:  Procedure Laterality Date  . ABDOMINAL HYSTERECTOMY    . BICEPT TENODESIS Right 12/18/2017   Procedure: BICEPS TENODESIS;  Surgeon: Leim Fabry, MD;  Location: [facility] ORS;  Service: Orthopedics;  Laterality: Right;  . BRAIN TUMOR EXCISION  2007  . COLONOSCOPY    . ESOPHAGOGASTRODUODENOSCOPY    . EYE SURGERY  2012  . MOHS SURGERY  2018  . SHOULDER ARTHROSCOPY WITH OPEN ROTATOR CUFF REPAIR Right 12/18/2017   Procedure: SHOULDER ARTHROSCOPY WITH OPEN ROTATOR CUFF REPAIR,DISTAL CLAVICLE EXCISION,SUBACROMIAL DECOMPRESSION;  Surgeon: Leim Fabry, MD;  Location: [facility] ORS;  Service: Orthopedics;  Laterality: Right;   Family History:  Family History  Problem Relation Age of Onset  . Heart attack Father   . Lung cancer Father    Family Psychiatric  History: Unknown Social History:  Social History   Substance and Sexual Activity  Alcohol Use Yes  . Frequency: Never   Comment: occasionally     Social History   Substance and Sexual Activity  Drug Use Never    Social History   Socioeconomic History  . Marital status: Married    Spouse name: Not on file  . Number of children: Not on file  . Years of education: Not on file  . Highest education level: Not  on file  Occupational History  . Not on file  Social Needs  . Financial resource strain: Not on file  . Food insecurity:    Worry: Not on file    Inability: Not on file  . Transportation needs:    Medical: Not on file    Non-medical: Not on file  Tobacco Use  . Smoking status: Current Every Day Smoker    Packs/day: 1.00  . Smokeless  tobacco: Never Used  Substance and Sexual Activity  . Alcohol use: Yes    Frequency: Never    Comment: occasionally  . Drug use: Never  . Sexual activity: Not on file  Lifestyle  . Physical activity:    Days per week: Not on file    Minutes per session: Not on file  . Stress: Not on file  Relationships  . Social connections:    Talks on phone: Not on file    Gets together: Not on file    Attends religious service: Not on file    Active member of club or organization: Not on file    Attends meetings of clubs or organizations: Not on file    Relationship status: Not on file  Other Topics Concern  . Not on file  Social History Narrative  . Not on file   Additional Social History:    Allergies:   Allergies  Allergen Reactions  . Erythromycin Base Swelling    Erythromycin ophthalmic ointment    Labs:  Results for orders placed or performed during the hospital encounter of 11/01/18 (from the past 48 hour(s))  Glucose, capillary     Status: None   Collection Time: 11/20/18  5:00 PM  Result Value Ref Range   Glucose-Capillary 97 70 - 99 mg/dL  Basic metabolic panel     Status: Abnormal   Collection Time: 11/21/18  4:55 AM  Result Value Ref Range   Sodium 134 (L) 135 - 145 mmol/L   Potassium 3.3 (L) 3.5 - 5.1 mmol/L   Chloride 102 98 - 111 mmol/L   CO2 21 (L) 22 - 32 mmol/L   Glucose, Bld 109 (H) 70 - 99 mg/dL   BUN 12 8 - 23 mg/dL   Creatinine, Ser 0.42 (L) 0.44 - 1.00 mg/dL   Calcium 8.8 (L) 8.9 - 10.3 mg/dL   GFR calc non Af Amer >60 >60 mL/min   GFR calc Af Amer >60 >60 mL/min   Anion gap 11 5 - 15    Comment: Performed at [facility], [address] [place], [place] [address]  Magnesium     Status: None   Collection Time: 11/21/18  4:55 AM  Result Value Ref Range   Magnesium 1.9 1.7 - 2.4 mg/dL    Comment: Performed at [facility], [address] [place], [place] [address]  CBC     Status: Abnormal   Collection Time: 11/21/18  4:55 AM   Result Value Ref Range   WBC 9.8 4.0 - 10.5 K/uL   RBC 3.85 (L) 3.87 - 5.11 MIL/uL   Hemoglobin 11.8 (L) 12.0 - 15.0 g/dL   HCT 34.7 (L) 36.0 - 46.0 %   MCV 90.1 80.0 - 100.0 fL   MCH 30.6 26.0 - 34.0 pg   MCHC 34.0 30.0 - 36.0 g/dL   RDW 13.0 11.5 - 15.5 %   Platelets 620 (H) 150 - 400 K/uL   nRBC 0.0 0.0 - 0.2 %    Comment: Performed at [facility],  [place], [place] [address]  Glucose, capillary     Status: Abnormal   Collection Time: 11/21/18  7:41 AM  Result Value Ref Range   Glucose-Capillary 119 (H) 70 - 99 mg/dL  Glucose, capillary     Status: Abnormal   Collection Time: 11/21/18  6:11 PM  Result Value Ref Range   Glucose-Capillary 113 (H) 70 - 99 mg/dL  Glucose, capillary     Status: None   Collection Time: 11/21/18 11:56 PM  Result Value Ref Range   Glucose-Capillary 98 70 - 99 mg/dL  Basic metabolic panel     Status: Abnormal   Collection Time: 11/22/18  8:15 AM  Result Value Ref Range   Sodium 131 (L) 135 - 145 mmol/L   Potassium 3.1 (L) 3.5 - 5.1 mmol/L   Chloride 101 98 - 111 mmol/L   CO2 19 (L) 22 - 32 mmol/L   Glucose, Bld 106 (H) 70 - 99 mg/dL   BUN 10 8 - 23 mg/dL   Creatinine, Ser <0.30 (L) 0.44 - 1.00 mg/dL   Calcium 8.6 (L) 8.9 - 10.3 mg/dL   GFR calc non Af Amer NOT CALCULATED >60 mL/min   GFR calc Af Amer NOT CALCULATED >60 mL/min   Anion gap 11 5 - 15    Comment: Performed at [facility], [address] [place], [place] [address]    Current Facility-Administered Medications  Medication Dose Route Frequency Provider Last Rate Last Dose  . 0.9 %  sodium chloride infusion   Intravenous Continuous Bettey Costa, MD 75 mL/hr at 11/22/18 0456    . acetaminophen (TYLENOL) tablet 650 mg  650 mg Oral Q6H PRN Wilhelmina Mcardle, MD   650 mg at 11/19/18 1956   Or  . acetaminophen (TYLENOL) suppository 650 mg  650 mg Rectal Q6H PRN Wilhelmina Mcardle, MD   650 mg at 11/13/18 1602  . aspirin EC tablet 81 mg  81 mg Oral Daily  Bettey Costa, MD   81 mg at 11/19/18 0905  . bisacodyl (DULCOLAX) suppository 10 mg  10 mg Rectal Daily PRN Tukov-Yual, Magdalene S, NP   10 mg at 11/09/18 0951  . enoxaparin (LOVENOX) injection 40 mg  40 mg Subcutaneous Q24H Ottie Glazier, MD   40 mg at 11/21/18 2123  . feeding supplement (ENSURE ENLIVE) (ENSURE ENLIVE) liquid 237 mL  237 mL Oral BID BM Mody, Sital, MD      . labetalol (NORMODYNE,TRANDATE) injection 20 mg  20 mg Intravenous Q3H PRN Mansy, Jan A, MD   20 mg at 11/18/18 3762  . LORazepam (ATIVAN) injection 0.5-1 mg  0.5-1 mg Intravenous Q4H PRN Awilda Bill, NP   1 mg at 11/16/18 0015  . megestrol (MEGACE) 400 MG/10ML suspension 300 mg  300 mg Oral BID Mody, Sital, MD      . metoprolol succinate (TOPROL-XL) 24 hr tablet 25 mg  25 mg Oral Daily Bettey Costa, MD   25 mg at 11/21/18 8315  . multivitamin with minerals tablet 1 tablet  1 tablet Oral Daily Mody, Sital, MD      . nicotine (NICODERM CQ - dosed in mg/24 hours) patch 14 mg  14 mg Transdermal Daily Wilhelmina Mcardle, MD   14 mg at 11/22/18 0855  . ondansetron (ZOFRAN) injection 4 mg  4 mg Intravenous Q6H PRN Tukov-Yual, Magdalene S, NP   4 mg at 11/18/18 1657  . polyethylene glycol (MIRALAX / GLYCOLAX) packet 17 g  17 g  Oral Daily PRN Sela Hua, MD   17 g at 11/10/18 2203  . risperiDONE (RISPERDAL M-TABS) disintegrating tablet 0.5 mg  0.5 mg Oral BID Solon Palm R, DO   0.5 mg at 11/21/18 2122  . sodium chloride flush (NS) 0.9 % injection 10-40 mL  10-40 mL Intracatheter Q12H Gouru, Aruna, MD   10 mL at 11/22/18 0854  . sodium chloride flush (NS) 0.9 % injection 10-40 mL  10-40 mL Intracatheter PRN Gouru, Aruna, MD   10 mL at 11/17/18 1700    Musculoskeletal: Strength & Muscle Tone: Unable to evaluate Gait & Station: Unable to evaluate Patient leans: Unable to evaluate   Psychiatric Specialty Exam: Physical Exam  Constitutional: She appears well-developed and well-nourished.  HENT:  Head: Normocephalic and  atraumatic.  Respiratory: Effort normal.  Neurological: She is alert.    Review of Systems  Unable to perform ROS: Mental status change    Blood pressure (!) 159/85, pulse (!) 108, temperature 98.5 F (36.9 C), temperature source Oral, resp. rate 16, height 5\' 6"  (1.676 m), weight 69.8 kg, SpO2 97 %.Body mass index is 24.84 kg/m.  General Appearance: Disheveled  Eye Contact:  Poor  Speech:  Minimal output, soft volume  Volume:  Decreased  Mood:  Not stated  Affect:  Confused, paranoid  Thought Process:  Disorganized  Orientation:  Other:  Does not appear to be oriented to self person place or situation  Thought Content:  Unable to assess fully, potentially paranoid ideations  Suicidal Thoughts:  Unable to assess  Homicidal Thoughts:  Unable to evaluate  Memory:  Unable to evaluate  Judgement:  Other:  Unable to evaluate  Insight:  Unable to evaluate  Psychomotor Activity:  Decreased  Concentration:  Concentration: Poor  Recall:  Unable to evaluate  Fund of Knowledge:  Unable to evaluate  Language:  Fair  Akathisia:  Unable to evaluate  Handed:  Right  AIMS (if indicated):     Assets:  Financial Resources/Insurance Intimacy Social Support  ADL's:  Unable to evaluate, currently impaired  Cognition: Impaired due to delirium  Sleep:        Treatment Plan Summary: Daily contact with patient to assess and evaluate symptoms and progress in treatment, Medication management and Plan As follows   -discontinued her on 4/19 Wellbutrin and Effexor, doubt that this is secondary to uncontrolled anxiety/depression.  May be able to restart these medications once she is more cognitively clear. -Continue low-dose Risperdal 0.5 mg p.o. twice daily to aid with her delirium, may need to convert to a IM/IV medication if she continues to refuse medications -Recommend avoiding the benzodiazepines at this time as they may exacerbate delirium   Thank you for this consultation, free to contact  the psychiatric consult team for further information or questions if needed  Disposition: To be determined, noted that the primary team is considering having the patient go home and follow-up outpatient  Chong Sicilian, DO 11/22/2018 10:24 AM

## 2018-11-22 NOTE — Discharge Summary (Signed)
[facility] [place] NAME: Cindy Morrison    MR#:  [id number]  DATE OF BIRTH:  Apr 04, 1956  DATE OF ADMISSION:  11/01/2018 ADMITTING PHYSICIAN: Sela Hua, MD  DATE OF DISCHARGE: 11/22/2018  PRIMARY CARE PHYSICIAN: [facility], Jef[facility] Bryant    ADMISSION DIAGNOSIS:  Cough [R05] Bigeminy [I49.9] Hypoxia [R09.02] Hypotension, unspecified hypotension type [I95.9] Intractable nausea and vomiting [R11.2]  DISCHARGE DIAGNOSIS:  Active Problems:   Intractable nausea and vomiting   Septic shock (HCC)   Acute respiratory failure with hypoxia ([place])   SECONDARY DIAGNOSIS:   Past Medical History:  Diagnosis Date  . Anxiety   . Cancer ([place])    choroid melanoma left eye  . GERD (gastroesophageal reflux disease)   . Psoriasis     HOSPITAL COURSE:   63 year old female with a history of anxiety who presented to the ER due to intractable nausea, vomiting and malaise.  She was found to have new bigeminy while in the emergency room and hypotension.  1.  Hypovolemic/septic shock: Patient was in the ICU.  Vasopressors and IVF were used to keep map greater than 65.    This has resolved.  2.  Sepsis from aspiration pneumonia: Sepsis has resolved. Patient evaluated by ID.  She was placed on broad-spectrum antibiotics for aspiration pneumonia.  Blood cultures have remained negative and MRSA PCR was negative as well.  She was ruled out for COVID-19.   3.  Severe acute hypoxic and hypercapnic respiratory failure from aspiration pneumonia and septic shock: Patient was intubated on the day of admission and subsequently extubated on April 10.  She was then placed on BiPAP and weaned off of O2. .  4.  Nausea, vomiting and malaise: This was patient's initial complaint and was due to acute gastroenteritis and has subsequently resolved.   5.  Acute encephalopathy: This is be due to ICU delirium and sedatives given while patient was intubated. Due to  prolonged encephalopathy she was eval by psychiatry.  They recommend Risperdal.  She may restart her antidepressants once her mental status is more clear.  Patient follows all commands she has no neurological deficits she talks when she wants to.  She denies suicidal ideation.      6 History of malignant melanoma of the eye   7.  Nutrition: Continue diet and appetite stimulant.   8 History of anxiety:  Management as stated above 9  Hypokalemia: Was repleted  10 Hyponatremia from decreased PO intake This has resolved  11. HTN continue metoprolol  Sickle therapy has recommended skilled nursing facility upon discharge.  Patient's husband feels that patient would better be served at home.  He would like to take her home today.  He understands patient's mental state is not completely clear yet.   DISCHARGE CONDITIONS AND DIET:   Stable for discharge on regular diet  CONSULTS OBTAINED:    DRUG ALLERGIES:   Allergies  Allergen Reactions  . Erythromycin Base Swelling    Erythromycin ophthalmic ointment    DISCHARGE MEDICATIONS:   Allergies as of 11/22/2018      Reactions   Erythromycin Base Swelling   Erythromycin ophthalmic ointment      Medication List    STOP taking these medications   etodolac 400 MG tablet Commonly known as:  LODINE     TAKE these medications   acetaminophen 500 MG tablet Commonly known as:  TYLENOL Take 2 tablets (1,000 mg total) by mouth every 8 (eight) hours.  aspirin EC 81 MG tablet Take 81 mg by mouth daily.   betamethasone dipropionate 0.05 % ointment Commonly known as:  DIPROLENE Apply 1 application topically 2 (two) times daily as needed. For psoriasis   buPROPion 75 MG tablet Commonly known as:  WELLBUTRIN Take 1 tablet (75 mg total) by mouth 2 (two) times daily. May restart once she is more clear in her mental state. What changed:  additional instructions   CALCIUM 600+D3 PO Take 1 tablet by mouth 2 (two) times  daily.   cholecalciferol 25 MCG (1000 UT) tablet Commonly known as:  VITAMIN D Take 1,000 Units by mouth at bedtime.   Fish Oil 1200 MG Caps Take 1,200 mg by mouth 2 (two) times daily.   LORazepam 0.5 MG tablet Commonly known as:  ATIVAN Take 0.5 mg by mouth every 8 (eight) hours as needed for anxiety.   metoprolol succinate 25 MG 24 hr tablet Commonly known as:  TOPROL-XL Take 1 tablet (25 mg total) by mouth daily. Start taking on:  November 23, 2018   multivitamin with minerals Tabs tablet Take 1 tablet by mouth daily. Centrum Silver   nicotine 14 mg/24hr patch Commonly known as:  NICODERM CQ - dosed in mg/24 hours Place 1 patch (14 mg total) onto the skin daily. Start taking on:  November 23, 2018   omeprazole 40 MG capsule Commonly known as:  PRILOSEC Take 40 mg by mouth daily before breakfast.   risperiDONE 0.5 MG disintegrating tablet Commonly known as:  RISPERDAL M-TABS Take 1 tablet (0.5 mg total) by mouth 2 (two) times daily. Discontinue once May rshe is more clear in her mental state then  May restart her depression medications.   TURMERIC PO Take 1 capsule by mouth at bedtime.   venlafaxine XR 150 MG 24 hr capsule Commonly known as:  EFFEXOR-XR Take 150 mg by mouth every evening.   vitamin B-12 1000 MCG tablet Commonly known as:  CYANOCOBALAMIN Take 1,000 mcg by mouth daily.   zolpidem 5 MG tablet Commonly known as:  AMBIEN Take 5 mg by mouth at bedtime.            Durable Medical Equipment  (From admission, onward)         Start     Ordered   11/22/18 1114  For home use only DME Gilford Rile  [facility])  Once    Question:  Patient needs a walker to treat with the following condition  Answer:  Weakness   11/22/18 1114            Today   CHIEF COMPLAINT:   No acute issues overnight.  Patient refused to take her medications this morning.   VITAL SIGNS:  Blood pressure (!) 159/85, pulse (!) 108, temperature 98.5 F (36.9 C), temperature source  Oral, resp. rate 16, height 5\' 6"  (1.676 m), weight 69.8 kg, SpO2 97 %.   REVIEW OF SYSTEMS:  Review of Systems  Constitutional: Negative.  Negative for chills, fever and malaise/fatigue.  HENT: Negative.  Negative for ear discharge, ear pain, hearing loss, nosebleeds and sore throat.   Eyes: Negative.  Negative for blurred vision and pain.  Respiratory: Negative.  Negative for cough, hemoptysis, shortness of breath and wheezing.   Cardiovascular: Negative.  Negative for chest pain, palpitations and leg swelling.  Gastrointestinal: Negative.  Negative for abdominal pain, blood in stool, diarrhea, nausea and vomiting.  Genitourinary: Negative.  Negative for dysuria.  Musculoskeletal: Negative.  Negative for back pain.  Skin: Negative.  Neurological: Negative for dizziness, tremors, speech change, focal weakness, seizures and headaches.  Endo/Heme/Allergies: Negative.  Does not bruise/bleed easily.  Psychiatric/Behavioral: Negative.  Negative for depression, hallucinations and suicidal ideas.     PHYSICAL EXAMINATION:  GENERAL:  63 y.o.-year-old patient lying in the bed with no acute distress.  NECK:  Supple, no jugular venous distention. No thyroid enlargement, no tenderness.  LUNGS: Normal breath sounds bilaterally, no wheezing, rales,rhonchi  No use of accessory muscles of respiration.  CARDIOVASCULAR: S1, S2 normal. No murmurs, rubs, or gallops.  ABDOMEN: Soft, non-tender, non-distended. Bowel sounds present. No organomegaly or mass.  EXTREMITIES: No pedal edema, cyanosis, or clubbing.  PSYCHIATRIC: The patient is alert and oriented x 3. Talks when she wants to  SKIN: No obvious rash, lesion, or ulcer.   DATA REVIEW:   CBC Recent Labs  Lab 11/21/18 0455  WBC 9.8  HGB 11.8*  HCT 34.7*  PLT 620*    Chemistries  Recent Labs  Lab 11/21/18 0455 11/22/18 0815  NA 134* 131*  K 3.3* 3.1*  CL 102 101  CO2 21* 19*  GLUCOSE 109* 106*  BUN 12 10  CREATININE 0.42* <0.30*   CALCIUM 8.8* 8.6*  MG 1.9  --     Cardiac Enzymes No results for input(s): TROPONINI in the last 168 hours.  Microbiology Results  @MICRORSLT48 @  RADIOLOGY:  No results found.    Allergies as of 11/22/2018      Reactions   Erythromycin Base Swelling   Erythromycin ophthalmic ointment      Medication List    STOP taking these medications   etodolac 400 MG tablet Commonly known as:  LODINE     TAKE these medications   acetaminophen 500 MG tablet Commonly known as:  TYLENOL Take 2 tablets (1,000 mg total) by mouth every 8 (eight) hours.   aspirin EC 81 MG tablet Take 81 mg by mouth daily.   betamethasone dipropionate 0.05 % ointment Commonly known as:  DIPROLENE Apply 1 application topically 2 (two) times daily as needed. For psoriasis   buPROPion 75 MG tablet Commonly known as:  WELLBUTRIN Take 1 tablet (75 mg total) by mouth 2 (two) times daily. May restart once she is more clear in her mental state. What changed:  additional instructions   CALCIUM 600+D3 PO Take 1 tablet by mouth 2 (two) times daily.   cholecalciferol 25 MCG (1000 UT) tablet Commonly known as:  VITAMIN D Take 1,000 Units by mouth at bedtime.   Fish Oil 1200 MG Caps Take 1,200 mg by mouth 2 (two) times daily.   LORazepam 0.5 MG tablet Commonly known as:  ATIVAN Take 0.5 mg by mouth every 8 (eight) hours as needed for anxiety.   metoprolol succinate 25 MG 24 hr tablet Commonly known as:  TOPROL-XL Take 1 tablet (25 mg total) by mouth daily. Start taking on:  November 23, 2018   multivitamin with minerals Tabs tablet Take 1 tablet by mouth daily. Centrum Silver   nicotine 14 mg/24hr patch Commonly known as:  NICODERM CQ - dosed in mg/24 hours Place 1 patch (14 mg total) onto the skin daily. Start taking on:  November 23, 2018   omeprazole 40 MG capsule Commonly known as:  PRILOSEC Take 40 mg by mouth daily before breakfast.   risperiDONE 0.5 MG disintegrating tablet Commonly known  as:  RISPERDAL M-TABS Take 1 tablet (0.5 mg total) by mouth 2 (two) times daily. Discontinue once May rshe is more clear in her mental  state then  May restart her depression medications.   TURMERIC PO Take 1 capsule by mouth at bedtime.   venlafaxine XR 150 MG 24 hr capsule Commonly known as:  EFFEXOR-XR Take 150 mg by mouth every evening.   vitamin B-12 1000 MCG tablet Commonly known as:  CYANOCOBALAMIN Take 1,000 mcg by mouth daily.   zolpidem 5 MG tablet Commonly known as:  AMBIEN Take 5 mg by mouth at bedtime.            Durable Medical Equipment  (From admission, onward)         Start     Ordered   11/22/18 1114  For home use only DME Gilford Rile  [facility])  Once    Question:  Patient needs a walker to treat with the following condition  Answer:  Weakness   11/22/18 1114             Management plans discussed with the patient's husband is in agreement. Stable for discharge   Patient should follow up with pcp  CODE STATUS:     Code Status Orders  (From admission, onward)         Start     Ordered   11/01/18 1454  Full code  Continuous     11/01/18 1453        Code Status History    This patient has a current code status but no historical code status.      TOTAL TIME TAKING CARE OF THIS PATIENT: 39 minutes.    Note: This dictation was prepared with Dragon dictation along with smaller phrase technology. Any transcriptional errors that result from this process are unintentional.  Bettey Costa M.D on 11/22/2018 at 11:14 AM  Between 7am to 6pm - Pager - [phone] After 6pm go to [email or web address] - password [email or web address] [facility]  Office  [phone]  CC: Primary care physician; Katheren Shams

## 2018-11-22 NOTE — TOC Transition Note (Addendum)
Transition of Care [facility]) - CM/SW Discharge Note   Patient Details  Name: Cindy Morrison MRN: [id number] Date of Birth: 1955-12-27  [facility]) CM/SW Contact:  Latanya Maudlin, RN Phone Number: 11/22/2018, 2:41 PM   Clinical Narrative:  Patient to be discharged per MD order. Orders in place for home health services. Patient was being worked up for skilled nursing but they now prefer home health. Patient and spouse feel it is the better option. CMS Medicare.gov Compare Post Acute Care list reviewed with patient and they prefer [facility]. Checked with several agencies regarding referral and due to [employer] every local home health agency is out of network with the patient and it would result in home health being charged at the full rate per session. I have talked with teresa from [facility] but she says it could take a while for the referral to be processed. If [facility] can accept [facility] will be home health agency. I have spoke with several coordinators with [facility] through [place] about home health. I was able to provide a referral to them and they will be able to coordinate home health with the agency of their choosing and start of care will likely be 4/21. Patients intake # is [id number]. Faxed over all needed info [facility]. If [facility] notifies me late in the day of their acceptance I will cancel the referral to [facility]. Spoke to the spouse Louie Casa of this plan several times and he is agreeable. I have given him the [facility] number and intake number. Patient has rolling walker and a wheelchair in the home, no further DME needs.  Update 16:09- Helene [facility] from [place] thinks she will be able to accept referral. Voicemail left with spouse for update. [facility] not currently open.    Final next level of care: Home w Home Health Services Barriers to Discharge: No Barriers Identified   Patient Goals and CMS Choice Patient states their goals for this hospitalization and ongoing  recovery are:: Husband is hopeful that patient will improve enough to return back home with home health. CMS Medicare.gov Compare Post Acute Care list provided to:: Patient Represenative (must comment)(spouse) Choice offered to / list presented to : Spouse  Discharge Placement                       Discharge Plan and Services   Discharge Planning Services: CM Consult Post Acute Care Choice: Home Health              HH Arranged: RN, PT, Nurse's Aide [facility] Agency: Other - See comment(coordinated through [facility])   Social Determinants of Health (SDOH) Interventions     Readmission Risk Interventions No flowsheet data found.

## 2019-05-16 IMAGING — CT CT ANGIO CHEST-ABD-PELV FOR DISSECTION W/ AND WO/W CM
2 of 7 series · 11 of 36 positions shown, 15 images · IV contrast (APPLIED)
Comparison: CT scan of September 01, 2017.

CLINICAL DATA: Back pain.

EXAM:
CT ANGIOGRAPHY CHEST, ABDOMEN AND PELVIS
TECHNIQUE: Multidetector CT imaging through the chest, abdomen and pelvis was
performed using the standard protocol during bolus administration of
intravenous contrast. Multiplanar reconstructed images and MIPs were
obtained and reviewed to evaluate the vascular anatomy.
CONTRAST:  100mL AYPHJ4-EZW IOPAMIDOL (AYPHJ4-EZW) INJECTION 76%

[Series 6: axial arterial · axial · arterial · 0.69mm/px · z∈[-614,-77]mm · 10 of 209 slices shown, 13 images]
[im 15/209  mediastinal]
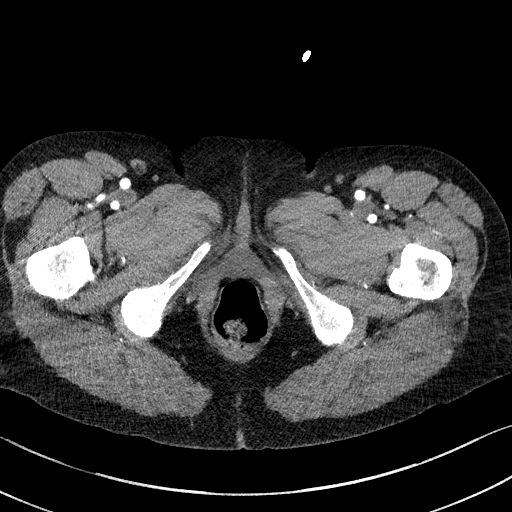
[im 15/209  bone]
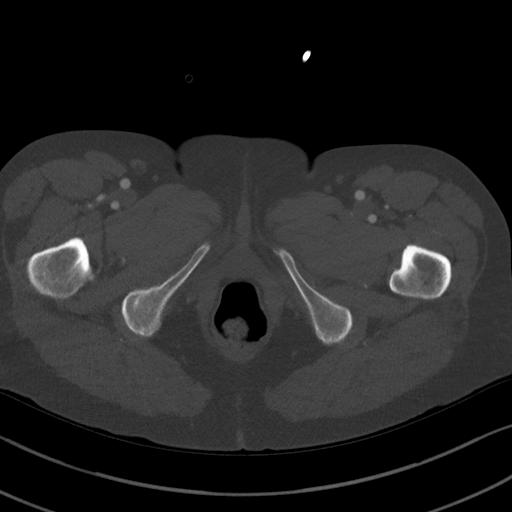
[im 45/209  mediastinal]
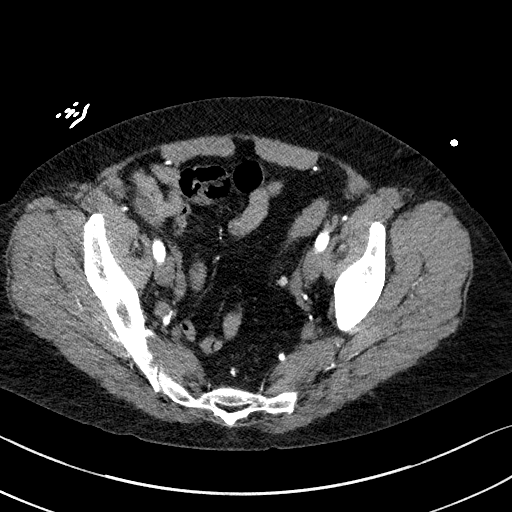
[im 75/209  mediastinal]
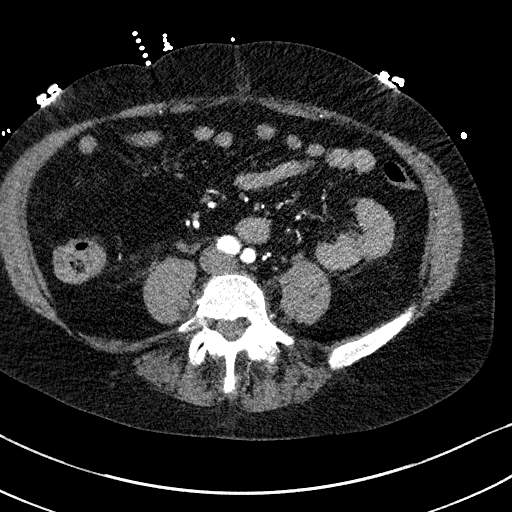
[im 90/209  mediastinal]
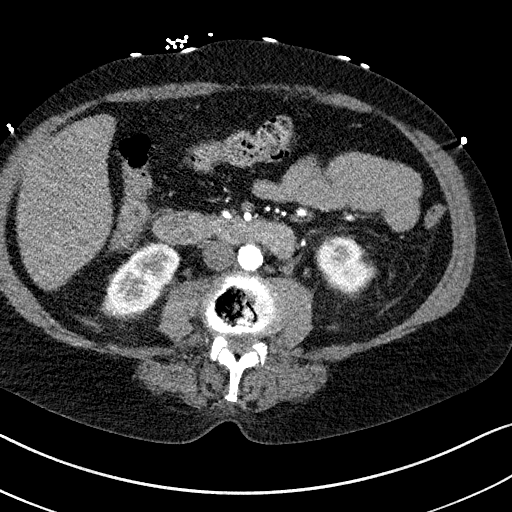
[im 119/209  mediastinal]
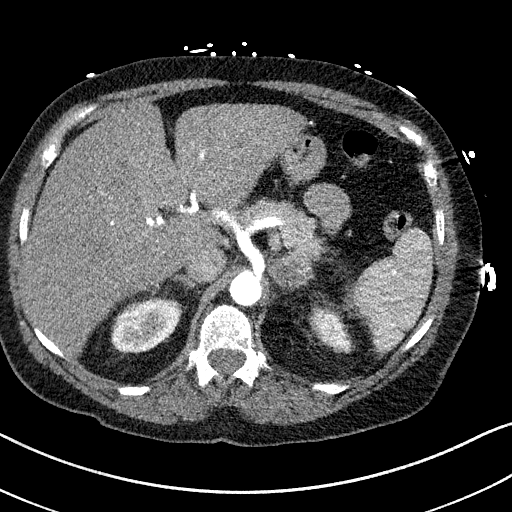
[im 134/209  mediastinal]
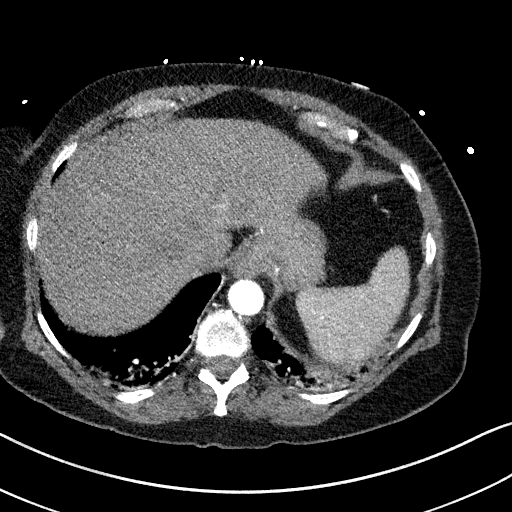
[im 149/209  lung]
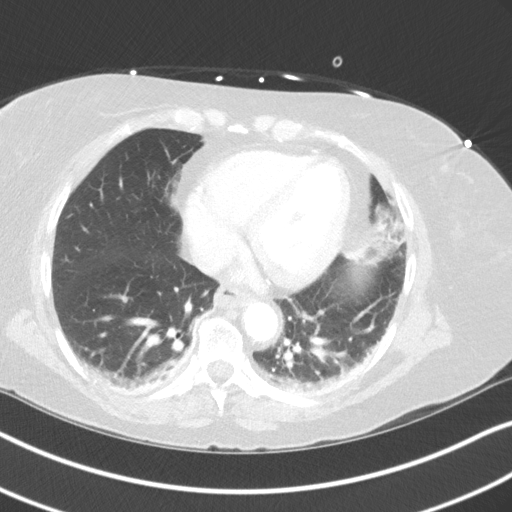
[im 164/209  mediastinal]
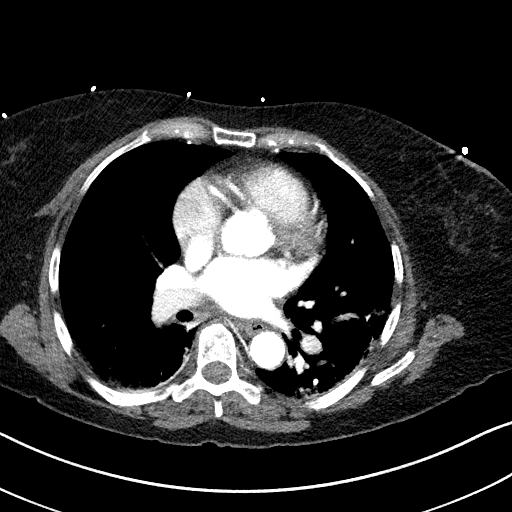
[im 164/209  lung]
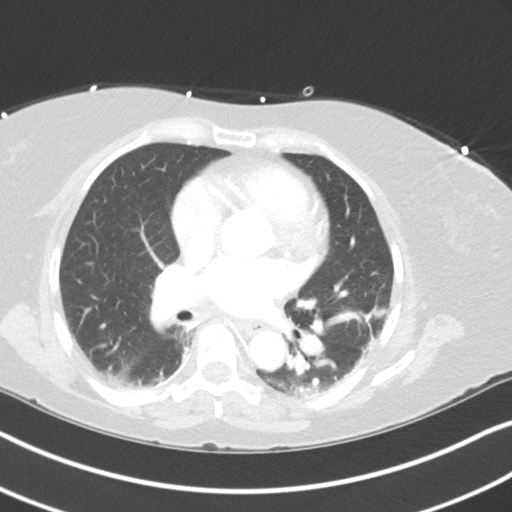
[im 179/209  lung]
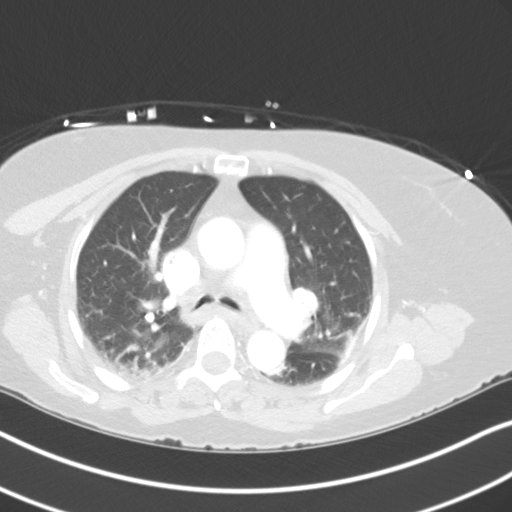
[im 194/209  mediastinal]
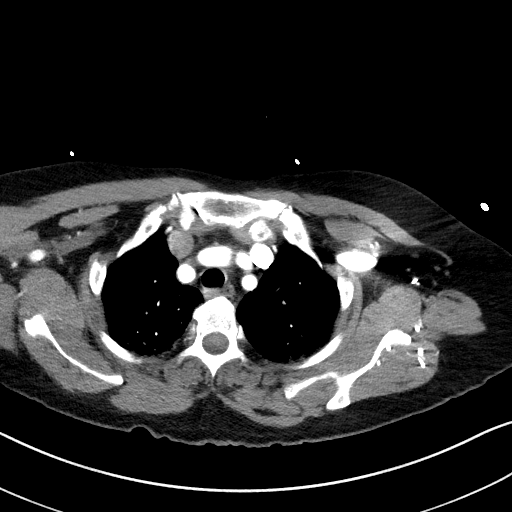
[im 194/209  lung]
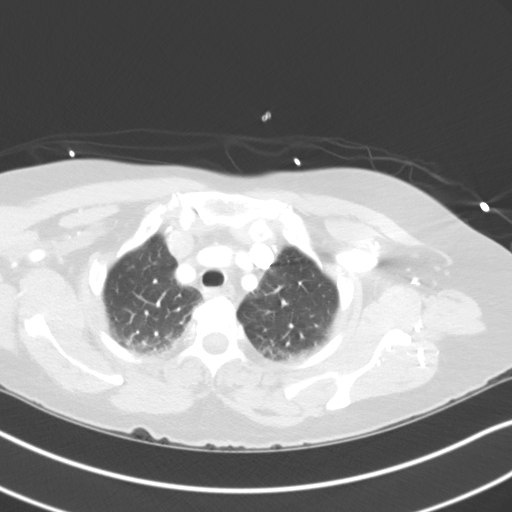

[Series 8: coronals · coronal · 0.76mm/px · 1 of 137 slices shown, 2 images]
[im 69/137  mediastinal]
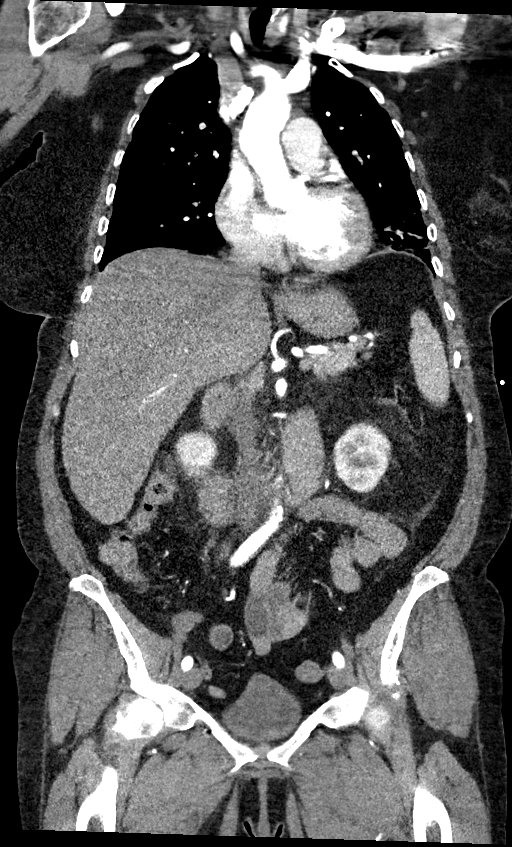
[im 69/137  bone]
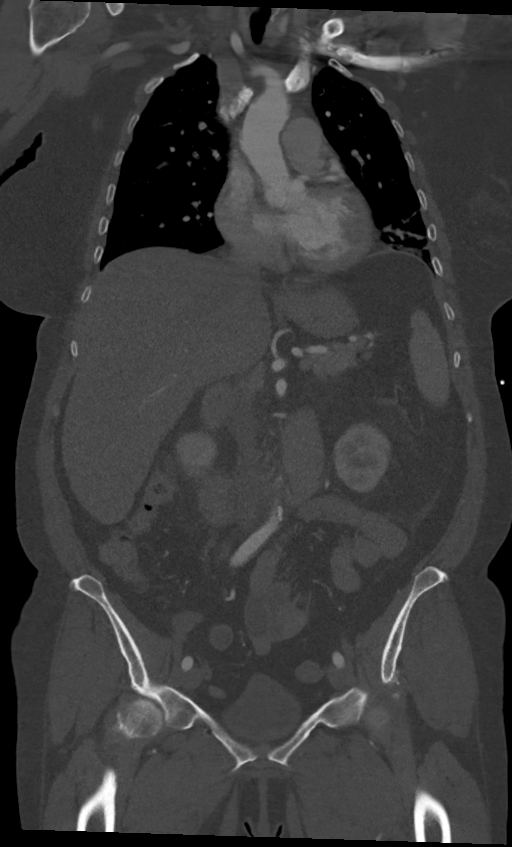

[11 of 36 positions shown; findings below may reference images not displayed]

FINDINGS: CTA CHEST FINDINGS

Cardiovascular: Preferential opacification of the thoracic aorta. No
evidence of thoracic aortic aneurysm or dissection. Normal heart
size. No pericardial effusion.

Mediastinum/Nodes: Small sliding-type hiatal hernia is noted.
Thyroid gland is unremarkable. No significant adenopathy is noted.

Lungs/Pleura: No pneumothorax or pleural effusion is noted. Mild
bilateral posterior basilar subsegmental atelectasis is noted. Mild
left lingular subsegmental atelectasis is noted.

Musculoskeletal: No chest wall abnormality. No acute or significant
osseous findings.

Review of the MIP images confirms the above findings.

CTA ABDOMEN AND PELVIS FINDINGS

VASCULAR

Aorta: Atherosclerosis of abdominal aorta is noted without aneurysm
or dissection.

Celiac: Patent without evidence of aneurysm, dissection, vasculitis
or significant stenosis.

SMA: Patent without evidence of aneurysm, dissection, vasculitis or
significant stenosis.

Renals: Both renal arteries are patent without evidence of aneurysm,
dissection, vasculitis, fibromuscular dysplasia or significant
stenosis.

IMA: Patent without evidence of aneurysm, dissection, vasculitis or
significant stenosis.

Inflow: Patent without evidence of aneurysm, dissection, vasculitis
or significant stenosis.

Veins: No obvious venous abnormality within the limitations of this
arterial phase study.

Review of the MIP images confirms the above findings.

NON-VASCULAR

Hepatobiliary: No focal liver abnormality is seen. No gallstones,
gallbladder wall thickening, or biliary dilatation.

Pancreas: Unremarkable. No pancreatic ductal dilatation or
surrounding inflammatory changes.

Spleen: Normal in size without focal abnormality.

Adrenals/Urinary Tract: Stable left adrenal adenoma. Right adrenal
gland appears normal. Stable exophytic right renal cyst is noted. No
hydronephrosis or renal obstruction is noted. Urinary bladder is
unremarkable.

Stomach/Bowel: Stomach is within normal limits. Appendix appears
normal. No evidence of bowel wall thickening, distention, or
inflammatory changes.

Lymphatic: No significant adenopathy is noted.

Reproductive: Status post hysterectomy. No adnexal masses.

Other: No abdominal wall hernia or abnormality. No abdominopelvic
ascites.

Musculoskeletal: No acute or significant osseous findings.

Review of the MIP images confirms the above findings.
IMPRESSION: No definite evidence of thoracic or abdominal aortic dissection or
aneurysm.

No evidence of mesenteric or renal artery stenosis.

Mild bibasilar subsegmental atelectasis is noted.

Small sliding-type hiatal hernia.

Stable left adrenal adenoma.

Aortic Atherosclerosis ([id number]).

## 2019-05-17 IMAGING — DX PORTABLE CHEST - 1 VIEW
1 series · 1 of 1 positions shown · non-contrast
Comparison: 11/01/2018

CLINICAL DATA: Shortness of breath with hypotension and hypoxia.

EXAM:
PORTABLE CHEST 1 VIEW

[chest ap]
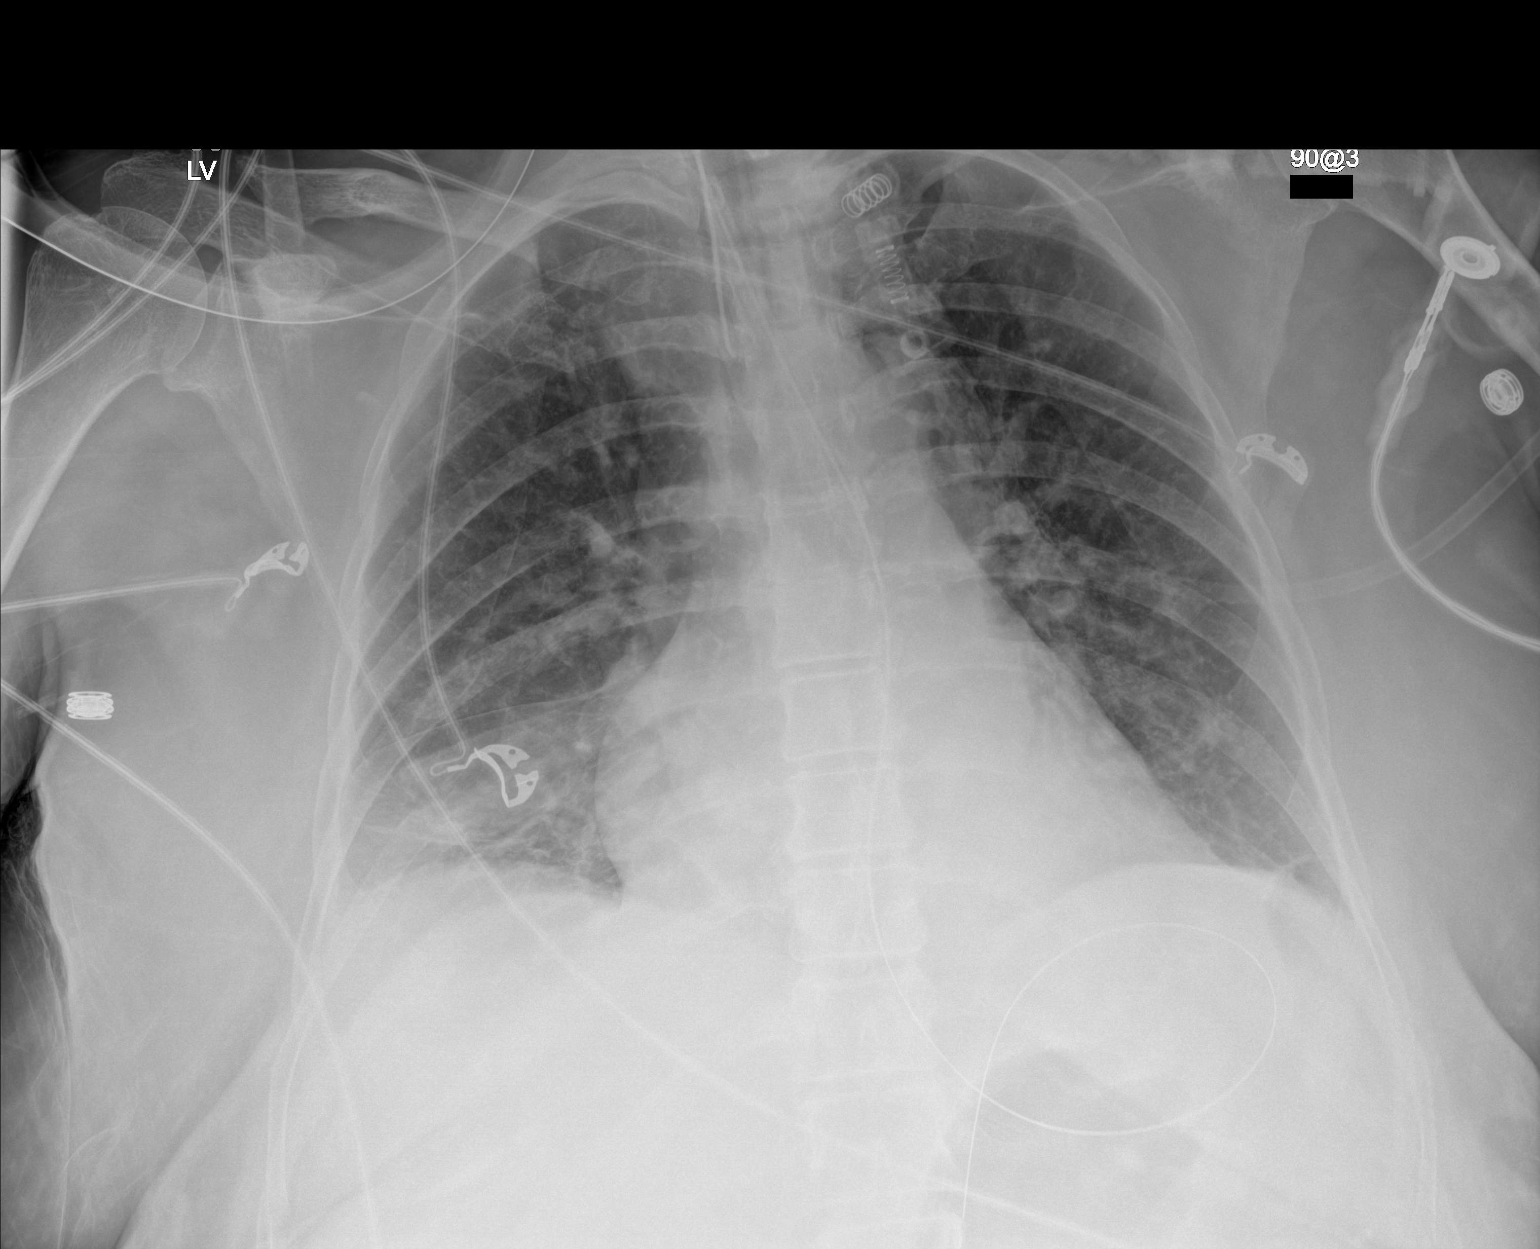

[1 of 1 positions shown; findings below may reference images not displayed]

FINDINGS: Endotracheal tube and enteric tube unchanged. Lungs are adequately
inflated and demonstrate hazy opacification over the right base
slightly worse. Overall improvement in previously noted patchy
bilateral density. No definite effusion. Remainder of the exam is
unchanged.
IMPRESSION: Interval improvement to near resolution in previously seen bilateral
patchy density. Worsened mild hazy opacification over the right base
which may be due to atelectasis or infection.

Tubes and lines unchanged.

## 2019-05-19 IMAGING — DX PORTABLE ABDOMEN - 1 VIEW
2 series · 2 of 2 positions shown · non-contrast
Comparison: Abdominal radiograph 11/01/2018

CLINICAL DATA: 62-year-old female currently intubated with
abdominal distension

EXAM:
PORTABLE ABDOMEN - 1 VIEW

[abdomen supine (1 of 2)]
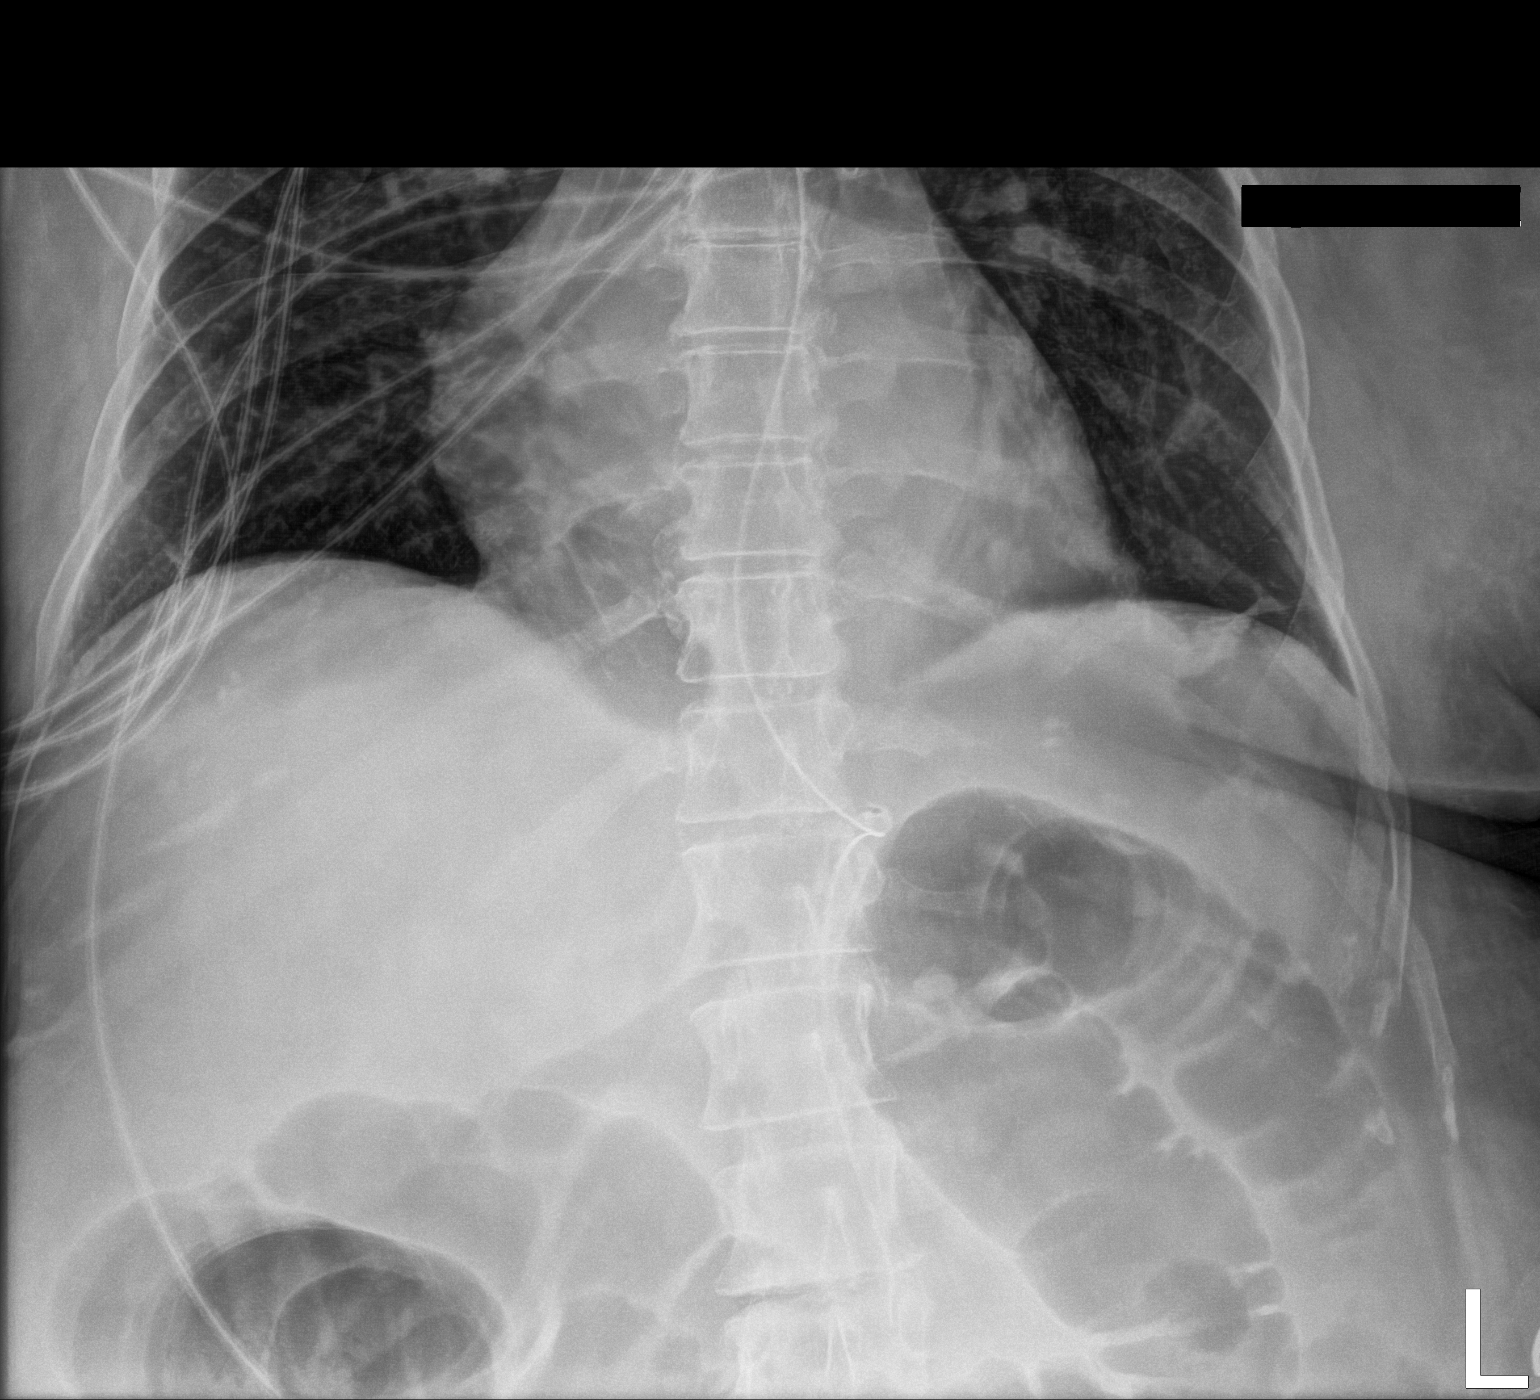

[abdomen supine (2 of 2)]
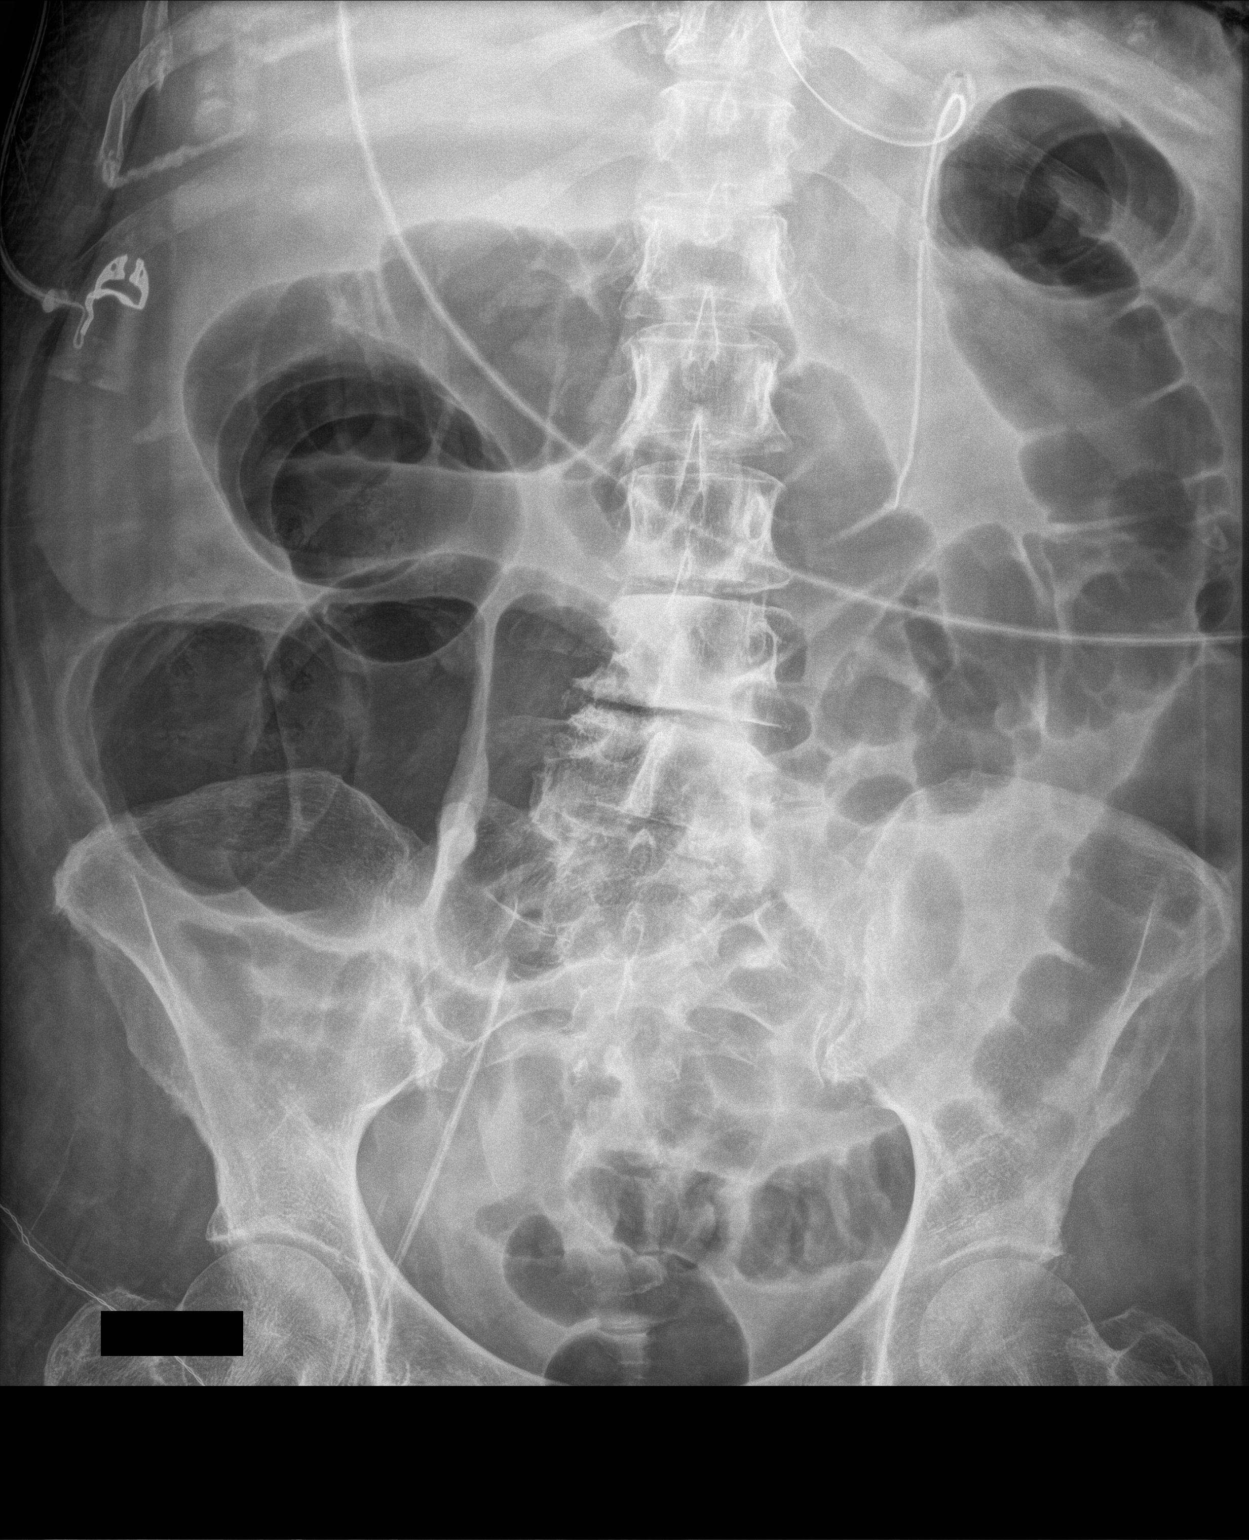

[2 of 2 positions shown; findings below may reference images not displayed]

FINDINGS: A nasogastric tube is present. The tip of the tube overlies the
gastric body. The visualized lung bases are clear. No evidence of
large free air on this supine series. The colon is diffusely
distended with gas but not dilated. No significant gaseous
distension of small bowel. A right femoral central venous catheter
is present. The tip overlies the right common iliac vein. A second
catheter is present more laterally, likely representing a arterial
catheter within the superficial femoral artery. Lower lumbar
degenerative disc disease. No acute osseous abnormality.
IMPRESSION: 1. Marked gaseous distension of the colon without dilation. Findings
are most suggestive of colonic ileus.
2. No evidence of small bowel obstruction.
3. The tip of the gastric tube overlies the gastric body.
4. Right femoral arterial and venous lines.

## 2019-09-19 ENCOUNTER — Ambulatory Visit: Payer: Medicare Other | Attending: Internal Medicine

## 2019-09-19 DIAGNOSIS — Z23 Encounter for immunization: Secondary | ICD-10-CM | POA: Insufficient documentation

## 2019-09-19 NOTE — Progress Notes (Signed)
   Covid-19 Vaccination Clinic  Name:  Cindy Morrison    MRN: [id number] DOB: 11-20-1955  09/19/2019  Cindy Morrison was observed post Covid-19 immunization for 15 minutes without incidence. She was provided with Vaccine Information Sheet and instruction to access the V-Safe system.   Cindy Morrison was instructed to call 911 with any severe reactions post vaccine: Marland Kitchen Difficulty breathing  . Swelling of your face and throat  . A fast heartbeat  . A bad rash all over your body  . Dizziness and weakness    Immunizations Administered    Name Date Dose VIS Date Route   Pfizer COVID-19 Vaccine 09/19/2019 10:19 AM 0.3 mL 07/16/2019 Intramuscular   Manufacturer: [place]   Lot: [id number]   [place]: [id number]

## 2019-09-22 ENCOUNTER — Other Ambulatory Visit: Payer: Self-pay | Admitting: Orthopedic Surgery

## 2019-09-22 DIAGNOSIS — M2391 Unspecified internal derangement of right knee: Secondary | ICD-10-CM

## 2019-09-22 DIAGNOSIS — G8929 Other chronic pain: Secondary | ICD-10-CM

## 2019-09-22 DIAGNOSIS — M1711 Unilateral primary osteoarthritis, right knee: Secondary | ICD-10-CM

## 2019-09-22 DIAGNOSIS — M25361 Other instability, right knee: Secondary | ICD-10-CM

## 2019-10-04 ENCOUNTER — Ambulatory Visit
Admission: RE | Admit: 2019-10-04 | Discharge: 2019-10-04 | Disposition: A | Discharge status: Home / Self Care | Payer: Medicare Other | Source: Ambulatory Visit | Attending: Orthopedic Surgery | Admitting: Orthopedic Surgery

## 2019-10-04 ENCOUNTER — Other Ambulatory Visit: Payer: Self-pay

## 2019-10-04 DIAGNOSIS — G8929 Other chronic pain: Secondary | ICD-10-CM | POA: Diagnosis present

## 2019-10-04 DIAGNOSIS — M25361 Other instability, right knee: Secondary | ICD-10-CM | POA: Insufficient documentation

## 2019-10-04 DIAGNOSIS — M25561 Pain in right knee: Secondary | ICD-10-CM | POA: Insufficient documentation

## 2019-10-04 DIAGNOSIS — M1711 Unilateral primary osteoarthritis, right knee: Secondary | ICD-10-CM

## 2019-10-04 DIAGNOSIS — M2391 Unspecified internal derangement of right knee: Secondary | ICD-10-CM | POA: Diagnosis present

## 2019-10-20 ENCOUNTER — Ambulatory Visit: Payer: Medicare Other | Attending: Internal Medicine

## 2019-10-20 DIAGNOSIS — Z23 Encounter for immunization: Secondary | ICD-10-CM

## 2019-10-20 NOTE — Progress Notes (Signed)
   Covid-19 Vaccination Clinic  Name:  Cindy Morrison    MRN: [id number] DOB: 09-02-1955  10/20/2019  Cindy Morrison was observed post Covid-19 immunization for 15 minutes without incident. She was provided with Vaccine Information Sheet and instruction to access the V-Safe system.   Cindy Morrison was instructed to call 911 with any severe reactions post vaccine: Marland Kitchen Difficulty breathing  . Swelling of face and throat  . A fast heartbeat  . A bad rash all over body  . Dizziness and weakness   Immunizations Administered    Name Date Dose VIS Date Route   Pfizer COVID-19 Vaccine 10/20/2019 10:55 AM 0.3 mL 07/16/2019 Intramuscular   Manufacturer: [place]   Lot: [id number]   [place]: [id number]

## 2020-03-01 ENCOUNTER — Other Ambulatory Visit: Payer: Self-pay | Admitting: Orthopedic Surgery

## 2020-03-01 DIAGNOSIS — M25511 Pain in right shoulder: Secondary | ICD-10-CM

## 2020-03-23 ENCOUNTER — Ambulatory Visit
Admission: RE | Admit: 2020-03-23 | Discharge: 2020-03-23 | Disposition: A | Discharge status: Home / Self Care | Payer: Medicare Other | Source: Ambulatory Visit | Attending: Orthopedic Surgery | Admitting: Orthopedic Surgery

## 2020-03-23 ENCOUNTER — Other Ambulatory Visit: Payer: Self-pay

## 2020-03-23 DIAGNOSIS — M25511 Pain in right shoulder: Secondary | ICD-10-CM | POA: Insufficient documentation

## 2020-04-13 ENCOUNTER — Other Ambulatory Visit: Payer: Self-pay | Admitting: Orthopedic Surgery

## 2020-04-24 ENCOUNTER — Encounter: Payer: Self-pay | Admitting: Orthopedic Surgery

## 2020-04-24 ENCOUNTER — Other Ambulatory Visit: Payer: Self-pay

## 2020-04-26 ENCOUNTER — Other Ambulatory Visit
Admission: RE | Admit: 2020-04-26 | Discharge: 2020-04-26 | Disposition: A | Discharge status: Home / Self Care | Payer: Medicare Other | Source: Ambulatory Visit | Attending: Orthopedic Surgery | Admitting: Orthopedic Surgery

## 2020-04-26 ENCOUNTER — Other Ambulatory Visit: Payer: Self-pay

## 2020-04-26 DIAGNOSIS — Z01812 Encounter for preprocedural laboratory examination: Secondary | ICD-10-CM | POA: Diagnosis present

## 2020-04-26 DIAGNOSIS — Z20822 Contact with and (suspected) exposure to covid-19: Secondary | ICD-10-CM | POA: Insufficient documentation

## 2020-04-27 LAB — SARS CORONAVIRUS 2 (TAT 6-24 HRS): SARS Coronavirus 2: NEGATIVE

## 2020-04-28 ENCOUNTER — Ambulatory Visit
Admission: RE | Admit: 2020-04-28 | Discharge: 2020-04-28 | Disposition: A | Discharge status: Home / Self Care | Payer: Medicare Other | Attending: Orthopedic Surgery | Admitting: Orthopedic Surgery

## 2020-04-28 ENCOUNTER — Other Ambulatory Visit: Payer: Self-pay

## 2020-04-28 ENCOUNTER — Encounter
Admission: RE | Disposition: A | Discharge status: Home / Self Care | Payer: Self-pay | Source: Home / Self Care | Attending: Orthopedic Surgery

## 2020-04-28 ENCOUNTER — Ambulatory Visit: Payer: Medicare Other | Admitting: Anesthesiology

## 2020-04-28 ENCOUNTER — Encounter: Payer: Self-pay | Admitting: Orthopedic Surgery

## 2020-04-28 DIAGNOSIS — M549 Dorsalgia, unspecified: Secondary | ICD-10-CM | POA: Insufficient documentation

## 2020-04-28 DIAGNOSIS — Z820 Family history of epilepsy and other diseases of the nervous system: Secondary | ICD-10-CM | POA: Diagnosis not present

## 2020-04-28 DIAGNOSIS — Z85820 Personal history of malignant melanoma of skin: Secondary | ICD-10-CM | POA: Diagnosis not present

## 2020-04-28 DIAGNOSIS — Z818 Family history of other mental and behavioral disorders: Secondary | ICD-10-CM | POA: Insufficient documentation

## 2020-04-28 DIAGNOSIS — Z8262 Family history of osteoporosis: Secondary | ICD-10-CM | POA: Diagnosis not present

## 2020-04-28 DIAGNOSIS — L409 Psoriasis, unspecified: Secondary | ICD-10-CM | POA: Insufficient documentation

## 2020-04-28 DIAGNOSIS — Z90710 Acquired absence of both cervix and uterus: Secondary | ICD-10-CM | POA: Insufficient documentation

## 2020-04-28 DIAGNOSIS — Z803 Family history of malignant neoplasm of breast: Secondary | ICD-10-CM | POA: Insufficient documentation

## 2020-04-28 DIAGNOSIS — Z801 Family history of malignant neoplasm of trachea, bronchus and lung: Secondary | ICD-10-CM | POA: Insufficient documentation

## 2020-04-28 DIAGNOSIS — Z8261 Family history of arthritis: Secondary | ICD-10-CM | POA: Insufficient documentation

## 2020-04-28 DIAGNOSIS — Z809 Family history of malignant neoplasm, unspecified: Secondary | ICD-10-CM | POA: Diagnosis not present

## 2020-04-28 DIAGNOSIS — F329 Major depressive disorder, single episode, unspecified: Secondary | ICD-10-CM | POA: Insufficient documentation

## 2020-04-28 DIAGNOSIS — F419 Anxiety disorder, unspecified: Secondary | ICD-10-CM | POA: Diagnosis not present

## 2020-04-28 DIAGNOSIS — M75101 Unspecified rotator cuff tear or rupture of right shoulder, not specified as traumatic: Secondary | ICD-10-CM | POA: Diagnosis present

## 2020-04-28 DIAGNOSIS — G8929 Other chronic pain: Secondary | ICD-10-CM | POA: Insufficient documentation

## 2020-04-28 DIAGNOSIS — K219 Gastro-esophageal reflux disease without esophagitis: Secondary | ICD-10-CM | POA: Insufficient documentation

## 2020-04-28 DIAGNOSIS — Z9889 Other specified postprocedural states: Secondary | ICD-10-CM | POA: Insufficient documentation

## 2020-04-28 DIAGNOSIS — Z825 Family history of asthma and other chronic lower respiratory diseases: Secondary | ICD-10-CM | POA: Insufficient documentation

## 2020-04-28 DIAGNOSIS — Z87891 Personal history of nicotine dependence: Secondary | ICD-10-CM | POA: Insufficient documentation

## 2020-04-28 DIAGNOSIS — Z8249 Family history of ischemic heart disease and other diseases of the circulatory system: Secondary | ICD-10-CM | POA: Diagnosis not present

## 2020-04-28 DIAGNOSIS — M75121 Complete rotator cuff tear or rupture of right shoulder, not specified as traumatic: Secondary | ICD-10-CM | POA: Diagnosis not present

## 2020-04-28 HISTORY — PX: SHOULDER ARTHROSCOPY WITH OPEN ROTATOR CUFF REPAIR: SHX6092

## 2020-04-28 HISTORY — DX: Presence of spectacles and contact lenses: Z97.3

## 2020-04-28 HISTORY — DX: Malignant neoplasm of left choroid: C69.32

## 2020-04-28 SURGERY — ARTHROSCOPY, SHOULDER WITH REPAIR, ROTATOR CUFF, OPEN
Anesthesia: General | Site: Shoulder | Laterality: Right

## 2020-04-28 MED ORDER — ASPIRIN EC 325 MG PO TBEC: 325.0000 mg | DELAYED_RELEASE_TABLET | Freq: Every day | ORAL | 0 refills | Status: AC

## 2020-04-28 MED ORDER — LACTATED RINGERS IV SOLN: INTRAVENOUS | Status: DC

## 2020-04-28 MED ORDER — PHENYLEPHRINE HCL (PRESSORS) 10 MG/ML IV SOLN
INTRAVENOUS | Status: DC | PRN
  Administered 2020-04-28: 100 ug via INTRAVENOUS
  Administered 2020-04-28 (×11): 50 ug via INTRAVENOUS

## 2020-04-28 MED ORDER — FENTANYL CITRATE (PF) 100 MCG/2ML IJ SOLN
INTRAMUSCULAR | Status: DC | PRN
Start: 2020-04-28 — End: 2020-04-28
  Administered 2020-04-28: 100 ug via INTRAVENOUS

## 2020-04-28 MED ORDER — PROPOFOL 10 MG/ML IV BOLUS
INTRAVENOUS | Status: DC | PRN
  Administered 2020-04-28: 180 mg via INTRAVENOUS

## 2020-04-28 MED ORDER — LACTATED RINGERS IV SOLN
INTRAVENOUS | Status: DC | PRN
  Administered 2020-04-28: 12000 mL

## 2020-04-28 MED ORDER — LIDOCAINE HCL (CARDIAC) PF 100 MG/5ML IV SOSY
PREFILLED_SYRINGE | INTRAVENOUS | Status: DC | PRN
  Administered 2020-04-28: 20 mg via INTRATRACHEAL

## 2020-04-28 MED ORDER — DEXAMETHASONE SODIUM PHOSPHATE 4 MG/ML IJ SOLN
INTRAMUSCULAR | Status: DC | PRN
  Administered 2020-04-28: 4 mg via INTRAVENOUS

## 2020-04-28 MED ORDER — BUPIVACAINE LIPOSOME 1.3 % IJ SUSP
INTRAMUSCULAR | Status: DC | PRN
  Administered 2020-04-28: 20 mL

## 2020-04-28 MED ORDER — BUPIVACAINE HCL (PF) 0.5 % IJ SOLN
INTRAMUSCULAR | Status: DC | PRN
  Administered 2020-04-28: 20 mL

## 2020-04-28 MED ORDER — EPHEDRINE SULFATE 50 MG/ML IJ SOLN
INTRAMUSCULAR | Status: DC | PRN
  Administered 2020-04-28: 10 mg via INTRAVENOUS
  Administered 2020-04-28: 5 mg via INTRAVENOUS
  Administered 2020-04-28: 10 mg via INTRAVENOUS
  Administered 2020-04-28 (×3): 5 mg via INTRAVENOUS

## 2020-04-28 MED ORDER — ONDANSETRON 4 MG PO TBDP: 4.0000 mg | ORAL_TABLET | Freq: Three times a day (TID) | ORAL | 0 refills | Status: DC | PRN

## 2020-04-28 MED ORDER — ONDANSETRON HCL 4 MG/2ML IJ SOLN
INTRAMUSCULAR | Status: DC | PRN
  Administered 2020-04-28: 4 mg via INTRAVENOUS

## 2020-04-28 MED ORDER — MIDAZOLAM HCL 5 MG/5ML IJ SOLN
INTRAMUSCULAR | Status: DC | PRN
  Administered 2020-04-28: 2 mg via INTRAVENOUS

## 2020-04-28 MED ORDER — CEFAZOLIN SODIUM-DEXTROSE 2-4 GM/100ML-% IV SOLN
2.0000 g | INTRAVENOUS | Status: AC
  Administered 2020-04-28: 2 g via INTRAVENOUS

## 2020-04-28 MED ORDER — ACETAMINOPHEN 500 MG PO TABS: 1000.0000 mg | ORAL_TABLET | Freq: Three times a day (TID) | ORAL | 2 refills | Status: DC

## 2020-04-28 MED ORDER — OXYCODONE HCL 5 MG PO TABS: 5.0000 mg | ORAL_TABLET | ORAL | 0 refills | Status: DC | PRN

## 2020-04-28 SURGICAL SUPPLY — 66 items
ADAPTER IRRIG TUBE 2 SPIKE SOL (ADAPTER) ×4 IMPLANT
ADH SKN CLS APL DERMABOND .7 (GAUZE/BANDAGES/DRESSINGS) ×1
ADPR TBG 2 SPK PMP STRL ASCP (ADAPTER) ×2
ANCH SUT 4.75 1 ARM KNTLS (Anchor) ×1 IMPLANT
ANCH SUT BN ASCP DLV (Anchor) ×1 IMPLANT
ANCH SUT RGNRT REGENETEN (Staple) ×1 IMPLANT
ANCH SUT SWLK 19.1X5.5 CLS (Anchor) ×1 IMPLANT
ANCHOR BONE REGENETEN (Anchor) ×1 IMPLANT
ANCHOR SWIVELOCK BIO COMP (Anchor) ×1 IMPLANT
ANCHOR TENDON REGENETEN (Staple) ×1 IMPLANT
APL PRP STRL LF DISP 70% ISPRP (MISCELLANEOUS) ×1
BUR BR 5.5 12 FLUTE (BURR) ×2 IMPLANT
BUR RADIUS 4.0X18.5 (BURR) ×2 IMPLANT
CANNULA PART THRD DISP 5.75X7 (CANNULA) ×2 IMPLANT
CHLORAPREP W/TINT 26 (MISCELLANEOUS) ×2 IMPLANT
COOLER POLAR GLACIER W/PUMP (MISCELLANEOUS) ×2 IMPLANT
COVER LIGHT HANDLE UNIVERSAL (MISCELLANEOUS) ×4 IMPLANT
DERMABOND ADVANCED (GAUZE/BANDAGES/DRESSINGS) ×1
DERMABOND ADVANCED .7 DNX12 (GAUZE/BANDAGES/DRESSINGS) ×1 IMPLANT
DRAPE IMP U-DRAPE 54X76 (DRAPES) ×4 IMPLANT
DRAPE INCISE IOBAN 66X45 STRL (DRAPES) ×2 IMPLANT
DRAPE SHEET LG 3/4 BI-LAMINATE (DRAPES) ×2 IMPLANT
DRAPE U-SHAPE 48X52 POLY STRL (PACKS) ×4 IMPLANT
ELECT REM PT RETURN 9FT ADLT (ELECTROSURGICAL) ×2
ELECTRODE REM PT RTRN 9FT ADLT (ELECTROSURGICAL) ×1 IMPLANT
GAUZE SPONGE 4X4 12PLY STRL (GAUZE/BANDAGES/DRESSINGS) ×2 IMPLANT
GAUZE XEROFORM 1X8 LF (GAUZE/BANDAGES/DRESSINGS) ×2 IMPLANT
GLOVE BIOGEL PI IND STRL 8 (GLOVE) ×1 IMPLANT
GLOVE BIOGEL PI INDICATOR 8 (GLOVE) ×1
GOWN STRL REIN 2XL XLG LVL4 (GOWN DISPOSABLE) ×2 IMPLANT
GOWN STRL REUS W/ TWL LRG LVL3 (GOWN DISPOSABLE) ×1 IMPLANT
GOWN STRL REUS W/TWL LRG LVL3 (GOWN DISPOSABLE) ×2
IMPL REGENETEN MEDIUM (Shoulder) IMPLANT
IMPLANT REGENETEN MEDIUM (Shoulder) ×2 IMPLANT
IV LACTATED RINGER IRRG 3000ML (IV SOLUTION) ×16
IV LR IRRIG 3000ML ARTHROMATIC (IV SOLUTION) ×8 IMPLANT
KIT STABILIZATION SHOULDER (MISCELLANEOUS) ×2 IMPLANT
KIT TURNOVER KIT A (KITS) ×2 IMPLANT
MANIFOLD 4PT FOR NEPTUNE1 (MISCELLANEOUS) ×2 IMPLANT
MASK FACE SPIDER DISP (MASK) ×2 IMPLANT
MAT ABSORB  FLUID 56X50 GRAY (MISCELLANEOUS) ×2
MAT ABSORB FLUID 56X50 GRAY (MISCELLANEOUS) ×2 IMPLANT
NDL SAFETY ECLIPSE 18X1.5 (NEEDLE) ×1 IMPLANT
NDL SCORPION MULTI FIRE (NEEDLE) IMPLANT
NEEDLE HYPO 18GX1.5 SHARP (NEEDLE) ×2
NEEDLE SCORPION MULTI FIRE (NEEDLE) ×2 IMPLANT
PACK ARTHROSCOPY SHOULDER (MISCELLANEOUS) ×2 IMPLANT
PAD WRAPON POLAR SHDR UNIV (MISCELLANEOUS) ×1 IMPLANT
PENCIL SMOKE EVACUATOR (MISCELLANEOUS) ×2 IMPLANT
SET TUBE SUCT SHAVER OUTFL 24K (TUBING) ×2 IMPLANT
SET TUBE TIP INTRA-ARTICULAR (MISCELLANEOUS) ×2 IMPLANT
SUT ETHILON 3-0 (SUTURE) ×2 IMPLANT
SUT MNCRL 4-0 (SUTURE) ×2
SUT MNCRL 4-0 27XMFL (SUTURE) ×1
SUT VIC AB 0 CT1 36 (SUTURE) ×2 IMPLANT
SUT VIC AB 2-0 CT2 27 (SUTURE) ×2 IMPLANT
SUTURE MNCRL 4-0 27XMF (SUTURE) ×1 IMPLANT
SUTURE TAPE 1.3 40 TPR END (SUTURE) IMPLANT
SUTURETAPE 1.3 40 TPR END (SUTURE) ×2
SYR 10ML LL (SYRINGE) ×2 IMPLANT
SYSTEM ANCHOR SUT KNTLESS 4.75 (Anchor) ×1 IMPLANT
TAPE MICROFOAM 4IN (TAPE) ×2 IMPLANT
TUBING ARTHRO INFLOW-ONLY STRL (TUBING) ×2 IMPLANT
TUBING CONNECTING 10 (TUBING) ×2 IMPLANT
WAND WEREWOLF FLOW 90D (MISCELLANEOUS) ×2 IMPLANT
WRAPON POLAR PAD SHDR UNIV (MISCELLANEOUS) ×2

## 2020-04-28 NOTE — Transfer of Care (Signed)
Immediate Anesthesia Transfer of Care Note  Patient: Cindy Morrison  Procedure(s) Performed: Right shoulder arthroscopy with mini open revision rotator cuff repair with Regeneten patch (Right Shoulder)  Patient Location: PACU  Anesthesia Type: General  Level of Consciousness: awake, alert  and patient cooperative  Airway and Oxygen Therapy: Patient Spontanous Breathing and Patient connected to supplemental oxygen  Post-op Assessment: Post-op Vital signs reviewed, Patient's Cardiovascular Status Stable, Respiratory Function Stable, Patent Airway and No signs of Nausea or vomiting  Post-op Vital Signs: Reviewed and stable  Complications: No complications documented.

## 2020-04-28 NOTE — Anesthesia Preprocedure Evaluation (Signed)
Anesthesia Evaluation  Patient identified by MRN, date of birth, ID band Patient awake    Reviewed: Allergy & Precautions, NPO status , Patient's Chart, lab work & pertinent test results, reviewed documented beta blocker date and time   History of Anesthesia Complications Negative for: history of anesthetic complications  Airway Mallampati: II  TM Distance: >3 FB Neck ROM: Full    Dental   Pulmonary former smoker,    breath sounds clear to auscultation       Cardiovascular (-) angina(-) DOE  Rhythm:Regular Rate:Normal     Neuro/Psych PSYCHIATRIC DISORDERS Anxiety Depression    GI/Hepatic GERD  Controlled,  Endo/Other    Renal/GU      Musculoskeletal   Abdominal   Peds  Hematology   Anesthesia Other Findings Melanoma  Reproductive/Obstetrics                             Anesthesia Physical Anesthesia Plan  ASA: II  Anesthesia Plan: General   Post-op Pain Management:  Regional for Post-op pain   Induction: Intravenous  PONV Risk Score and Plan: 3 and Treatment may vary due to age or medical condition, Ondansetron, Dexamethasone and Midazolam  Airway Management Planned: LMA  Additional Equipment:   Intra-op Plan:   Post-operative Plan: Extubation in OR  Informed Consent: I have reviewed the patients History and Physical, chart, labs and discussed the procedure including the risks, benefits and alternatives for the proposed anesthesia with the patient or authorized representative who has indicated his/her understanding and acceptance.       Plan Discussed with: CRNA and Anesthesiologist  Anesthesia Plan Comments:         Anesthesia Quick Evaluation

## 2020-04-28 NOTE — H&P (Signed)
Paper H&P to be scanned into permanent record. H&P reviewed. No significant changes noted.  

## 2020-04-28 NOTE — Anesthesia Procedure Notes (Signed)
Anesthesia Regional Block: Interscalene brachial plexus block   Pre-Anesthetic Checklist: ,, timeout performed, Correct Patient, Correct Site, Correct Laterality, Correct Procedure, Correct Position, site marked, Risks and benefits discussed,  Surgical consent,  Pre-op evaluation,  At surgeon's request and post-op pain management  Laterality: Right  Prep: chloraprep       Needles:  Injection technique: Single-shot  Needle Type: Stimiplex     Needle Length: 10cm  Needle Gauge: 21     Additional Needles:   Procedures:,,,, ultrasound used (permanent image in chart),,,,  Narrative:  Injection made incrementally with aspirations every 5 mL.  Performed by: Personally  Anesthesiologist: Riel Hirschman, Fredric Dine, MD  Additional Notes: Functioning IV was confirmed and monitors applied. Ultrasound guidance: relevant anatomy identified, needle position confirmed, local anesthetic spread visualized around nerve(s)., vascular puncture avoided.  Image printed for medical record.  Negative aspiration and no paresthesias; incremental administration of local anesthetic. The patient tolerated the procedure well. Vitals signes recorded in RN notes.

## 2020-04-28 NOTE — Anesthesia Postprocedure Evaluation (Signed)
Anesthesia Post Note  Patient: Sequoya M Pearman  Procedure(s) Performed: Right shoulder arthroscopy with mini open revision rotator cuff repair with Regeneten patch (Right Shoulder)     Patient location during evaluation: PACU Anesthesia Type: General Level of consciousness: awake and alert Pain management: pain level controlled Vital Signs Assessment: post-procedure vital signs reviewed and stable Respiratory status: spontaneous breathing, nonlabored ventilation, respiratory function stable and patient connected to nasal cannula oxygen Cardiovascular status: blood pressure returned to baseline and stable Postop Assessment: no apparent nausea or vomiting Anesthetic complications: no   No complications documented.  Tyjanae Bartek A  Eldine Rencher

## 2020-04-28 NOTE — Anesthesia Procedure Notes (Signed)
Procedure Name: LMA Insertion Date/Time: 04/28/2020 7:42 AM Performed by: Cameron Ali, CRNA Pre-anesthesia Checklist: Patient identified, Emergency Drugs available, Suction available, Timeout performed and Patient being monitored Patient Re-evaluated:Patient Re-evaluated prior to induction Oxygen Delivery Method: Circle system utilized Preoxygenation: Pre-oxygenation with 100% oxygen Induction Type: IV induction LMA: LMA inserted LMA Size: 4.0 Number of attempts: 1 Placement Confirmation: positive ETCO2 and breath sounds checked- equal and bilateral Tube secured with: Tape Dental Injury: Teeth and Oropharynx as per pre-operative assessment

## 2020-04-28 NOTE — Discharge Instructions (Signed)
Post-Op Instructions - Rotator Cuff Repair  1. Bracing: You will wear a shoulder immobilizer or sling for 6 weeks.   2. Driving: No driving for 3 weeks post-op. When driving, do not wear the immobilizer. Ideally, we recommend no driving for 6 weeks while sling is in place as one arm will be immobilized.   3. Activity: No active lifting for 2 months. Wrist, hand, and elbow motion only. Avoid lifting the upper arm away from the body except for hygiene. You are permitted to bend and straighten the elbow passively only (no active elbow motion). You may use your hand and wrist for typing, writing, and managing utensils (cutting food). Do not lift more than a coffee cup for 8 weeks.  When sleeping or resting, inclined positions (recliner chair or wedge pillow) and a pillow under the forearm for support may provide better comfort for up to 4 weeks.  Avoid long distance travel for 4 weeks.  Return to normal activities after rotator cuff repair repair normally takes 6 months on average. If rehab goes very well, may be able to do most activities at 4 months, except overhead or contact sports.  4. Physical Therapy: Begins 3-4 days after surgery, and proceed 1 time per week for the first 6 weeks, then 1-2 times per week from weeks 6-20 post-op.  5. Medications:  - You will be provided a prescription for narcotic pain medicine. After surgery, take 1-2 narcotic tablets every 4 hours if needed for severe pain.  - A prescription for anti-nausea medication will be provided in case the narcotic medicine causes nausea - take 1 tablet every 6 hours only if nauseated.   - Take tylenol 1000 mg (2 Extra Strength tablets or 3 regular strength) every 8 hours for pain.  May decrease or stop tylenol 5 days after surgery if you are having minimal pain. - Take ASA 325mg/day x 2 weeks to help prevent DVTs/PEs (blood clots).  - DO NOT take ANY nonsteroidal anti-inflammatory pain medications (Advil, Motrin, Ibuprofen, Aleve,  Naproxen, or Naprosyn). These medicines can inhibit healing of your shoulder repair.    If you are taking prescription medication for anxiety, depression, insomnia, muscle spasm, chronic pain, or for attention deficit disorder, you are advised that you are at a higher risk of adverse effects with use of narcotics post-op, including narcotic addiction/dependence, depressed breathing, death. If you use non-prescribed substances: alcohol, marijuana, cocaine, heroin, methamphetamines, etc., you are at a higher risk of adverse effects with use of narcotics post-op, including narcotic addiction/dependence, depressed breathing, death. You are advised that taking > 50 morphine milligram equivalents (MME) of narcotic pain medication per day results in twice the risk of overdose or death. For your prescription provided: oxycodone 5 mg - taking more than 6 tablets per day would result in > 50 morphine milligram equivalents (MME) of narcotic pain medication. Be advised that we will prescribe narcotics short-term, for acute post-operative pain only - 3 weeks for major operations such as shoulder repair/reconstruction surgeries.     6. Post-Op Appointment:  Your first post-op appointment will be 10-14 days post-op.  7. Work or School: For most, but not all procedures, we advise staying out of work or school for at least 1 to 2 weeks in order to recover from the stress of surgery and to allow time for healing.   If you need a work or school note this can be provided.   8. Smoking: If you are a smoker, you need to refrain from   smoking in the postoperative period. The nicotine in cigarettes will inhibit healing of your shoulder repair and decrease the chance of successful repair. Similarly, nicotine containing products (gum, patches) should be avoided.   Post-operative Brace: Apply and remove the brace you received as you were instructed to at the time of fitting and as described in detail as the brace's  instructions for use indicate.  Wear the brace for the period of time prescribed by your physician.  The brace can be cleaned with soap and water and allowed to air dry only.  Should the brace result in increased pain, decreased feeling (numbness/tingling), increased swelling or an overall worsening of your medical condition, please contact your doctor immediately.  If an emergency situation occurs as a result of wearing the brace after normal business hours, please dial 911 and seek immediate medical attention.  Let your doctor know if you have any further questions about the brace issued to you. Refer to the shoulder sling instructions for use if you have any questions regarding the correct fit of your shoulder sling.  [employer] Customer Care for Troubleshooting: [phone]  Video that illustrates how to properly use a shoulder sling: "Instructions for Proper Use of an Orthopaedic Sling" https://www.youtube.com/watch?v=AHZpn_Xo45w        General Anesthesia, Adult, Care After This sheet gives you information about how to care for yourself after your procedure. Your health care provider may also give you more specific instructions. If you have problems or questions, contact your health care provider. What can I expect after the procedure? After the procedure, the following side effects are common:  Pain or discomfort at the IV site.  Nausea.  Vomiting.  Sore throat.  Trouble concentrating.  Feeling cold or chills.  Weak or tired.  Sleepiness and fatigue.  Soreness and body aches. These side effects can affect parts of the body that were not involved in surgery. Follow these instructions at home:  For at least 24 hours after the procedure:  Have a responsible adult stay with you. It is important to have someone help care for you until you are awake and alert.  Rest as needed.  Do not: ? Participate in activities in which you could fall or become injured. ? Drive. ? Use  heavy machinery. ? Drink alcohol. ? Take sleeping pills or medicines that cause drowsiness. ? Make important decisions or sign legal documents. ? Take care of children on your own. Eating and drinking  Follow any instructions from your health care provider about eating or drinking restrictions.  When you feel hungry, start by eating small amounts of foods that are soft and easy to digest (bland), such as toast. Gradually return to your regular diet.  Drink enough fluid to keep your urine pale yellow.  If you vomit, rehydrate by drinking water, juice, or clear broth. General instructions  If you have sleep apnea, surgery and certain medicines can increase your risk for breathing problems. Follow instructions from your health care provider about wearing your sleep device: ? Anytime you are sleeping, including during daytime naps. ? While taking prescription pain medicines, sleeping medicines, or medicines that make you drowsy.  Return to your normal activities as told by your health care provider. Ask your health care provider what activities are safe for you.  Take over-the-counter and prescription medicines only as told by your health care provider.  If you smoke, do not smoke without supervision.  Keep all follow-up visits as told by your health care provider. This   is important. Contact a health care provider if:  You have nausea or vomiting that does not get better with medicine.  You cannot eat or drink without vomiting.  You have pain that does not get better with medicine.  You are unable to pass urine.  You develop a skin rash.  You have a fever.  You have redness around your IV site that gets worse. Get help right away if:  You have difficulty breathing.  You have chest pain.  You have blood in your urine or stool, or you vomit blood. Summary  After the procedure, it is common to have a sore throat or nausea. It is also common to feel tired.  Have a  responsible adult stay with you for the first 24 hours after general anesthesia. It is important to have someone help care for you until you are awake and alert.  When you feel hungry, start by eating small amounts of foods that are soft and easy to digest (bland), such as toast. Gradually return to your regular diet.  Drink enough fluid to keep your urine pale yellow.  Return to your normal activities as told by your health care provider. Ask your health care provider what activities are safe for you. This information is not intended to replace advice given to you by your health care provider. Make sure you discuss any questions you have with your health care provider. Document Revised: 07/25/2017 Document Reviewed: 03/07/2017 Elsevier Patient Education  2020 [employer]  

## 2020-04-28 NOTE — Progress Notes (Signed)
Assisted Cindy Morrison ANMD  with right, ultrasound guided, interscalene  block. Side rails up, monitors on throughout procedure. See vital signs in flow sheet. Tolerated Procedure well.  

## 2020-04-28 NOTE — Op Note (Signed)
SURGERY DATE: 04/28/2020    PRE-OP DIAGNOSIS:  1. Right recurrent rotator cuff tear (supraspinatus)   POST-OP DIAGNOSIS: 1. Right recurrent rotator cuff tear (supraspinatus)   PROCEDURES:  1. Right revision mini-open rotator cuff repair and augmentation with Regeneten patch 2. Right arthroscopic extensive debridement of shoulder (glenohumeral and subacromial spaces)    SURGEON: Cato Mulligan, MD   ASSISTANT: Anitra Lauth, PA   ANESTHESIA: Gen with Exparil interscalene block    ESTIMATED BLOOD LOSS: 25cc   DRAINS:  none   TOTAL IV FLUIDS: per anesthesia      SPECIMENS: none   IMPLANTS:    -Arthrex 5.52mm SwiveLock anchor x 1 - [employer]ker Omega Knotless Anchor System, 4.47mm x 1 - [employer] Medium Regeneten Patch x 1   OPERATIVE FINDINGS:  Examination under anesthesia: A careful examination under anesthesia was performed.  Passive range of motion was: FF: 150; ER at side: 45; ER in abduction: 90; IR in abduction: 50.  Anterior load shift: NT.  Posterior load shift: NT.  Sulcus in neutral: NT.  Sulcus in ER: NT.     Intra-operative findings: A thorough arthroscopic examination of the shoulder was performed.  The findings are: 1. Biceps tendon:  Not visualized intra-articularly 2. Superior labrum: Normal 3. Posterior labrum and capsule: normal 4. Inferior capsule and inferior recess: normal 5. Glenoid cartilage surface: Focal area of grade 1-2 degenerative changes 6. Supraspinatus attachment: full-thickness tear 7. Posterior rotator cuff attachment: Normal 8. Humeral head articular cartilage: Normal 9. Rotator interval: Significant synovitis 10: Subscapularis tendon:  Attachment intact on the lesser tuberosity, mild fraying of the superior fibers 11. Anterior labrum: normal 12. IGHL: significant synovitis around IGHL   OPERATIVE REPORT:    Indications for procedure:  Cindy Morrison is a 64 y.o. female with a history of prior rotator cuff repair performed by me in May  2019.  She began to have recurrent pain approximately 3 months ago.  She underwent a course of activity modifications, medical management, and corticosteroid injection without complete relief of her symptoms.  Clinical exam and MRI were concerning for recurrent rotator cuff tear.  There was only mild atrophy of the supraspinatus.  Of note, she had quit smoking approximately 1.5 years ago. Given these findings, we decided to proceed with surgical management. After discussion of risks, benefits, and alternatives to surgery, the patient elected to proceed.    Procedure in detail:   I identified the patient in the pre-operative holding area.  I marked the operative shoulder with my initials. I reviewed the risks and benefits of the proposed surgical intervention, and the patient (and/or patient's guardian) wished to proceed.  Anesthesia was then performed with an Exparil interscalene block.  The patient was transferred to the operative suite and placed in the beach chair position.     SCDs were placed on the lower extremities. Appropriate IV antibiotics were administered prior to incision. The operative upper extremity was then prepped and draped in standard fashion. A time out was performed confirming the correct extremity, correct patient, and correct procedure.    I then created a standard posterior portal with an 11 blade. The glenohumeral joint was easily entered with a blunt trochar and the arthroscope introduced. The findings of diagnostic arthroscopy are described above.  A standard anterior portal was made.  I debrided degenerative tissue including the synovitic tissue about the rotator interval, glenoid, and superior fibers of the subscapularis.  I then coagulated the inflamed synovium in these regions  to obtain hemostasis and reduce the risk of post-operative swelling using an Arthrocare radiofrequency device.    Next, the arthroscope was then introduced into the subacromial space.    A direct  lateral portal was created with an 11-blade after spinal needle localization. An extensive debridement with subacromial bursectomy was performed using a combination of the shaver and Arthrocare wand.  The rotator cuff tear was visualized and the degenerative edges of the rotator cuff tendon were debrided with an oscillating shaver.  This concluded the arthroscopic portion of the procedure.   The prior mini-open longitudinal incision from the anterolateral acromion ~6cm in length was utilized. The raphe between the middle and anterior heads of the deltoid was identified, and it was incised. The subacromial space was identified. Any remaining bursa was excised.    The arm was then internally rotated.  The rotator cuff footprint was cleared of soft tissue.  All remnants of suture were removed.  The anterior anchor remained in place.  The posterior anchor pulled out of the bone creating a small hole at the articular margin.  A rongeur was used to gently decorticate the rotator cuff footprint to allow for improved healing.  A 5.5 mm Arthrex SwiveLock anchor was placed at the site of the posterior medial row anchor.  This was double loaded with suture tape.  The tear itself was an L-shaped tear with the longitudinal limb anteriorly.  Therefore the sutures from the anchor were passed in a side-to-side fashion.   The FiberWire suture from this anchor was removed and also passed in a side-to-side fashion laterally.  Furthermore, another suture tape was passed far laterally across the longitudinal limb of the L.  This was tied first.  The suture tapes from the medial row anchor were then tied next.  The medial most suture was then cut.  The 6 remaining strands (4 from the tied suture tapes and 2 from the FiberWire) were then passed through a 4.75 mm Omega anchor and fixed laterally.  This construct achieved full coverage of the rotator cuff footprint with appropriate compression.  An additional 2 suture tapes were passed  through the longitudinal split further reducing this tear.   This construct was stable to internal and external rotation.  Next, to augment this repair and reduce tension, a Regeneten patch was placed over the tear.  Appropriate tendon and bone staples were placed to achieve secure fixation.   The wound was thoroughly irrigated.  The deltoid split was closed with 0 Vicryl.  The subdermal layer was closed with 2-0 Vicryl.  The skin was closed with  4-0 Monocryl and Dermabond. The portals were closed with 3-0 Nylon. Xeroform was applied to the incisions. A sterile dressing was applied, followed by a Polar Care sleeve and a SlingShot shoulder immobilizer/sling. The patient was awakened from anesthesia without difficulty and was transferred to the PACU in stable condition.    Of note, assistance from a PA was essential to performing the surgery.  PA was present for the entire surgery.  PA assisted with patient positioning, retraction, instrumentation, and wound closure. The surgery would have been more difficult and had longer operative time without PA assistance.      COMPLICATIONS: none   DISPOSITION: plan for discharge home after recovery in PACU    POSTOPERATIVE PLAN: Remain in sling (except hygiene and elbow/wrist/hand RoM exercises as instructed by PT) x 6 weeks and NWB for this time. PT to begin 3-4 days after surgery.  Use large rotator  cuff repair rehab protocol.  ASA 325mg  daily x 2 weeks for DVT ppx.

## 2020-05-01 ENCOUNTER — Encounter: Payer: Self-pay | Admitting: Orthopedic Surgery

## 2021-02-15 ENCOUNTER — Other Ambulatory Visit: Payer: Self-pay | Admitting: Surgery

## 2021-02-21 ENCOUNTER — Other Ambulatory Visit
Admission: RE | Admit: 2021-02-21 | Discharge: 2021-02-21 | Disposition: A | Discharge status: Home / Self Care | Payer: Medicare Other | Source: Ambulatory Visit | Attending: Surgery | Admitting: Surgery

## 2021-02-21 ENCOUNTER — Other Ambulatory Visit: Payer: Self-pay

## 2021-02-21 DIAGNOSIS — Z01818 Encounter for other preprocedural examination: Secondary | ICD-10-CM | POA: Diagnosis not present

## 2021-02-21 LAB — CBC WITH DIFFERENTIAL/PLATELET
Abs Immature Granulocytes: 0.02 10*3/uL (ref 0.00–0.07)
Basophils Absolute: 0 10*3/uL (ref 0.0–0.1)
Basophils Relative: 1 %
Eosinophils Absolute: 0.2 10*3/uL (ref 0.0–0.5)
Eosinophils Relative: 3 %
HCT: 37.5 % (ref 36.0–46.0)
Hemoglobin: 13.2 g/dL (ref 12.0–15.0)
Immature Granulocytes: 0 %
Lymphocytes Relative: 35 %
Lymphs Abs: 2.3 10*3/uL (ref 0.7–4.0)
MCH: 31.1 pg (ref 26.0–34.0)
MCHC: 35.2 g/dL (ref 30.0–36.0)
MCV: 88.2 fL (ref 80.0–100.0)
Monocytes Absolute: 0.7 10*3/uL (ref 0.1–1.0)
Monocytes Relative: 11 %
Neutro Abs: 3.2 10*3/uL (ref 1.7–7.7)
Neutrophils Relative %: 50 %
Platelets: 252 10*3/uL (ref 150–400)
RBC: 4.25 MIL/uL (ref 3.87–5.11)
RDW: 13 % (ref 11.5–15.5)
WBC: 6.4 10*3/uL (ref 4.0–10.5)
nRBC: 0 % (ref 0.0–0.2)

## 2021-02-21 LAB — URINALYSIS, ROUTINE W REFLEX MICROSCOPIC
Bacteria, UA: NONE SEEN
Bilirubin Urine: NEGATIVE
Glucose, UA: NEGATIVE mg/dL
Hgb urine dipstick: NEGATIVE
Ketones, ur: NEGATIVE mg/dL
Nitrite: NEGATIVE
Protein, ur: NEGATIVE mg/dL
Specific Gravity, Urine: 1.021 (ref 1.005–1.030)
pH: 6 (ref 5.0–8.0)

## 2021-02-21 LAB — COMPREHENSIVE METABOLIC PANEL
ALT: 31 U/L (ref 0–44)
AST: 26 U/L (ref 15–41)
Albumin: 4.3 g/dL (ref 3.5–5.0)
Alkaline Phosphatase: 77 U/L (ref 38–126)
Anion gap: 10 (ref 5–15)
BUN: 21 mg/dL (ref 8–23)
CO2: 25 mmol/L (ref 22–32)
Calcium: 9.8 mg/dL (ref 8.9–10.3)
Chloride: 102 mmol/L (ref 98–111)
Creatinine, Ser: 0.77 mg/dL (ref 0.44–1.00)
GFR, Estimated: >60 mL/min (ref >60)
Glucose, Bld: 109 mg/dL — ABNORMAL HIGH (ref 70–99)
Potassium: 4.3 mmol/L (ref 3.5–5.1)
Sodium: 137 mmol/L (ref 135–145)
Total Bilirubin: 0.8 mg/dL (ref 0.3–1.2)
Total Protein: 7.2 g/dL (ref 6.5–8.1)

## 2021-02-21 LAB — TYPE AND SCREEN
ABO/RH(D): O POS
Antibody Screen: NEGATIVE

## 2021-02-21 LAB — SURGICAL PCR SCREEN
MRSA, PCR: NEGATIVE
Staphylococcus aureus: NEGATIVE

## 2021-02-21 NOTE — Patient Instructions (Addendum)
Your procedure is scheduled on:03/01/2021 Report to the Registration Desk on the 1st floor of the [facility]. To find out your arrival time, please call [phone] between 1PM - 3PM on: 02/28/2021  REMEMBER: Instructions that are not followed completely may result in serious medical risk, up to and including death; or upon the discretion of your surgeon and anesthesiologist your surgery may need to be rescheduled.  Do not eat food after midnight the night before surgery.  No gum chewing, lozengers or hard candies.  You may however, drink CLEAR liquids up to 2 hours before you are scheduled to arrive for your surgery. Do not drink anything within 2 hours of your scheduled arrival time.  Clear liquids include: - water  - apple juice without pulp - gatorade (not RED, PURPLE, OR BLUE) - black coffee or tea (Do NOT add milk or creamers to the coffee or tea) Do NOT drink anything that is not on this list.    In addition, your doctor has ordered for you to drink the provided  Ensure Pre-Surgery Clear Carbohydrate Drink   Drinking this carbohydrate drink up to two hours before surgery helps to reduce insulin resistance and improve patient outcomes. Please complete drinking 2 hours prior to scheduled arrival time.  TAKE THESE MEDICATIONS THE MORNING OF SURGERY WITH A SIP OF WATER:  atorvastatin (LIPITOR) buPROPion (WELLBUTRIN)  3.   omeprazole (PRILOSEC) -take this night before and morning of surgery               Do not take medications the morning of surgery not on the list above.      One week prior to surgery: Stop Anti-inflammatories (NSAIDS) such as Advil, Aleve, Ibuprofen, Motrin, Naproxen, Naprosyn and Aspirin based products such as Excedrin, Goodys Powder, BC Powder,Lodine Stop ANY OVER THE COUNTER supplements until after surgery like calcium, vit d -3, cinnamon,multivitamin,omega  3, turmeric,b-12, vit C, Zinc, You may however, continue to take Tylenol if needed for pain  up until the day of surgery.  No Alcohol for 24 hours before or after surgery.  No Smoking including e-cigarettes for 24 hours prior to surgery.  No chewable tobacco products for at least 6 hours prior to surgery.  No nicotine patches on the day of surgery.  Do not use any "recreational" drugs for at least a week prior to your surgery.  Please be advised that the combination of cocaine and anesthesia may have negative outcomes, up to and including death. If you test positive for cocaine, your surgery will be cancelled.  On the morning of surgery brush your teeth with toothpaste and water, you may rinse your mouth with mouthwash if you wish. Do not swallow any toothpaste or mouthwash.  Do not wear jewelry, make-up, hairpins, clips or nail polish.  Do not wear lotions, powders, or perfumes.   Do not shave body from the neck down 48 hours prior to surgery just in case you cut yourself which could leave a site for infection.  Also, freshly shaved skin may become irritated if using the CHG soap.  Contact lenses, hearing aids and dentures may not be worn into surgery.  Do not bring valuables to the hospital. [facility] is not responsible for any missing/lost belongings or valuables.   Use CHG Soap or wipes as directed on instruction sheet.   Notify your doctor if there is any change in your medical condition (cold, fever, infection).  Wear comfortable clothing (specific to your surgery type) to the hospital.  After surgery, you can help prevent lung complications by doing breathing exercises.  Take deep breaths and cough every 1-2 hours. Your doctor may order a device called an Incentive Spirometer to help you take deep breaths. If you are being admitted to the hospital overnight, leave your suitcase in the car. After surgery it may be brought to your room.  If you are being discharged the day of surgery, you will not be allowed to drive home. You will need a responsible adult (18  years or older) to drive you home and stay with you that night.    Please call the [facility] at [phone] if you have any questions about these instructions.  Surgery Visitation Policy:  Patients undergoing a surgery or procedure may have one family member or support person with them as long as that person is not COVID-19 positive or experiencing its symptoms.  That person may remain in the waiting area during the procedure.  Inpatient Visitation:    Visiting hours are 7 a.m. to 8 p.m. Inpatients will be allowed two visitors daily. The visitors may change each day during the patient's stay. No visitors under the age of 44. Any visitor under the age of 79 must be accompanied by an adult. The visitor must pass COVID-19 screenings, use hand sanitizer when entering and exiting the patient's room and wear a mask at all times, including in the patient's room. Patients must also wear a mask when staff or their visitor are in the room. Masking is required regardless of vaccination status.

## 2021-02-27 ENCOUNTER — Other Ambulatory Visit: Payer: Self-pay

## 2021-02-27 ENCOUNTER — Other Ambulatory Visit
Admission: RE | Admit: 2021-02-27 | Discharge: 2021-02-27 | Disposition: A | Discharge status: Home / Self Care | Payer: Medicare Other | Source: Ambulatory Visit | Attending: Surgery | Admitting: Surgery

## 2021-02-27 DIAGNOSIS — Z20822 Contact with and (suspected) exposure to covid-19: Secondary | ICD-10-CM | POA: Diagnosis not present

## 2021-02-27 DIAGNOSIS — Z01812 Encounter for preprocedural laboratory examination: Secondary | ICD-10-CM | POA: Diagnosis present

## 2021-02-27 LAB — SARS CORONAVIRUS 2 (TAT 6-24 HRS): SARS Coronavirus 2: NEGATIVE

## 2021-03-01 ENCOUNTER — Inpatient Hospital Stay: Payer: Medicare Other | Admitting: Anesthesiology

## 2021-03-01 ENCOUNTER — Encounter
Admission: RE | Disposition: A | Discharge status: Home / Self Care | Payer: Self-pay | Source: Home / Self Care | Attending: Surgery

## 2021-03-01 ENCOUNTER — Other Ambulatory Visit: Payer: Self-pay

## 2021-03-01 ENCOUNTER — Inpatient Hospital Stay: Payer: Medicare Other | Admitting: Urgent Care

## 2021-03-01 ENCOUNTER — Encounter: Payer: Self-pay | Admitting: Surgery

## 2021-03-01 ENCOUNTER — Inpatient Hospital Stay: Payer: Medicare Other

## 2021-03-01 ENCOUNTER — Ambulatory Visit
Admission: RE | Admit: 2021-03-01 | Discharge: 2021-03-01 | Disposition: A | Discharge status: Home / Self Care | Payer: Medicare Other | Attending: Surgery | Admitting: Surgery

## 2021-03-01 DIAGNOSIS — Z881 Allergy status to other antibiotic agents status: Secondary | ICD-10-CM | POA: Diagnosis not present

## 2021-03-01 DIAGNOSIS — Z791 Long term (current) use of non-steroidal anti-inflammatories (NSAID): Secondary | ICD-10-CM | POA: Insufficient documentation

## 2021-03-01 DIAGNOSIS — Z79899 Other long term (current) drug therapy: Secondary | ICD-10-CM | POA: Insufficient documentation

## 2021-03-01 DIAGNOSIS — Z87891 Personal history of nicotine dependence: Secondary | ICD-10-CM | POA: Diagnosis not present

## 2021-03-01 DIAGNOSIS — M1712 Unilateral primary osteoarthritis, left knee: Secondary | ICD-10-CM | POA: Diagnosis not present

## 2021-03-01 DIAGNOSIS — Z96652 Presence of left artificial knee joint: Secondary | ICD-10-CM

## 2021-03-01 HISTORY — PX: TOTAL KNEE ARTHROPLASTY: SHX125

## 2021-03-01 LAB — ABO/RH: ABO/RH(D): O POS

## 2021-03-01 SURGERY — ARTHROPLASTY, KNEE, TOTAL
Anesthesia: Spinal | Site: Knee | Laterality: Left

## 2021-03-01 MED ORDER — FENTANYL CITRATE (PF) 100 MCG/2ML IJ SOLN
25.0000 ug | INTRAMUSCULAR | Status: DC | PRN
  Administered 2021-03-01 (×3): 25 ug via INTRAVENOUS

## 2021-03-01 MED ORDER — MIDAZOLAM HCL 5 MG/5ML IJ SOLN
INTRAMUSCULAR | Status: DC | PRN
  Administered 2021-03-01: 2 mg via INTRAVENOUS

## 2021-03-01 MED ORDER — LACTATED RINGERS IV SOLN: INTRAVENOUS | Status: DC

## 2021-03-01 MED ORDER — ROCURONIUM BROMIDE 100 MG/10ML IV SOLN
INTRAVENOUS | Status: DC | PRN
  Administered 2021-03-01: 30 mg via INTRAVENOUS
  Administered 2021-03-01 (×2): 20 mg via INTRAVENOUS

## 2021-03-01 MED ORDER — SUGAMMADEX SODIUM 200 MG/2ML IV SOLN
INTRAVENOUS | Status: DC | PRN
  Administered 2021-03-01: 200 mg via INTRAVENOUS

## 2021-03-01 MED ORDER — METOCLOPRAMIDE HCL 5 MG/ML IJ SOLN: 5.0000 mg | Freq: Three times a day (TID) | INTRAMUSCULAR | Status: DC | PRN

## 2021-03-01 MED ORDER — KETOROLAC TROMETHAMINE 15 MG/ML IJ SOLN
INTRAMUSCULAR | Status: AC
  Filled 2021-03-01: qty 1

## 2021-03-01 MED ORDER — ACETAMINOPHEN 10 MG/ML IV SOLN
INTRAVENOUS | Status: AC
  Filled 2021-03-01: qty 100

## 2021-03-01 MED ORDER — SODIUM CHLORIDE 0.9 % BOLUS PEDS
250.0000 mL | Freq: Once | INTRAVENOUS | Status: AC
  Administered 2021-03-01: 250 mL via INTRAVENOUS

## 2021-03-01 MED ORDER — KETAMINE HCL 10 MG/ML IJ SOLN
INTRAMUSCULAR | Status: DC | PRN
  Administered 2021-03-01 (×2): 20 mg via INTRAVENOUS

## 2021-03-01 MED ORDER — OXYCODONE HCL 5 MG PO TABS: 5.0000 mg | ORAL_TABLET | ORAL | 0 refills | Status: DC | PRN

## 2021-03-01 MED ORDER — ONDANSETRON HCL 4 MG/2ML IJ SOLN
INTRAMUSCULAR | Status: DC | PRN
  Administered 2021-03-01 (×2): 4 mg via INTRAVENOUS

## 2021-03-01 MED ORDER — SODIUM CHLORIDE 0.9 % IV SOLN
INTRAVENOUS | Status: DC | PRN
  Administered 2021-03-01: 20 ug/min via INTRAVENOUS

## 2021-03-01 MED ORDER — KETAMINE HCL 50 MG/ML IJ SOLN
INTRAMUSCULAR | Status: AC
  Filled 2021-03-01: qty 1

## 2021-03-01 MED ORDER — SUCCINYLCHOLINE CHLORIDE 200 MG/10ML IV SOSY
PREFILLED_SYRINGE | INTRAVENOUS | Status: DC | PRN
  Administered 2021-03-01: 100 mg via INTRAVENOUS

## 2021-03-01 MED ORDER — CEFAZOLIN SODIUM-DEXTROSE 2-4 GM/100ML-% IV SOLN
2.0000 g | INTRAVENOUS | Status: AC
  Administered 2021-03-01: 2 g via INTRAVENOUS

## 2021-03-01 MED ORDER — BUPIVACAINE-EPINEPHRINE (PF) 0.5% -1:200000 IJ SOLN
INTRAMUSCULAR | Status: DC | PRN
  Administered 2021-03-01: 30 mL

## 2021-03-01 MED ORDER — FENTANYL CITRATE (PF) 100 MCG/2ML IJ SOLN
INTRAMUSCULAR | Status: DC | PRN
  Administered 2021-03-01 (×4): 50 ug via INTRAVENOUS

## 2021-03-01 MED ORDER — CEFAZOLIN SODIUM-DEXTROSE 2-4 GM/100ML-% IV SOLN: 2.0000 g | Freq: Four times a day (QID) | INTRAVENOUS | Status: DC

## 2021-03-01 MED ORDER — BUPIVACAINE-EPINEPHRINE (PF) 0.5% -1:200000 IJ SOLN
INTRAMUSCULAR | Status: AC
  Filled 2021-03-01: qty 30

## 2021-03-01 MED ORDER — LIDOCAINE HCL (CARDIAC) PF 100 MG/5ML IV SOSY
PREFILLED_SYRINGE | INTRAVENOUS | Status: DC | PRN
  Administered 2021-03-01: 100 mg via INTRAVENOUS

## 2021-03-01 MED ORDER — KETOROLAC TROMETHAMINE 15 MG/ML IJ SOLN
15.0000 mg | Freq: Once | INTRAMUSCULAR | Status: AC
  Administered 2021-03-01: 15 mg via INTRAVENOUS

## 2021-03-01 MED ORDER — METOPROLOL TARTRATE 5 MG/5ML IV SOLN
INTRAVENOUS | Status: DC | PRN
  Administered 2021-03-01: 1 mg via INTRAVENOUS
  Administered 2021-03-01: 2 mg via INTRAVENOUS
  Administered 2021-03-01: 1 mg via INTRAVENOUS
  Administered 2021-03-01: 2 mg via INTRAVENOUS

## 2021-03-01 MED ORDER — OXYCODONE HCL 5 MG PO TABS
ORAL_TABLET | ORAL | Status: AC
  Filled 2021-03-01: qty 1

## 2021-03-01 MED ORDER — SODIUM CHLORIDE 0.9 % IV SOLN: INTRAVENOUS | Status: DC

## 2021-03-01 MED ORDER — CEFAZOLIN SODIUM-DEXTROSE 2-4 GM/100ML-% IV SOLN
INTRAVENOUS | Status: AC
  Administered 2021-03-01: 2 g via INTRAVENOUS
  Filled 2021-03-01: qty 100

## 2021-03-01 MED ORDER — HYDROCODONE-ACETAMINOPHEN 7.5-325 MG PO TABS
ORAL_TABLET | ORAL | Status: AC
  Filled 2021-03-01: qty 1

## 2021-03-01 MED ORDER — METOCLOPRAMIDE HCL 10 MG PO TABS: 5.0000 mg | ORAL_TABLET | Freq: Three times a day (TID) | ORAL | Status: DC | PRN

## 2021-03-01 MED ORDER — PHENYLEPHRINE HCL (PRESSORS) 10 MG/ML IV SOLN
INTRAVENOUS | Status: DC | PRN
  Administered 2021-03-01: 200 ug via INTRAVENOUS
  Administered 2021-03-01: 10 ug via INTRAVENOUS
  Administered 2021-03-01: 200 ug via INTRAVENOUS

## 2021-03-01 MED ORDER — ONDANSETRON HCL 4 MG/2ML IJ SOLN: 4.0000 mg | Freq: Four times a day (QID) | INTRAMUSCULAR | Status: DC | PRN

## 2021-03-01 MED ORDER — TRANEXAMIC ACID 1000 MG/10ML IV SOLN
INTRAVENOUS | Status: DC | PRN
  Administered 2021-03-01: 1000 mg via TOPICAL

## 2021-03-01 MED ORDER — STERILE WATER FOR IRRIGATION IR SOLN
Status: DC | PRN
  Administered 2021-03-01: 1000 mL

## 2021-03-01 MED ORDER — MIDAZOLAM HCL 2 MG/2ML IJ SOLN
INTRAMUSCULAR | Status: AC
  Filled 2021-03-01: qty 2

## 2021-03-01 MED ORDER — ORAL CARE MOUTH RINSE: 15.0000 mL | Freq: Once | OROMUCOSAL | Status: AC

## 2021-03-01 MED ORDER — CEFAZOLIN SODIUM-DEXTROSE 2-4 GM/100ML-% IV SOLN
INTRAVENOUS | Status: AC
  Filled 2021-03-01: qty 100

## 2021-03-01 MED ORDER — OXYCODONE HCL 5 MG PO TABS
5.0000 mg | ORAL_TABLET | ORAL | Status: DC | PRN
  Administered 2021-03-01: 5 mg via ORAL

## 2021-03-01 MED ORDER — SODIUM CHLORIDE FLUSH 0.9 % IV SOLN
INTRAVENOUS | Status: AC
  Filled 2021-03-01: qty 10

## 2021-03-01 MED ORDER — FENTANYL CITRATE (PF) 100 MCG/2ML IJ SOLN
INTRAMUSCULAR | Status: AC
  Filled 2021-03-01: qty 2

## 2021-03-01 MED ORDER — CHLORHEXIDINE GLUCONATE 0.12 % MT SOLN: 15.0000 mL | Freq: Once | OROMUCOSAL | Status: AC

## 2021-03-01 MED ORDER — CHLORHEXIDINE GLUCONATE 0.12 % MT SOLN
OROMUCOSAL | Status: AC
  Administered 2021-03-01: 15 mL via OROMUCOSAL
  Filled 2021-03-01: qty 15

## 2021-03-01 MED ORDER — SODIUM CHLORIDE 0.9 % IR SOLN
Status: DC | PRN
  Administered 2021-03-01: 3000 mL

## 2021-03-01 MED ORDER — FENTANYL CITRATE (PF) 100 MCG/2ML IJ SOLN
INTRAMUSCULAR | Status: AC
  Administered 2021-03-01: 25 ug via INTRAVENOUS
  Filled 2021-03-01: qty 2

## 2021-03-01 MED ORDER — DEXMEDETOMIDINE (PRECEDEX) IN NS 20 MCG/5ML (4 MCG/ML) IV SYRINGE
PREFILLED_SYRINGE | INTRAVENOUS | Status: DC | PRN
  Administered 2021-03-01: 8 ug via INTRAVENOUS

## 2021-03-01 MED ORDER — ONDANSETRON HCL 4 MG PO TABS: 4.0000 mg | ORAL_TABLET | Freq: Four times a day (QID) | ORAL | Status: DC | PRN

## 2021-03-01 MED ORDER — 0.9 % SODIUM CHLORIDE (POUR BTL) OPTIME
TOPICAL | Status: DC | PRN
  Administered 2021-03-01: 500 mL

## 2021-03-01 MED ORDER — APIXABAN 2.5 MG PO TABS: 2.5000 mg | ORAL_TABLET | Freq: Two times a day (BID) | ORAL | 0 refills | Status: DC

## 2021-03-01 MED ORDER — SODIUM CHLORIDE 0.9 % IV SOLN
INTRAVENOUS | Status: DC | PRN
  Administered 2021-03-01: 60 mL

## 2021-03-01 MED ORDER — TRANEXAMIC ACID 1000 MG/10ML IV SOLN
INTRAVENOUS | Status: AC
  Filled 2021-03-01: qty 10

## 2021-03-01 MED ORDER — SODIUM CHLORIDE FLUSH 0.9 % IV SOLN
INTRAVENOUS | Status: AC
  Filled 2021-03-01: qty 40

## 2021-03-01 MED ORDER — HYDROCODONE-ACETAMINOPHEN 7.5-325 MG PO TABS
1.0000 | ORAL_TABLET | Freq: Once | ORAL | Status: AC | PRN
  Administered 2021-03-01: 1 via ORAL

## 2021-03-01 MED ORDER — BUPIVACAINE LIPOSOME 1.3 % IJ SUSP
INTRAMUSCULAR | Status: AC
  Filled 2021-03-01: qty 20

## 2021-03-01 MED ORDER — ACETAMINOPHEN 500 MG PO TABS: 1000.0000 mg | ORAL_TABLET | Freq: Four times a day (QID) | ORAL | Status: DC

## 2021-03-01 MED ORDER — ACETAMINOPHEN 10 MG/ML IV SOLN
INTRAVENOUS | Status: DC | PRN
  Administered 2021-03-01: 1000 mg via INTRAVENOUS

## 2021-03-01 MED ORDER — ESMOLOL HCL 100 MG/10ML IV SOLN
INTRAVENOUS | Status: DC | PRN
Start: 2021-03-01 — End: 2021-03-01
  Administered 2021-03-01: 20 mg via INTRAVENOUS

## 2021-03-01 MED ORDER — PROPOFOL 10 MG/ML IV BOLUS
INTRAVENOUS | Status: DC | PRN
  Administered 2021-03-01: 150 mg via INTRAVENOUS

## 2021-03-01 SURGICAL SUPPLY — 64 items
APL PRP STRL LF DISP 70% ISPRP (MISCELLANEOUS) ×1
BIOMET QUICK RELEASE DRILL BITS ×2 IMPLANT
BIT DRILL QUICK REL 1/8 2PK SL (DRILL) ×1 IMPLANT
BLADE SAW SAG 25X90X1.19 (BLADE) ×2 IMPLANT
BLADE SURG SZ20 CARB STEEL (BLADE) ×2 IMPLANT
BNDG CMPR STD VLCR NS LF 5.8X6 (GAUZE/BANDAGES/DRESSINGS) ×1
BNDG ELASTIC 6X5.8 VLCR NS LF (GAUZE/BANDAGES/DRESSINGS) ×2 IMPLANT
BRNG TIB 0D 75X10 ANT STAB (Insert) ×1 IMPLANT
CEMENT BONE R 1X40 (Cement) ×4 IMPLANT
CEMENT VACUUM MIXING SYSTEM (MISCELLANEOUS) ×2 IMPLANT
CHLORAPREP W/TINT 26 (MISCELLANEOUS) ×2 IMPLANT
COMPONENT PATELLAR VGD 7.8X3 (Joint) ×2 IMPLANT
COOLER POLAR GLACIER W/PUMP (MISCELLANEOUS) ×2 IMPLANT
COVER MAYO STAND REUSABLE (DRAPES) ×2 IMPLANT
CUFF TOURN SGL QUICK 24 (TOURNIQUET CUFF) ×2
CUFF TOURN SGL QUICK 34 (TOURNIQUET CUFF)
CUFF TRNQT CYL 24X4X16.5-23 (TOURNIQUET CUFF) ×1 IMPLANT
CUFF TRNQT CYL 34X4.125X (TOURNIQUET CUFF) IMPLANT
DRAPE 3/4 80X56 (DRAPES) ×2 IMPLANT
DRAPE IMP U-DRAPE 54X76 (DRAPES) ×2 IMPLANT
DRILL QUICK RELEASE 1/8 INCH (DRILL) ×1
DRSG MEPILEX SACRM 8.7X9.8 (GAUZE/BANDAGES/DRESSINGS) IMPLANT
DRSG OPSITE POSTOP 4X10 (GAUZE/BANDAGES/DRESSINGS) ×2 IMPLANT
DRSG OPSITE POSTOP 4X8 (GAUZE/BANDAGES/DRESSINGS) ×2 IMPLANT
ELECT REM PT RETURN 9FT ADLT (ELECTROSURGICAL) ×2
ELECTRODE REM PT RTRN 9FT ADLT (ELECTROSURGICAL) ×1 IMPLANT
FEMORAL CR LEFT  70MM (Joint) ×1 IMPLANT
FEMORAL CR LEFT 70MM (Joint) ×1 IMPLANT
GAUZE 4X4 16PLY ~~LOC~~+RFID DBL (SPONGE) ×2 IMPLANT
GAUZE XEROFORM 1X8 LF (GAUZE/BANDAGES/DRESSINGS) ×2 IMPLANT
GLOVE SRG 8 PF TXTR STRL LF DI (GLOVE) ×1 IMPLANT
GLOVE SURG ENC MOIS LTX SZ7.5 (GLOVE) ×8 IMPLANT
GLOVE SURG ENC MOIS LTX SZ8 (GLOVE) ×8 IMPLANT
GLOVE SURG UNDER LTX SZ8 (GLOVE) ×2 IMPLANT
GLOVE SURG UNDER POLY LF SZ8 (GLOVE) ×2
GOWN STRL REUS W/ TWL LRG LVL3 (GOWN DISPOSABLE) ×1 IMPLANT
GOWN STRL REUS W/ TWL XL LVL3 (GOWN DISPOSABLE) ×1 IMPLANT
GOWN STRL REUS W/TWL LRG LVL3 (GOWN DISPOSABLE) ×2
GOWN STRL REUS W/TWL XL LVL3 (GOWN DISPOSABLE) ×2
HOOD PEEL AWAY FLYTE STAYCOOL (MISCELLANEOUS) ×6 IMPLANT
INSERT TIB BEARING 75 10 (Insert) ×2 IMPLANT
IV NS IRRIG 3000ML ARTHROMATIC (IV SOLUTION) ×2 IMPLANT
KIT TURNOVER KIT A (KITS) ×2 IMPLANT
MANIFOLD NEPTUNE II (INSTRUMENTS) ×2 IMPLANT
NEEDLE SPNL 20GX3.5 QUINCKE YW (NEEDLE) ×2 IMPLANT
NS IRRIG 1000ML POUR BTL (IV SOLUTION) ×2 IMPLANT
PACK TOTAL KNEE (MISCELLANEOUS) ×2 IMPLANT
PAD WRAPON POLAR KNEE (MISCELLANEOUS) ×1 IMPLANT
PENCIL SMOKE EVACUATOR (MISCELLANEOUS) ×2 IMPLANT
PLATE KNEE TIBIAL 75MM FIXED (Plate) ×2 IMPLANT
PULSAVAC PLUS IRRIG FAN TIP (DISPOSABLE) ×2
SPONGE T-LAP 18X18 ~~LOC~~+RFID (SPONGE) ×6 IMPLANT
STAPLER SKIN PROX 35W (STAPLE) ×2 IMPLANT
SUCTION FRAZIER HANDLE 10FR (MISCELLANEOUS) ×1
SUCTION TUBE FRAZIER 10FR DISP (MISCELLANEOUS) ×1 IMPLANT
SUT VIC AB 0 CT1 36 (SUTURE) ×6 IMPLANT
SUT VIC AB 2-0 CT1 27 (SUTURE) ×6
SUT VIC AB 2-0 CT1 TAPERPNT 27 (SUTURE) ×3 IMPLANT
SYR 10ML LL (SYRINGE) ×2 IMPLANT
SYR 20ML LL LF (SYRINGE) ×2 IMPLANT
SYR 30ML LL (SYRINGE) IMPLANT
TIP FAN IRRIG PULSAVAC PLUS (DISPOSABLE) ×1 IMPLANT
TRAP FLUID SMOKE EVACUATOR (MISCELLANEOUS) ×2 IMPLANT
WRAPON POLAR PAD KNEE (MISCELLANEOUS) ×2

## 2021-03-01 NOTE — Discharge Instructions (Addendum)
Orthopedic discharge instructions: May shower with intact OpSite dressing and assistance. Apply ice frequently to knee or use Polar Care. Start Eliquis 2.5 mg twice daily for 2 weeks on Friday, 03/02/2021, then take aspirin 325 mg daily for 4 weeks. Take oxycodone as prescribed when needed.  May supplement with ES Tylenol if necessary. May weight-bear as tolerated on left leg - use walker for balance and support. Start home physical therapy Friday or Saturday as scheduled. Follow-up in 10-14 days or as scheduled.AMBULATORY SURGERY  DISCHARGE INSTRUCTIONS   The drugs that you were given will stay in your system until tomorrow so for the next 24 hours you should not:  Drive an automobile Make any legal decisions Drink any alcoholic beverage   You may resume regular meals tomorrow.  Today it is better to start with liquids and gradually work up to solid foods.  You may eat anything you prefer, but it is better to start with liquids, then soup and crackers, and gradually work up to solid foods.   Please notify your doctor immediately if you have any unusual bleeding, trouble breathing, redness and pain at the surgery site, drainage, fever, or pain not relieved by medication.     Your post-operative visit with Dr.                                       is: Date:                        Time:    Please call to schedule your post-operative visit.  Additional Instructions:

## 2021-03-01 NOTE — Transfer of Care (Signed)
Immediate Anesthesia Transfer of Care Note  Patient: Cindy Morrison  Procedure(s) Performed: TOTAL KNEE ARTHROPLASTY - RNFA (Left: Knee)  Patient Location: PACU  Anesthesia Type:General  Level of Consciousness: awake, drowsy and patient cooperative  Airway & Oxygen Therapy: Patient Spontanous Breathing and Patient connected to face mask oxygen  Post-op Assessment: Report given to RN and Post -op Vital signs reviewed and stable  Post vital signs: Reviewed and stable  Last Vitals:  Vitals Value Taken Time  BP    Temp    Pulse    Resp    SpO2      Last Pain:  Vitals:   03/01/21 0812  TempSrc: Tympanic  PainSc: 0-No pain      Patients Stated Pain Goal: 0 ([id number])  Complications: No notable events documented.

## 2021-03-01 NOTE — H&P (Signed)
History of Present Illness:  Cindy Morrison is a 65 y.o. female who presents for follow-up of her bilateral knee pain secondary to degenerative joint disease. The patient was last seen for the left knee symptoms 10 months ago by myself and for the right knee symptoms 8 months ago by Reche Dixon, PA-C. At these visits, she received steroid injections into the left and right knee respectively. She notes that these injections provided little if any relief of her symptoms. She continues to experience moderate pain in both knees which she rates at 4/10 on today's visit. Her symptoms are aggravated by any prolonged standing or ambulation, as well as with ascending/descending stairs. She has been taking Lodine on a daily basis, supplemented with Tylenol with limited benefit. She denies any recent reinjury to either knee, and denies any numbness or paresthesias down her legs to her feet. She is quite frustrated by her persistent symptoms and functional limitations, and is now ready to consider more aggressive treatment options.  Current Outpatient Medications:  acetaminophen (TYLENOL) 500 MG tablet Take 1,000 mg by mouth as needed for Pain   atorvastatin (LIPITOR) 10 MG tablet Take 1 tablet (10 mg total) by mouth once daily 30 tablet 11   betamethasone dipropionate (DIPROSONE) 0.05 % ointment Apply topically 2 (two) times daily 45 g 5   buPROPion (WELLBUTRIN) 75 MG tablet TAKE 2 TABLETS (150 MG TOTAL) BY MOUTH 2 (TWO) TIMES DAILY 360 tablet 1   calcium carbonate 1250 MG capsule Take 1,250 mg by mouth 2 (two) times daily with meals.   cholecalciferol (VITAMIN D3) 1000 unit tablet Take 1,000 Units by mouth once daily.   cinnamon bark 500 mg capsule Take 500 mg by mouth 2 (two) times daily   cyanocobalamin (VITAMIN B12) 1000 MCG tablet Take 1,000 mcg by mouth once daily.   etodolac (LODINE) 400 MG tablet TAKE 1 TABLET BY MOUTH TWICE A DAY 180 tablet 1   LORazepam (ATIVAN) 0.5 MG tablet TAKE 1 TABLET BY MOUTH EVERY 8  HOURS AS NEEDED FOR ANXIETY. 90 tablet 3   losartan (COZAAR) 25 MG tablet Take 1 tablet (25 mg total) by mouth once daily 30 tablet 11   multivitamin tablet Take 1 tablet by mouth once daily   omega-3 fatty acids/fish oil 340-1,000 mg capsule Take 1 capsule by mouth 2 (two) times daily   omeprazole (PRILOSEC) 40 MG DR capsule Take 1 capsule (40 mg total) by mouth once daily 90 capsule 3   pilocarpine (SALAGEN) 5 mg tablet Take 1 tablet (5 mg total) by mouth 3 (three) times daily 90 tablet 5   turmeric root extract 500 mg Cap Take 1 tablet by mouth once daily.   venlafaxine (EFFEXOR-XR) 150 MG XR capsule TAKE 1 CAPSULE (150 MG TOTAL) BY MOUTH ONCE DAILY 90 capsule 1   zolpidem (AMBIEN) 5 MG tablet TAKE 1 TABLET BY MOUTH EVERY DAY AT BEDTIME AS NEEDED FOR SLEEP 30 tablet 3   Allergies:   Erythromycin Ethylsuccinate Other (Affected eye)   Past Medical History:   Anemia   Anxiety   Arthritis   Brain tumor (CMS-HCC)   Choroid melanoma of left eye (CMS-HCC)   Chronic back pain   Eczema, unspecified   Gastroesophageal reflux disease 08/25/2014   GERD (gastroesophageal reflux disease)   Lumbar radiculitis 05/02/2016   Psoriasis   Situational depression   Past Surgical History:   Arthroscopic subacromial decompression left shoulder 10/22/07   BRAIN SURGERY   COLONOSCOPY 12/22/2007 (Int Hemorrhoids: CBF 12/2017)  EGD 12/22/2007, 09/25/1995 (Gastritis: No repeat per RTE)   EYE SURGERY   eye tumor, 2012   FLEXIBLE SIGMOIDOSCOPY 09/03/1995 (Int Hemorrhoids)   HYSTERECTOMY (still has ovaries)   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   right mini- open rotator cuff repair including supraspinatus, infraspinatus, & subscapularis, open biceps tenodesis, distal clavicel excision, extensive debridement of shoulder (glenohumeral & subacromial spaces) subacromial decompression Right 12/18/2017 (Dr. Posey Pronto)   Right revision mini-open rotator cuff repair and augmentation with Regeneten  patch, Rt arthroscopic extensive debridement of shoulder (glenohumeral and subacromial spaces) Right 04/28/2020 (Dr. Posey Pronto)   Family History:   Lung cancer Father   Myocardial Infarction (Heart attack) Father   Cancer Father   Asthma Sister   ADD / ADHD Daughter   Breast cancer Maternal Grandmother   Arthritis Mother   Hip fracture Mother   High blood pressure (Hypertension) Mother   Osteoporosis (Thinning of bones) Mother   Alzheimer's disease Maternal Grandfather   Social History:   Socioeconomic History:   Marital status: Married  Occupational History   Occupation: Retired  Tobacco Use   Smoking status: Former Smoker  Packs/day: 1.00  Years: 30.00  Pack years: 30.00  Types: Cigarettes  Quit date: 11/01/2018  Years since quitting: 2.2   Smokeless tobacco: Never Used  Vaping Use   Vaping Use: Former  Substance and Sexual Activity   Alcohol use: Yes  Alcohol/week: 0.0 standard drinks  Comment: socially   Drug use: No   Sexual activity: Yes  Partners: Male  Birth control/protection: None   Review of Systems:  A comprehensive 14 point ROS was performed, reviewed, and the pertinent orthopaedic findings are documented in the HPI.  Physical Exam: Vitals:  02/12/21 0951  BP: (!) 142/80  Weight: 77.9 kg (171 lb 12.8 oz)  Height: 165.1 cm ('5\' 5"'$ )  PainSc: 4  PainLoc: Knee   General/Constitutional: The patient appears to be well-nourished, well-developed, and in no acute distress. Neuro/Psych: Normal mood and affect, oriented to person, place and time. Eyes: Non-icteric. Pupils are equal, round, and reactive to light, and exhibit synchronous movement. ENT: Unremarkable. Lymphatic: No palpable adenopathy. Respiratory: Lungs clear to auscultation, Normal chest excursion, No wheezes and Non-labored breathing Cardiovascular: Regular rate and rhythm. No murmurs. and No edema, swelling or tenderness, except as noted in detailed exam. Integumentary: No impressive skin  lesions present, except as noted in detailed exam. Musculoskeletal: Unremarkable, except as noted in detailed exam.  Left knee exam: GAIT: Patient ambulates with a minimal limp, but does not use any assistive devices. ALIGNMENT: Normal SKIN: Unremarkable SWELLING: Mild EFFUSION: Small WARMTH: None TENDERNESS: Mildly tender along medial and lateral joint lines ROM: 0-120 degrees with mild discomfort in maximal flexion McMURRAY'S: Negative PATELLOFEMORAL: Normal tracking with no peri-patellar tenderness and negative apprehension sign CREPITUS: None LACHMAN'S: Negative PIVOT SHIFT: Negative ANTERIOR DRAWER: Negative POSTERIOR DRAWER: Negative VARUS/VALGUS: Stable   She is neurovascularly intact to the left lower extremity and foot.  X-rays/MRI/Lab data:  Standing AP and lateral x-rays of the left knee, as well as a sunrise view, are obtained. These films demonstrate significant degenerative changes involving both the medial and lateral compartments with complete loss of the medial and lateral compartment clear spaces. The patellofemoral compartment appears to be well-maintained. Overall alignment is neutral, although the femur appears to be shifted somewhat medially in relation to the tibia. No lytic lesions or fractures are identified.  Assessment: Primary osteoarthritis of left knee.   Plan: The treatment options were discussed with the  patient and her husband. In addition, patient educational materials were provided regarding the diagnosis and treatment options. The patient has become increasingly frustrated by her symptoms and functional limitations, and is ready to consider more aggressive treatment options. Although she feels that her knees are equally symptomatic, x-rays suggest that she has worse arthritis in her left knee. Therefore, I have recommended a surgical procedure, specifically a left total knee arthroplasty. The procedure was discussed with the patient, as were the  potential risks (including bleeding, infection, nerve and/or blood vessel injury, persistent or recurrent pain, loosening and/or failure of the components, dislocation, need for further surgery, blood clots, strokes, heart attacks and/or arhythmias, pneumonia, etc.) and benefits. The patient states his/her understanding and wishes to proceed. All of the patient's questions and concerns were answered. She can call any time with further concerns. She will follow up post-surgery, routine.   H&P reviewed and patient re-examined. No changes.

## 2021-03-01 NOTE — Anesthesia Postprocedure Evaluation (Signed)
Anesthesia Post Note  Patient: Cindy Morrison  Procedure(s) Performed: TOTAL KNEE ARTHROPLASTY - RNFA (Left: Knee)  Patient location during evaluation: PACU Anesthesia Type: General Level of consciousness: awake and alert Pain management: pain level controlled Vital Signs Assessment: post-procedure vital signs reviewed and stable Respiratory status: spontaneous breathing, nonlabored ventilation, respiratory function stable and patient connected to nasal cannula oxygen Cardiovascular status: blood pressure returned to baseline and stable Postop Assessment: no apparent nausea or vomiting Anesthetic complications: no   No notable events documented.   Last Vitals:  Vitals:   03/01/21 1415 03/01/21 1430  BP: 116/90 117/82  Pulse: 90 78  Resp: 13 11  Temp:    SpO2: 94% 94%    Last Pain:  Vitals:   03/01/21 1415  TempSrc:   PainSc: Asleep                 Margaree Mackintosh

## 2021-03-01 NOTE — Op Note (Signed)
03/01/2021  1:18 PM  Patient:   Cindy Morrison  Pre-Op Diagnosis:   Degenerative joint disease, left knee.  Post-Op Diagnosis:   Same  Procedure:   Left TKA using all-cemented [employer] Vanguard system with a 70 mm PCR femur, a 75 mm tibial tray with a 10 mm anterior stabilized E-poly insert, and a 34 x 7.8 mm all-poly 3-pegged domed patella.  Surgeon:   Pascal Lux, MD  Assistant:   Waylan Boga, RNFA; Ardelle Park, PA-S  Anesthesia:   Attempted spinal to GET  Findings:   As above  Complications:   None  EBL:   10 cc  Fluids:   300 cc crystalloid  UOP:   None  TT:   100 minutes at 300 mmHg  Drains:   None  Closure:   Staples  Implants:   As above  Brief Clinical Note:   The patient is a 65 year old female with a long history of progressively worsening left knee pain. The patient's symptoms have progressed despite medications, activity modification, injections, etc. The patient's history and examination were consistent with advanced degenerative joint disease of the left knee confirmed by plain radiographs. The patient presents at this time for a left total knee arthroplasty.  Procedure:   The patient was brought into the operating room. After an attempt at spinal anesthesia was made, the patient was repositioned in the supine position and underwent successful general endotracheal intubation and anesthesia. The left lower extremity was prepped with ChloraPrep solution and draped sterilely. Preoperative antibiotics were administered. After verifying the proper laterality with a surgical timeout, the limb was exsanguinated with an Esmarch and the tourniquet inflated to 300 mmHg.   A standard anterior approach to the knee was made through an approximately 6-7 inch incision. The incision was carried down through the subcutaneous tissues to expose superficial retinaculum. This was split the length of the incision and the medial flap elevated sufficiently to expose the medial  retinaculum. The medial retinaculum was incised, leaving a 3-4 mm cuff of tissue on the patella. This was extended distally along the medial border of the patellar tendon and proximally through the medial third of the quadriceps tendon. A subtotal fat pad excision was performed before the soft tissues were elevated off the anteromedial and anterolateral aspects of the proximal tibia to the level of the collateral ligaments. The anterior portions of the medial and lateral menisci were removed, as was the anterior cruciate ligament. With the knee flexed to 90, the external tibial guide was positioned and the appropriate proximal tibial cut made. This piece was taken to the back table where it was measured and found to be optimally replicated by a 75 mm component.  Attention was directed to the distal femur. The intramedullary canal was accessed through a 3/8" drill hole. The intramedullary guide was inserted and positioned in order to obtain a neutral flexion gap. The intercondylar block was positioned with care taken to avoid notching the anterior cortex of the femur. The appropriate cut was made. Next, the distal cutting block was placed at 5 of valgus alignment. Using the 9 mm slot, the distal cut was made. The distal femur was measured and found to be optimally replicated by the 70 mm component. The 70 mm 4-in-1 cutting block was positioned and first the posterior, then the posterior chamfer, the anterior chamfer, and finally the anterior cuts were made. At this point, the posterior portions medial and lateral menisci were removed. A trial reduction was performed using  the appropriate femoral and tibial components with the 10 mm insert. This demonstrated excellent stability to varus and valgus stressing both in flexion and extension while permitting full extension. Patella tracking was assessed and found to be excellent. Therefore, the tibial guide position was marked on the proximal tibia. The patella  thickness was measured and found to be 21 mm. Therefore, the appropriate cut was made. The patellar surface was measured and found to be optimally replicated by the 34 mm component. The three peg holes were drilled in place before the trial button was inserted. Patella tracking was assessed and found to be excellent, passing the "no thumb test". The lug holes were drilled into the distal femur before the trial component was removed, leaving only the tibial tray. The keel was then created using the appropriate tower, reamer, and punch.  The bony surfaces were prepared for cementing by irrigating them thoroughly with sterile saline solution via the jet lavage system. A bone plug was fashioned from some of the bone that had been removed previously and used to plug the distal femoral canal. In addition, 20 cc of Exparel diluted out to 60 cc with normal saline and 30 cc of 0.5% Sensorcaine were injected into the postero-medial and postero-lateral aspects of the knee, the medial and lateral gutter regions, and the peri-incisional tissues to help with postoperative analgesia. Meanwhile, the cement was being mixed on the back table. When it was ready, the tibial tray was cemented in first. The excess cement was removed using [employer] fast streamer. Next, the femoral component was impacted into place. Again, the excess cement was removed using [employer] fast streamer. The 10 mm trial insert was positioned and the knee brought into extension while the cement hardened. Finally, the patella was cemented into place and secured using the patellar clamp. Again, the excess cement was removed using [employer] fast streamer. Once the cement had hardened, the knee was placed through a range of motion with the findings as described above. Therefore, the trial insert was removed and, after verifying that no cement had been retained posteriorly, the permanent 10 mm anterior stabilized E-polyethylene insert was positioned and secured using the appropriate key  locking mechanism. Again the knee was placed through a range of motion with the findings as described above.  The wound was copiously irrigated with sterile saline solution using the jet lavage system before the quadriceps tendon and retinacular layer were reapproximated using #0 Vicryl interrupted sutures. The superficial retinacular layer also was closed using a running #0 Vicryl suture. A total of 10 cc of transexemic acid (TXA) was injected intra-articularly before the subcutaneous tissues were closed in several layers using 2-0 Vicryl interrupted sutures. The skin was closed using staples. A sterile honeycomb dressing was applied to the skin before the leg was wrapped with an Ace wrap to accommodate the Polar Care device. The patient was then awakened, extubated, and returned to the recovery room in satisfactory condition after tolerating the procedure well.

## 2021-03-01 NOTE — Anesthesia Preprocedure Evaluation (Addendum)
Anesthesia Evaluation  Patient identified by MRN, date of birth, ID band Patient awake    Reviewed: Allergy & Precautions, NPO status , Patient's Chart, lab work & pertinent test results  Airway Mallampati: III  TM Distance: >3 FB Neck ROM: full    Dental no notable dental hx.    Pulmonary neg pulmonary ROS, former smoker,    Pulmonary exam normal        Cardiovascular negative cardio ROS Normal cardiovascular exam     Neuro/Psych Anxiety negative neurological ROS  negative psych ROS   GI/Hepatic Neg liver ROS, GERD  Medicated,  Endo/Other  negative endocrine ROS  Renal/GU      Musculoskeletal   Abdominal   Peds  Hematology negative hematology ROS (+)   Anesthesia Other Findings Past Medical History: No date: Anxiety No date: Cancer [facility])     Comment:  choroid melanoma left eye No date: Choroid melanoma of left eye (HCC) No date: GERD (gastroesophageal reflux disease) No date: Psoriasis No date: Wears contact lenses  Past Surgical History: No date: ABDOMINAL HYSTERECTOMY 12/18/2017: BICEPT TENODESIS; Right     Comment:  Procedure: BICEPS TENODESIS;  Surgeon: Leim Fabry, MD;              Location: [facility] ORS;  Service: Orthopedics;  Laterality:               Right; 2007: BRAIN TUMOR EXCISION No date: COLONOSCOPY No date: ESOPHAGOGASTRODUODENOSCOPY 2012: EYE SURGERY 2018: MOHS SURGERY 12/18/2017: SHOULDER ARTHROSCOPY WITH OPEN ROTATOR CUFF REPAIR; Right     Comment:  Procedure: SHOULDER ARTHROSCOPY WITH OPEN ROTATOR CUFF               REPAIR,DISTAL CLAVICLE EXCISION,SUBACROMIAL               DECOMPRESSION;  Surgeon: Leim Fabry, MD;  Location:               [facility] ORS;  Service: Orthopedics;  Laterality: Right; 04/28/2020: SHOULDER ARTHROSCOPY WITH OPEN ROTATOR CUFF REPAIR; Right     Comment:  Procedure: Right shoulder arthroscopy with mini open               revision rotator cuff repair with Regeneten  patch;                Surgeon: Leim Fabry, MD;  Location: [place];  Service: Orthopedics;  Laterality: Right;  BMI    Body Mass Index: 27.86 kg/m      Reproductive/Obstetrics negative OB ROS                            Anesthesia Physical Anesthesia Plan  ASA: 2  Anesthesia Plan: Spinal   Post-op Pain Management:    Induction:   PONV Risk Score and Plan:   Airway Management Planned: Natural Airway and Nasal Cannula  Additional Equipment:   Intra-op Plan:   Post-operative Plan:   Informed Consent: I have reviewed the patients History and Physical, chart, labs and discussed the procedure including the risks, benefits and alternatives for the proposed anesthesia with the patient or authorized representative who has indicated his/her understanding and acceptance.     Dental Advisory Given  Plan Discussed with: Anesthesiologist, CRNA and Surgeon  Anesthesia Plan Comments: (Patient reports no bleeding problems and no anticoagulant use.  Plan for spinal with backup GA  Patient consented for risks  of anesthesia including but not limited to:  - adverse reactions to medications - damage to eyes, teeth, lips or other oral mucosa - nerve damage due to positioning  - risk of bleeding, infection and or nerve damage from spinal that could lead to paralysis - risk of headache or failed spinal - damage to teeth, lips or other oral mucosa - sore throat or hoarseness - damage to heart, brain, nerves, lungs, other parts of body or loss of life  Patient voiced understanding.)        Anesthesia Quick Evaluation

## 2021-03-01 NOTE — Evaluation (Signed)
Physical Therapy Evaluation Patient Details Name: Cindy Morrison MRN: [id number] DOB: Aug 02, 1956 Today's Date: 03/01/2021   History of Present Illness  Pt is POD 0 for L TKR. History includes anxiety, GERD, and reflux.  Clinical Impression  Pt is a pleasant 65 year old female who was admitted for L TKR. Pt performs bed mobility, transfers, and ambulation with cga and RW. Pt demonstrates deficits with strength/mobility/pain. Pt demonstrates ability to perform 10 SLRs with independence, therefore does not require KI for mobility. Safe technique with stair training and ROM. No further needs for acute care PT. Scheduled for HHPT on Sat (7/30). Will plan to dc in house at this time.    Follow Up Recommendations Home health PT    Equipment Recommendations  None recommended by PT    Recommendations for Other Services       Precautions / Restrictions Precautions Precautions: Knee;Fall Precaution Booklet Issued: Yes (comment) Restrictions Weight Bearing Restrictions: Yes LLE Weight Bearing: Weight bearing as tolerated      Mobility  Bed Mobility Overal bed mobility: Needs Assistance Bed Mobility: Supine to Sit     Supine to sit: Min guard     General bed mobility comments: safe technique with upright posture once seated at EOB.    Transfers Overall transfer level: Needs assistance Equipment used: Rolling walker (2 wheeled) Transfers: Sit to/from Stand Sit to Stand: Min guard         General transfer comment: cues for hand placement. Once standing, RW used  Ambulation/Gait Ambulation/Gait assistance: Min Conservator, museum/gallery (Feet): 100 Feet Assistive device: Rolling walker (2 wheeled) Gait Pattern/deviations: Step-through pattern     General Gait Details: slow and cautious ambulation down hallway with decreased heel strike noted. Antalgic gait pattern performed with improvement as distance increased  Stairs Stairs: Yes Stairs assistance: Min guard Stair  Management: One rail Right;Step to pattern Number of Stairs: 1 General stair comments: up/down with safe technique and cga.  Wheelchair Mobility    Modified Rankin (Stroke Patients Only)       Balance Overall balance assessment: Needs assistance Sitting-balance support: Feet supported Sitting balance-Leahy Scale: Good     Standing balance support: Bilateral upper extremity supported Standing balance-Leahy Scale: Good                               Pertinent Vitals/Pain Pain Assessment: 0-10 Pain Score: 8  Pain Location: L knee Pain Descriptors / Indicators: Operative site guarding Pain Intervention(s): Limited activity within patient's tolerance;Ice applied;Premedicated before session;Repositioned    Home Living Family/patient expects to be discharged to:: Private residence Living Arrangements: Spouse/significant other Available Help at Discharge: Family Type of Home: House Home Access: [place]: One level (1 small step to get into living room) Home Equipment: [place]ford Rile - 2 wheels;Bedside commode;Shower seat      Prior Function Level of Independence: Independent         Comments: active, 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     Hand Dominance        Extremity/Trunk Assessment   Upper Extremity Assessment Upper Extremity Assessment: Overall WFL for tasks assessed    Lower Extremity Assessment Lower Extremity Assessment: Generalized weakness (L LE grossly 3/5; R LE grossly 5/5)       Communication   Communication: No difficulties  Cognition Arousal/Alertness: Awake/alert Behavior During Therapy: WFL for tasks assessed/performed Overall Cognitive Status: Within Functional Limits  for tasks assessed                                        General Comments      Exercises Total Joint Exercises Goniometric ROM: L knee AAROM: 12-88 degrees Other Exercises Other Exercises: seated ther-ex  performed on L LE including AP, quad sets, SLRs, hip abd/add, LAQ, and seated knee flexion. 10 reps performed with supervision Other Exercises: ambulated to bathroom with cga. Min assist for transfers on/off toilet. RW used. Supervision for hygiene   Assessment/Plan    PT Assessment All further PT needs can be met in the next venue of care  PT Problem List Decreased strength;Decreased range of motion;Decreased mobility;Decreased knowledge of use of DME;Pain       PT Treatment Interventions      PT Goals (Current goals can be found in the Care Plan section)  Acute Rehab PT Goals Patient Stated Goal: to go home PT Goal Formulation: All assessment and education complete, DC therapy Time For Goal Achievement: 03/01/21 Potential to Achieve Goals: Good    Frequency     Barriers to discharge        Co-evaluation               AM-PAC PT "6 Clicks" Mobility  Outcome Measure Help needed turning from your back to your side while in a flat bed without using bedrails?: A Little Help needed moving from lying on your back to sitting on the side of a flat bed without using bedrails?: A Little Help needed moving to and from a bed to a chair (including a wheelchair)?: A Little Help needed standing up from a chair using your arms (e.g., wheelchair or bedside chair)?: A Little Help needed to walk in hospital room?: A Little Help needed climbing 3-5 steps with a railing? : A Little 6 Click Score: 18    End of Session Equipment Utilized During Treatment: Gait belt Activity Tolerance: Patient tolerated treatment well Patient left: in chair;with family/visitor present Nurse Communication: Mobility status PT Visit Diagnosis: Muscle weakness (generalized) (M62.81);Difficulty in walking, not elsewhere classified (R26.2);Pain Pain - Right/Left: Left Pain - part of body: Knee    Time: 1539-1620 PT Time Calculation (min) (ACUTE ONLY): 41 min   Charges:   PT Evaluation $PT Eval Low  Complexity: 1 Low PT Treatments $Therapeutic Exercise: 8-22 mins $Therapeutic Activity: 8-22 mins        Cindy Morrison, PT, DPT [phone]   Cindy Morrison 03/01/2021, 4:45 PM

## 2021-03-01 NOTE — Anesthesia Procedure Notes (Signed)
Procedure Name: Intubation Date/Time: 03/01/2021 10:57 AM Performed by: Kelton Pillar, CRNA Pre-anesthesia Checklist: Patient identified, Emergency Drugs available, Suction available and Patient being monitored Patient Re-evaluated:Patient Re-evaluated prior to induction Oxygen Delivery Method: Circle system utilized Preoxygenation: Pre-oxygenation with 100% oxygen Induction Type: IV induction Ventilation: Mask ventilation without difficulty Laryngoscope Size: McGraph and 3 Grade View: Grade I Tube type: Oral Tube size: 6.5 mm Number of attempts: 1 Airway Equipment and Method: Stylet and Oral airway Placement Confirmation: ETT inserted through vocal cords under direct vision, positive ETCO2, breath sounds checked- equal and bilateral and CO2 detector Secured at: 21 cm Tube secured with: Tape Dental Injury: Teeth and Oropharynx as per pre-operative assessment

## 2021-03-02 ENCOUNTER — Encounter: Payer: Self-pay | Admitting: Surgery

## 2021-03-04 ENCOUNTER — Emergency Department: Payer: Medicare Other

## 2021-03-04 ENCOUNTER — Emergency Department
Admission: EM | Admit: 2021-03-04 | Discharge: 2021-03-05 | Disposition: A | Discharge status: Home / Self Care | Payer: Medicare Other | Attending: Emergency Medicine | Admitting: Emergency Medicine

## 2021-03-04 ENCOUNTER — Other Ambulatory Visit: Payer: Self-pay

## 2021-03-04 DIAGNOSIS — R197 Diarrhea, unspecified: Secondary | ICD-10-CM

## 2021-03-04 DIAGNOSIS — E871 Hypo-osmolality and hyponatremia: Secondary | ICD-10-CM | POA: Diagnosis not present

## 2021-03-04 DIAGNOSIS — R6883 Chills (without fever): Secondary | ICD-10-CM | POA: Insufficient documentation

## 2021-03-04 DIAGNOSIS — R109 Unspecified abdominal pain: Secondary | ICD-10-CM | POA: Diagnosis not present

## 2021-03-04 DIAGNOSIS — Z87891 Personal history of nicotine dependence: Secondary | ICD-10-CM | POA: Insufficient documentation

## 2021-03-04 DIAGNOSIS — K219 Gastro-esophageal reflux disease without esophagitis: Secondary | ICD-10-CM | POA: Insufficient documentation

## 2021-03-04 DIAGNOSIS — Z20822 Contact with and (suspected) exposure to covid-19: Secondary | ICD-10-CM | POA: Diagnosis not present

## 2021-03-04 DIAGNOSIS — Z96652 Presence of left artificial knee joint: Secondary | ICD-10-CM | POA: Diagnosis not present

## 2021-03-04 DIAGNOSIS — R0602 Shortness of breath: Secondary | ICD-10-CM | POA: Insufficient documentation

## 2021-03-04 DIAGNOSIS — Z85840 Personal history of malignant neoplasm of eye: Secondary | ICD-10-CM | POA: Diagnosis not present

## 2021-03-04 DIAGNOSIS — Z7901 Long term (current) use of anticoagulants: Secondary | ICD-10-CM | POA: Insufficient documentation

## 2021-03-04 LAB — CBC WITH DIFFERENTIAL/PLATELET
Abs Immature Granulocytes: 0.08 10*3/uL — ABNORMAL HIGH (ref 0.00–0.07)
Basophils Absolute: 0.1 10*3/uL (ref 0.0–0.1)
Basophils Relative: 0 %
Eosinophils Absolute: 0.1 10*3/uL (ref 0.0–0.5)
Eosinophils Relative: 1 %
HCT: 31.6 % — ABNORMAL LOW (ref 36.0–46.0)
Hemoglobin: 10.8 g/dL — ABNORMAL LOW (ref 12.0–15.0)
Immature Granulocytes: 1 %
Lymphocytes Relative: 4 %
Lymphs Abs: 0.7 10*3/uL (ref 0.7–4.0)
MCH: 31.4 pg (ref 26.0–34.0)
MCHC: 34.2 g/dL (ref 30.0–36.0)
MCV: 91.9 fL (ref 80.0–100.0)
Monocytes Absolute: 1.6 10*3/uL — ABNORMAL HIGH (ref 0.1–1.0)
Monocytes Relative: 10 %
Neutro Abs: 13.8 10*3/uL — ABNORMAL HIGH (ref 1.7–7.7)
Neutrophils Relative %: 84 %
Platelets: 228 10*3/uL (ref 150–400)
RBC: 3.44 MIL/uL — ABNORMAL LOW (ref 3.87–5.11)
RDW: 13.2 % (ref 11.5–15.5)
WBC: 16.3 10*3/uL — ABNORMAL HIGH (ref 4.0–10.5)
nRBC: 0 % (ref 0.0–0.2)

## 2021-03-04 LAB — RESP PANEL BY RT-PCR (FLU A&B, COVID) ARPGX2
Influenza A by PCR: NEGATIVE
Influenza B by PCR: NEGATIVE
SARS Coronavirus 2 by RT PCR: NEGATIVE

## 2021-03-04 LAB — LACTIC ACID, PLASMA: Lactic Acid, Venous: 1.4 mmol/L (ref 0.5–1.9)

## 2021-03-04 MED ORDER — OXYCODONE HCL 5 MG PO TABS
5.0000 mg | ORAL_TABLET | Freq: Once | ORAL | Status: AC
Start: 2021-03-04 — End: 2021-03-04
  Administered 2021-03-04: 5 mg via ORAL
  Filled 2021-03-04: qty 1

## 2021-03-04 MED ORDER — ACETAMINOPHEN 500 MG PO TABS
1000.0000 mg | ORAL_TABLET | Freq: Once | ORAL | Status: AC
  Administered 2021-03-04: 1000 mg via ORAL
  Filled 2021-03-04: qty 2

## 2021-03-04 MED ORDER — LACTATED RINGERS IV BOLUS
1000.0000 mL | Freq: Once | INTRAVENOUS | Status: AC
  Administered 2021-03-04: 1000 mL via INTRAVENOUS

## 2021-03-04 NOTE — ED Triage Notes (Signed)
Pt came from with a knee replacement and c/o sob and knee pain; 92% on RA; oral temp 99.7

## 2021-03-04 NOTE — ED Provider Notes (Signed)
[facility] Emergency Department Provider Note  ____________________________________________  Time seen: Approximately 11:26 PM  I have reviewed the triage vital signs and the nursing notes.   HISTORY  Chief Complaint Shortness of Breath   HPI Cindy Morrison is a 65 y.o. female postop day 3 from a left total knee arthroplasty not on anticoagulations, melanoma of the left eye, GERD who presents for evaluation of shortness of breath and chills.  Patient reports that she went to bed feeling all right.  Woke up and had a episode of diarrhea and severe abdominal cramping, she was incontinent of stool in bed before getting to the bathroom. She reports that the pain was severe that she was doubled over.  Then she started feeling extremely cold and having chills and started complaining of shortness of breath.  According to her husband patient has been on oxycodone since the surgery and was constipated.  She had some MiraLAX earlier today.  After having a large BM, her symptoms resolved. She denies any chest pain and reports that her shortness of breath feels improved at this time.  She reports that she was told to be on a oral anticoagulation medication since the surgery but she has not been able to afford it therefore she is to only taking it aspirin twice daily.  She denies any prior history of PE or DVT.  She denies any changes in the swelling and the pain that she has had since the surgery.  She denies cough or congestion, wheezing, sore throat, body aches.  Her husband does report that she had a fever of 101.48F at home this evening.  Past Medical History:  Diagnosis Date   Anxiety    Cancer ([place])    choroid melanoma left eye   Choroid melanoma of left eye (HCC)    GERD (gastroesophageal reflux disease)    Psoriasis    Wears contact lenses     Patient Active Problem List   Diagnosis Date Noted   Septic shock ([place])    Acute respiratory failure with hypoxia ([place])     Intractable nausea and vomiting 11/01/2018    Past Surgical History:  Procedure Laterality Date   ABDOMINAL HYSTERECTOMY     BICEPT TENODESIS Right 12/18/2017   Procedure: BICEPS TENODESIS;  Surgeon: Leim Fabry, MD;  Location: [facility];  Service: Orthopedics;  Laterality: Right;   BRAIN TUMOR EXCISION  2007   COLONOSCOPY     ESOPHAGOGASTRODUODENOSCOPY     EYE SURGERY  2012   MOHS SURGERY  2018   SHOULDER ARTHROSCOPY WITH OPEN ROTATOR CUFF REPAIR Right 12/18/2017   Procedure: SHOULDER ARTHROSCOPY WITH OPEN ROTATOR CUFF REPAIR,DISTAL CLAVICLE EXCISION,SUBACROMIAL DECOMPRESSION;  Surgeon: Leim Fabry, MD;  Location: [facility];  Service: Orthopedics;  Laterality: Right;   SHOULDER ARTHROSCOPY WITH OPEN ROTATOR CUFF REPAIR Right 04/28/2020   Procedure: Right shoulder arthroscopy with mini open revision rotator cuff repair with Regeneten patch;  Surgeon: Leim Fabry, MD;  Location: [place];  Service: Orthopedics;  Laterality: Right;   TOTAL KNEE ARTHROPLASTY Left 03/01/2021   Procedure: TOTAL KNEE ARTHROPLASTY - RNFA;  Surgeon: Corky Mull, MD;  Location: [facility];  Service: Orthopedics;  Laterality: Left;    Prior to Admission medications   Medication Sig Start Date End Date Taking? Authorizing Provider  apixaban (ELIQUIS) 2.5 MG TABS tablet Take 1 tablet (2.5 mg total) by mouth 2 (two) times daily. 03/02/21   Poggi, Marshall Cork, MD  atorvastatin (LIPITOR) 10 MG tablet Take  10 mg by mouth in the morning. 11/28/20   [provider]  betamethasone dipropionate (DIPROLENE) 0.05 % ointment Apply 1 application topically 2 (two) times daily as needed (eczema). For psoriasis 10/27/17   [provider]  buPROPion (WELLBUTRIN) 75 MG tablet Take 1 tablet (75 mg total) by mouth 2 (two) times daily. May restart once she is more clear in her mental state. Patient taking differently: Take 150 mg by mouth 2 (two) times daily. 11/22/18   Bettey Costa, MD  calcium carbonate (OSCAL) 1500  (600 Ca) MG TABS tablet Take 600 mg of elemental calcium by mouth in the morning and at bedtime.    [provider]  Cholecalciferol (VITAMIN D-3) 125 MCG (5000 UT) TABS Take 5,000 Units by mouth at bedtime.    [provider]  CINNAMON PO Take 1,000 mg by mouth every evening.    [provider]  diphenhydrAMINE HCl, Sleep, (WAL-SLEEP Z) 25 MG TBDP Take 25 mg by mouth at bedtime.    [provider]  LORazepam (ATIVAN) 0.5 MG tablet Take 0.5 mg by mouth every 8 (eight) hours as needed for anxiety. 10/27/17   [provider]  losartan (COZAAR) 25 MG tablet Take 25 mg by mouth in the morning. 12/05/20   [provider]  Multiple Vitamin (MULTIVITAMIN WITH MINERALS) TABS tablet Take 1 tablet by mouth in the morning. Centrum Silver for Women    [provider]  Omega-3 Fatty Acids (FISH OIL PO) Take 600 mg by mouth in the morning and at bedtime.    [provider]  omeprazole (PRILOSEC) 40 MG capsule Take 40 mg by mouth daily before breakfast.    [provider]  oxyCODONE (ROXICODONE) 5 MG immediate release tablet Take 1-2 tablets (5-10 mg total) by mouth every 4 (four) hours as needed for moderate pain or severe pain. 03/01/21   Poggi, Marshall Cork, MD  TURMERIC PO Take 538 mg by mouth at bedtime.    [provider]  venlafaxine XR (EFFEXOR-XR) 150 MG 24 hr capsule Take 150 mg by mouth every evening.    [provider]  vitamin B-12 (CYANOCOBALAMIN) 500 MCG tablet Take 500 mcg by mouth in the morning.    [provider]  vitamin C (ASCORBIC ACID) 500 MG tablet Take 500 mg by mouth in the morning.    [provider]  Zinc 50 MG TABS Take 50 mg by mouth at bedtime.    [provider]  zolpidem (AMBIEN) 5 MG tablet Take 5 mg by mouth at bedtime. 11/07/17   [provider]    Allergies Erythromycin base  Family History  Problem Relation Age of Onset   Heart attack Father    Lung  cancer Father     Social History Social History   Tobacco Use   Smoking status: Former    Packs/day: 1.00    Types: Cigarettes    Quit date: 08/02/2018    Years since quitting: 2.5   Smokeless tobacco: Never  Vaping Use   Vaping Use: Former  Substance Use Topics   Alcohol use: Yes    Comment: occasionally   Drug use: Never    Review of Systems  Constitutional: + fever. Eyes: Negative for visual changes. ENT: Negative for sore throat. Neck: No neck pain  Cardiovascular: Negative for chest pain. Respiratory: + shortness of breath. Gastrointestinal: Negative for abdominal pain, vomiting. + diarrhea. Genitourinary: Negative for dysuria. Musculoskeletal: Negative for back pain. Skin: Negative for rash.  Neurological: Negative for headaches, weakness or numbness. Psych: No SI or HI  ____________________________________________   PHYSICAL EXAM:  VITAL SIGNS: Vitals:   03/05/21 0030 03/05/21 0150  BP: 105/69 111/70  Pulse: (!) 106 98  Resp: 16 18  Temp:    SpO2: 94% 96%     Constitutional: Alert and oriented. Well appearing and in no apparent distress. HEENT:      Head: Normocephalic and atraumatic.         Eyes: Conjunctivae are normal. Sclera is non-icteric.       Mouth/Throat: Mucous membranes are moist.       Neck: Supple with no signs of meningismus. Cardiovascular: Tachycardic with regular rhythm Respiratory: Normal respiratory effort. Lungs are clear to auscultation bilaterally.  Gastrointestinal: Soft, non tender, and non distended with positive bowel sounds. No rebound or guarding. Musculoskeletal: Surgical dressing still in place over the left knee, left lower extremity is swollen with asymmetric pitting edema, patient has decent range of motion with no significant pain.   Neurologic: Normal speech and language. Face is symmetric. Moving all extremities. No gross focal neurologic deficits are appreciated. Skin: Skin is warm, dry and intact. No rash  noted. Psychiatric: Mood and affect are normal. Speech and behavior are normal.  ____________________________________________   LABS (all labs ordered are listed, but only abnormal results are displayed)  Labs Reviewed  CBC WITH DIFFERENTIAL/PLATELET - Abnormal; Notable for the following components:      Result Value   WBC 16.3 (*)    RBC 3.44 (*)    Hemoglobin 10.8 (*)    HCT 31.6 (*)    Neutro Abs 13.8 (*)    Monocytes Absolute 1.6 (*)    Abs Immature Granulocytes 0.08 (*)    All other components within normal limits  COMPREHENSIVE METABOLIC PANEL - Abnormal; Notable for the following components:   Sodium 126 (*)    Chloride 95 (*)    CO2 20 (*)    Glucose, Bld 162 (*)    Calcium 8.1 (*)    Albumin 3.2 (*)    All other components within normal limits  RESP PANEL BY RT-PCR (FLU A&B, COVID) ARPGX2  CULTURE, BLOOD (ROUTINE X 2)  CULTURE, BLOOD (ROUTINE X 2)  LACTIC ACID, PLASMA  LACTIC ACID, PLASMA  TROPONIN I (HIGH SENSITIVITY)  TROPONIN I (HIGH SENSITIVITY)   ____________________________________________  EKG  ED ECG REPORT I, Rudene Re, the attending physician, personally viewed and interpreted this ECG.  Sinus tachycardia, rate of 112, normal intervals, normal axis, no ST elevations or depressions ____________________________________________  RADIOLOGY  I have personally reviewed the images performed during this visit and I agree with the Radiologist's read.   Interpretation by Radiologist:  CT Angio Chest PE W and/or Wo Contrast  Result Date: 03/05/2021 CLINICAL DATA:  PE suspected, high prob Shortness of breath.  Recent knee replacement. EXAM: CT ANGIOGRAPHY CHEST WITH CONTRAST TECHNIQUE: Multidetector CT imaging of the chest was performed using the standard protocol during bolus administration of intravenous contrast. Multiplanar CT image reconstructions and MIPs were obtained to evaluate the vascular anatomy. CONTRAST:  53m OMNIPAQUE IOHEXOL 350  MG/ML SOLN COMPARISON:  Radiograph yesterday.  Chest CTA 11/01/2018 FINDINGS: Cardiovascular: There are no filling defects within the pulmonary arteries to suggest pulmonary embolus. Aortic atherosclerosis without dissection or acute aortic findings. Common origin of the brachiocephalic and left common carotid artery, variant arch anatomy. The heart is normal in size. There is no pericardial effusion. Occasional coronary artery calcifications. Mediastinum/Nodes: No enlarged mediastinal  or hilar lymph nodes. Patulous esophagus. Small hiatal hernia. No suspicious thyroid nodule. Lungs/Pleura: Bandlike atelectasis in the lingula and superior segment of the left lower lobe. Mild hypoventilatory change in the dependent lungs. No pneumonia or confluent airspace disease. No pleural fluid. No pulmonary edema. The trachea and central bronchi are patent. Upper Abdomen: Calcified granuloma in the liver, incidental. Stable 2.6 cm exophytic lesion from the posterior right kidney, likely complex cyst. Mixed density left adrenal nodule is stable in size, only partially included in the field of view. Musculoskeletal: Postsurgical change in the right glenoid. Mild thoracic spondylosis with endplate spurring. There are no acute or suspicious osseous abnormalities. Review of the MIP images confirms the above findings. IMPRESSION: 1. No pulmonary embolus. 2. Scattered linear atelectasis. No other acute intrathoracic abnormality. 3. Small hiatal hernia. Aortic Atherosclerosis (ICD10-I70.0). Electronically Signed   By: Keith Rake M.D.   On: 03/05/2021 01:05   DG Chest Port 1 View  Result Date: 03/04/2021 CLINICAL DATA:  Shortness of breath.  Recent knee replacement. EXAM: PORTABLE CHEST 1 VIEW COMPARISON:  Radiograph 11/14/2018 FINDINGS: The cardiomediastinal contours are normal. The lungs are clear. Pulmonary vasculature is normal. No consolidation, pleural effusion, or pneumothorax. No acute osseous abnormalities are seen.  IMPRESSION: No acute chest findings. Electronically Signed   By: Keith Rake M.D.   On: 03/04/2021 22:42     ____________________________________________   PROCEDURES  Procedure(s) performed:yes .1-3 Lead EKG Interpretation  Date/Time: 03/04/2021 11:31 PM Performed by: Rudene Re, MD Authorized by: Rudene Re, MD     Interpretation: non-specific     ECG rate assessment: tachycardic     Rhythm: sinus tachycardia     Ectopy: none     Conduction: normal    Critical Care performed:  None ____________________________________________   INITIAL IMPRESSION / ASSESSMENT AND PLAN / ED COURSE  65 y.o. female postop day 3 from a left total knee arthroplasty not on anticoagulations, melanoma of the left eye, GERD who presents for evaluation of shortness of breath and chills.  According to husband patient was febrile at home although here her temp is 99.89F, did not take anything at home for the fever. Initially hypoxic with sats between 92 and 93% although during my evaluation patient satting 98%, she is tachycardic with a pulse between 113 and 104 and normotensive.  Lungs are clear to auscultation.  EKG showing sinus tachycardia with no evidence of ischemia or dysrhythmias.  Patient reports that she feels completely back to normal at this time with no further complaints.  Differential diagnosis including PE versus pneumonia versus COVID versus flu versus anemia versus infection.  Patient has decent range of motion of the knee therefore low suspicion for septic joint at this time.  Will get chest x-ray, COVID and flu swab, labs, and if chest x-ray negative we will get a CT angio of the chest.  Patient last oxycodone was 4 hours ago.  Therefore will redose oxycodone and Tylenol for pain control.  Will give IV fluids for tachycardia.  Patient placed on telemetry for close monitoring of cardiorespiratory status.  Old medical records reviewed  _________________________ 2:13 AM on  03/05/2021 ----------------------------------------- CT with no signs of PE or pneumonia.  COVID and flu negative.  2 high-sensitivity troponins with no signs of ischemia.  Normal lactic acid.  White count is elevated with a mild anemia in the setting of recent surgery.  She also has mild hyponatremia with a sodium of 126 but no signs of confusion.  She did  receive a liter bolus for that and I recommended increasing amount of sodium slightly on diet and recheck with PCP in 2 days. Patient remains completely asymptomatic since arrival to the emergency room with no further episodes of shortness of breath or hypoxia.  Unclear etiology of patient's shortness of breath at home however with a normal evaluation here, patient remaining asymptomatic I think it is safe for her to be discharged home with close follow-up with her surgeon.  Recommended returning to the emergency room for any signs of confusion, shortness of breath, fever.  The plan and results were discussed with patient and her husband who was at bedside.    _____________________________________________ Please note:  Patient was evaluated in Emergency Department today for the symptoms described in the history of present illness. Patient was evaluated in the context of the global COVID-19 pandemic, which necessitated consideration that the patient might be at risk for infection with the SARS-CoV-2 virus that causes COVID-19. Institutional protocols and algorithms that pertain to the evaluation of patients at risk for COVID-19 are in a state of rapid change based on information released by regulatory bodies including the CDC and federal and state organizations. These policies and algorithms were followed during the patient's care in the ED.  Some ED evaluations and interventions may be delayed as a result of limited staffing during the pandemic.   [place] Controlled Substance Database was reviewed by  me. ____________________________________________   FINAL CLINICAL IMPRESSION(S) / ED DIAGNOSES   Final diagnoses:  Diarrhea, unspecified type  Shortness of breath  Hyponatremia      NEW MEDICATIONS STARTED DURING THIS VISIT:  ED Discharge Orders     None        Note:  This document was prepared using Dragon voice recognition software and may include unintentional dictation errors.    Rudene Re, MD 03/05/21 [id number]

## 2021-03-05 ENCOUNTER — Emergency Department: Payer: Medicare Other

## 2021-03-05 DIAGNOSIS — R0602 Shortness of breath: Secondary | ICD-10-CM | POA: Diagnosis not present

## 2021-03-05 LAB — COMPREHENSIVE METABOLIC PANEL
ALT: 18 U/L (ref 0–44)
AST: 23 U/L (ref 15–41)
Albumin: 3.2 g/dL — ABNORMAL LOW (ref 3.5–5.0)
Alkaline Phosphatase: 86 U/L (ref 38–126)
Anion gap: 11 (ref 5–15)
BUN: 21 mg/dL (ref 8–23)
CO2: 20 mmol/L — ABNORMAL LOW (ref 22–32)
Calcium: 8.1 mg/dL — ABNORMAL LOW (ref 8.9–10.3)
Chloride: 95 mmol/L — ABNORMAL LOW (ref 98–111)
Creatinine, Ser: 0.9 mg/dL (ref 0.44–1.00)
GFR, Estimated: >60 mL/min (ref >60)
Glucose, Bld: 162 mg/dL — ABNORMAL HIGH (ref 70–99)
Potassium: 4.2 mmol/L (ref 3.5–5.1)
Sodium: 126 mmol/L — ABNORMAL LOW (ref 135–145)
Total Bilirubin: 1.1 mg/dL (ref 0.3–1.2)
Total Protein: 6.9 g/dL (ref 6.5–8.1)

## 2021-03-05 LAB — TROPONIN I (HIGH SENSITIVITY)
Troponin I (High Sensitivity): 10 ng/L (ref <18)
Troponin I (High Sensitivity): 8 ng/L (ref <18)

## 2021-03-05 LAB — LACTIC ACID, PLASMA: Lactic Acid, Venous: 1.4 mmol/L (ref 0.5–1.9)

## 2021-03-05 MED ORDER — FENTANYL CITRATE (PF) 100 MCG/2ML IJ SOLN
50.0000 ug | Freq: Once | INTRAMUSCULAR | Status: AC
Start: 2021-03-05 — End: 2021-03-05
  Administered 2021-03-05: 50 ug via INTRAVENOUS
  Filled 2021-03-05: qty 2

## 2021-03-05 MED ORDER — IOHEXOL 350 MG/ML SOLN
75.0000 mL | Freq: Once | INTRAVENOUS | Status: AC | PRN
  Administered 2021-03-05: 75 mL via INTRAVENOUS

## 2021-03-05 NOTE — ED Notes (Signed)
When ambulating this patient to restroom the patients Spo2 saturation remained at 96% on room air

## 2021-03-09 LAB — CULTURE, BLOOD (ROUTINE X 2)
Culture: NO GROWTH
Culture: NO GROWTH
Special Requests: ADEQUATE

## 2021-08-30 ENCOUNTER — Other Ambulatory Visit: Payer: Self-pay | Admitting: Family Medicine

## 2021-08-30 ENCOUNTER — Ambulatory Visit
Admission: RE | Admit: 2021-08-30 | Discharge: 2021-08-30 | Disposition: A | Discharge status: Home / Self Care | Payer: Medicare Other | Source: Ambulatory Visit | Attending: Family Medicine | Admitting: Family Medicine

## 2021-08-30 DIAGNOSIS — R058 Other specified cough: Secondary | ICD-10-CM

## 2021-08-30 DIAGNOSIS — R490 Dysphonia: Secondary | ICD-10-CM

## 2021-08-30 DIAGNOSIS — R0602 Shortness of breath: Secondary | ICD-10-CM | POA: Insufficient documentation

## 2021-08-31 ENCOUNTER — Ambulatory Visit: Payer: Medicare Other

## 2021-09-27 ENCOUNTER — Encounter (HOSPITAL_COMMUNITY): Payer: Self-pay | Admitting: Radiology

## 2021-12-25 ENCOUNTER — Other Ambulatory Visit: Payer: Self-pay | Admitting: Internal Medicine

## 2021-12-25 DIAGNOSIS — Z1231 Encounter for screening mammogram for malignant neoplasm of breast: Secondary | ICD-10-CM

## 2023-06-10 ENCOUNTER — Ambulatory Visit
Admission: RE | Admit: 2023-06-10 | Discharge: 2023-06-10 | Disposition: A | Discharge status: Home / Self Care | Payer: Medicare Other | Source: Ambulatory Visit | Attending: Internal Medicine | Admitting: Internal Medicine

## 2023-06-10 ENCOUNTER — Other Ambulatory Visit: Payer: Self-pay | Admitting: Internal Medicine

## 2023-06-10 DIAGNOSIS — R519 Headache, unspecified: Secondary | ICD-10-CM | POA: Insufficient documentation

## 2023-09-06 ENCOUNTER — Emergency Department: Payer: Medicare Other

## 2023-09-06 ENCOUNTER — Emergency Department
Admission: EM | Admit: 2023-09-06 | Discharge: 2023-09-06 | Disposition: A | Discharge status: Home / Self Care | Payer: Medicare Other | Attending: Emergency Medicine | Admitting: Emergency Medicine

## 2023-09-06 ENCOUNTER — Other Ambulatory Visit: Payer: Self-pay

## 2023-09-06 DIAGNOSIS — Z85841 Personal history of malignant neoplasm of brain: Secondary | ICD-10-CM | POA: Diagnosis not present

## 2023-09-06 DIAGNOSIS — R519 Headache, unspecified: Secondary | ICD-10-CM | POA: Diagnosis not present

## 2023-09-06 DIAGNOSIS — M542 Cervicalgia: Secondary | ICD-10-CM | POA: Insufficient documentation

## 2023-09-06 DIAGNOSIS — Y92009 Unspecified place in unspecified non-institutional (private) residence as the place of occurrence of the external cause: Secondary | ICD-10-CM | POA: Insufficient documentation

## 2023-09-06 DIAGNOSIS — M25551 Pain in right hip: Secondary | ICD-10-CM | POA: Insufficient documentation

## 2023-09-06 DIAGNOSIS — M25511 Pain in right shoulder: Secondary | ICD-10-CM | POA: Insufficient documentation

## 2023-09-06 DIAGNOSIS — W19XXXA Unspecified fall, initial encounter: Secondary | ICD-10-CM | POA: Insufficient documentation

## 2023-09-06 MED ORDER — LIDOCAINE 5 % EX PTCH
1.0000 | MEDICATED_PATCH | CUTANEOUS | Status: DC
  Administered 2023-09-06: 1 via TRANSDERMAL
  Filled 2023-09-06: qty 1

## 2023-09-06 MED ORDER — OXYCODONE HCL 5 MG PO TABS
5.0000 mg | ORAL_TABLET | Freq: Once | ORAL | Status: AC
  Administered 2023-09-06: 5 mg via ORAL
  Filled 2023-09-06: qty 1

## 2023-09-06 MED ORDER — MORPHINE SULFATE ER 15 MG PO TBCR
30.0000 mg | EXTENDED_RELEASE_TABLET | Freq: Once | ORAL | Status: AC
  Administered 2023-09-06: 30 mg via ORAL
  Filled 2023-09-06: qty 2

## 2023-09-06 MED ORDER — HYDROMORPHONE HCL 1 MG/ML IJ SOLN
1.0000 mg | Freq: Once | INTRAMUSCULAR | Status: AC
  Administered 2023-09-06: 1 mg via INTRAVENOUS
  Filled 2023-09-06: qty 1

## 2023-09-06 MED ORDER — ACETAMINOPHEN 500 MG PO TABS
1000.0000 mg | ORAL_TABLET | Freq: Once | ORAL | Status: AC
  Administered 2023-09-06: 1000 mg via ORAL
  Filled 2023-09-06: qty 2

## 2023-09-06 NOTE — ED Provider Notes (Signed)
[facility] Provider Note    Event Date/Time   First MD Initiated Contact with Patient 09/06/23 1102     (approximate)   History   Fall   HPI  Cindy Morrison is a 68 y.o. female who presents to the ED for evaluation of Fall   I review a PCP visit from December, oncology visit from last month..  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  Chronic opiates.  Patient presents to the ED with her husband after an accidental fall and concern for injury.  Patient was walking their dog when she bent over to pick up some poop and reports tumbling forward over her right shoulder causing injury to her right side.  Reports right-sided shoulder head and neck pain.  Right-sided hip pain.   Physical Exam   Triage Vital Signs: ED Triage Vitals  Encounter Vitals Group     BP 09/06/23 0938 124/74     Systolic BP Percentile --      Diastolic BP Percentile --      Pulse Rate 09/06/23 0938 89     Resp 09/06/23 0938 18     Temp 09/06/23 0938 98.1 F (36.7 C)     Temp Source 09/06/23 0938 Oral     SpO2 09/06/23 0938 96 %     Weight --      Height --      Head Circumference --      Peak Flow --      Pain Score 09/06/23 0951 9     Pain Loc --      Pain Education --      Exclude from Growth Chart --     Most recent vital signs: Vitals:   09/06/23 1101 09/06/23 1101  BP:  102/87  Pulse:    Resp:    Temp:    SpO2: 99%     General: Awake, no distress.  CV:  Good peripheral perfusion.  Resp:  Normal effort.  Abd:  No distention.  MSK:  No deformity noted.  Neuro:  No focal deficits appreciated. Other:     ED Results / Procedures / Treatments   Labs (all labs ordered are listed, but only abnormal results are displayed) Labs Reviewed - No data to display  EKG   RADIOLOGY CT head interpreted by me without evidence of acute intracranial pathology CT cervical spine interpreted by me without evidence of  fracture or dislocation Plain film of the right shoulder interpreted by me without evidence of fracture or dislocation Plain film of the pelvis and right hip interpreted by me without evidence of fracture or dislocation  Official radiology report(s): CT Thoracic Spine Wo Contrast Result Date: 09/06/2023 CLINICAL DATA:  Back pain after fall EXAM: CT THORACIC SPINE WITHOUT CONTRAST TECHNIQUE: Multidetector CT images of the thoracic were obtained using the standard protocol without intravenous contrast. RADIATION DOSE REDUCTION: This exam was performed according to the departmental dose-optimization program which includes automated exposure control, adjustment of the mA and/or kV according to patient size and/or use of iterative reconstruction technique. COMPARISON:  CT 08/30/2021, 03/05/2021, 11/01/2018 FINDINGS: Alignment: Normal. Vertebrae: Vertebral body heights are maintained without evidence of fracture. There are multiple scattered rounded areas of sclerosis throughout the vertebral bodies of the thoracic spine, new since the 2023 CT. Possible nondepressed pathologic fracture at the superior endplate of T5 (series 4, image 32). 6 mm lucent lesion within the inferior aspect of the  T1 vertebral body (series 4, image 33), also new. No additional lytic bone lesions are identified. No expansile bone lesion. Paraspinal and other soft tissues: Rounded exophytic lesion arising from the posterior aspect of the upper pole of the right kidney is stable compared to the 2020 CT and now measures slightly greater than simple fluid density most compatible with a benign cyst complicated by proteinaceous or hemorrhagic content. Stable 3.7 cm left adrenal mass containing macroscopic fat. There is a new nodular mass located between the left adrenal gland and left kidney measuring 2.5 x 1.8 cm of indeterminate origin (series 3, image 138). Small hiatal hernia. Disc levels: Mild thoracic spondylosis. IMPRESSION: 1. Multiple  scattered rounded areas of sclerosis throughout the vertebral bodies of the thoracic spine, new since the 2023 CT. Additional small lytic lesion within the T1 vertebrae. Findings are most concerning for metastatic disease. 2. Possible nondepressed pathologic fracture at the superior endplate of T5. 3. New nodular mass located between the left adrenal gland and left kidney measuring 2.5 x 1.8 cm of indeterminate origin. Follow-up nonemergent renal protocol CT or MRI with contrast is recommended. 4. Stable 3.7 cm left adrenal mass containing macroscopic fat. 5. Small hiatal hernia. Electronically Signed   By: Duanne Guess D.O.   On: 09/06/2023 13:39   CT HEAD WO CONTRAST ( ) Result Date: 09/06/2023 CLINICAL DATA:  History metastatic ocular melanoma. Acute fall with head injury. EXAM: CT HEAD WITHOUT CONTRAST CT CERVICAL SPINE WITHOUT CONTRAST TECHNIQUE: Multidetector CT imaging of the head and cervical spine was performed following the standard protocol without intravenous contrast. Multiplanar CT image reconstructions of the cervical spine were also generated. RADIATION DOSE REDUCTION: This exam was performed according to the departmental dose-optimization program which includes automated exposure control, adjustment of the mA and/or kV according to patient size and/or use of iterative reconstruction technique. COMPARISON:  Brain MRI from 06/10/2023. FINDINGS: CT HEAD FINDINGS Brain: No evidence of acute infarction, hemorrhage, hydrocephalus, or extra-axial fluid collection collection. Numerous brain metastases are identified as noted previously. These appear similar in multiplicity and distribution compared with the prior examination. -index lesion in the anterior right temporal lobe measures 1.6 cm with surrounding vasogenic edema, image 9/2. Previously this measured the same. Index lesion within the left temporal lobe measures 1.2 cm, image 14/2. Previously this measured the same. No significant mass  effect or midline shift. Ventricular volumes appear within normal limits. Vascular: No hyperdense vessel or unexpected calcification. Skull: Status post left occipital craniotomy. No acute abnormality. Sinuses/Orbits: No acute finding. Other: Right frontoparietal scalp laceration containing subcutaneous gas with adjacent 1.6 cm scalp hematoma, image 25/2. No underlying skull fracture. CT CERVICAL SPINE FINDINGS Alignment: Normal. Skull base and vertebrae: Well-circumscribed lucent lesion with narrow zone of transition within the odontoid process measures 0.7 cm and is favored to represent a benign cyst. No signs of acute fracture. No aggressive bone lesions identified. Soft tissues and spinal canal: No prevertebral fluid or swelling. No visible canal hematoma. Disc levels: Multilevel disc space narrowing and endplate spurring is noted most severe at the 2 C5-6 level. Bilateral facet arthropathy. Upper chest: Negative. Other: None IMPRESSION: 1. No acute intracranial abnormality. 2. Right frontoparietal scalp laceration containing subcutaneous gas with adjacent 1.6 cm scalp hematoma. No underlying skull fracture. 3. Numerous brain metastases are identified as noted previously. These appear similar in multiplicity and distribution compared with the prior examination. 4. No evidence for cervical spine fracture or subluxation. 5. Cervical degenerative disc disease and facet arthropathy. Electronically Signed  By: Signa Kell M.D.   On: 09/06/2023 13:38   CT Cervical Spine Wo Contrast Result Date: 09/06/2023 CLINICAL DATA:  History metastatic ocular melanoma. Acute fall with head injury. EXAM: CT HEAD WITHOUT CONTRAST CT CERVICAL SPINE WITHOUT CONTRAST TECHNIQUE: Multidetector CT imaging of the head and cervical spine was performed following the standard protocol without intravenous contrast. Multiplanar CT image reconstructions of the cervical spine were also generated. RADIATION DOSE REDUCTION: This exam was  performed according to the departmental dose-optimization program which includes automated exposure control, adjustment of the mA and/or kV according to patient size and/or use of iterative reconstruction technique. COMPARISON:  Brain MRI from 06/10/2023. FINDINGS: CT HEAD FINDINGS Brain: No evidence of acute infarction, hemorrhage, hydrocephalus, or extra-axial fluid collection collection. Numerous brain metastases are identified as noted previously. These appear similar in multiplicity and distribution compared with the prior examination. -index lesion in the anterior right temporal lobe measures 1.6 cm with surrounding vasogenic edema, image 9/2. Previously this measured the same. Index lesion within the left temporal lobe measures 1.2 cm, image 14/2. Previously this measured the same. No significant mass effect or midline shift. Ventricular volumes appear within normal limits. Vascular: No hyperdense vessel or unexpected calcification. Skull: Status post left occipital craniotomy. No acute abnormality. Sinuses/Orbits: No acute finding. Other: Right frontoparietal scalp laceration containing subcutaneous gas with adjacent 1.6 cm scalp hematoma, image 25/2. No underlying skull fracture. CT CERVICAL SPINE FINDINGS Alignment: Normal. Skull base and vertebrae: Well-circumscribed lucent lesion with narrow zone of transition within the odontoid process measures 0.7 cm and is favored to represent a benign cyst. No signs of acute fracture. No aggressive bone lesions identified. Soft tissues and spinal canal: No prevertebral fluid or swelling. No visible canal hematoma. Disc levels: Multilevel disc space narrowing and endplate spurring is noted most severe at the 2 C5-6 level. Bilateral facet arthropathy. Upper chest: Negative. Other: None IMPRESSION: 1. No acute intracranial abnormality. 2. Right frontoparietal scalp laceration containing subcutaneous gas with adjacent 1.6 cm scalp hematoma. No underlying skull fracture.  3. Numerous brain metastases are identified as noted previously. These appear similar in multiplicity and distribution compared with the prior examination. 4. No evidence for cervical spine fracture or subluxation. 5. Cervical degenerative disc disease and facet arthropathy. Electronically Signed   By: Signa Kell M.D.   On: 09/06/2023 13:38   DG Shoulder Right Result Date: 09/06/2023 CLINICAL DATA:  Fall with shoulder pain EXAM: RIGHT SHOULDER - 2+ VIEW COMPARISON:  None Available. FINDINGS: No acute fracture or dislocation. Degenerative changes of the glenohumeral joint and rotator cuff insertion. Visualized portion of the right hemithorax is normal. Suspect remote distal right clavicular fracture. IMPRESSION: Degenerative change, without acute osseous finding. Electronically Signed   By: Jeronimo Greaves M.D.   On: 09/06/2023 10:47   DG Hip Unilat W or Wo Pelvis 2-3 Views Right Result Date: 09/06/2023 CLINICAL DATA:  Right hip pain after a fall EXAM: DG HIP (WITH OR WITHOUT PELVIS) 2-3V RIGHT COMPARISON:  None Available. FINDINGS: AP view of the pelvis and AP/frog leg views of the right hip. No acute fracture or dislocation. Sacroiliac joints are symmetric. Joint spaces maintained. IMPRESSION: No acute osseous abnormality. Electronically Signed   By: Jeronimo Greaves M.D.   On: 09/06/2023 10:46    PROCEDURES and INTERVENTIONS:  Procedures  Medications  lidocaine (LIDODERM) 5 % 1 patch (1 patch Transdermal Patch Applied 09/06/23 1355)  acetaminophen (TYLENOL) tablet 1,000 mg (1,000 mg Oral Given 09/06/23 1114)  oxyCODONE (Oxy IR/ROXICODONE)  immediate release tablet 5 mg (5 mg Oral Given 09/06/23 1114)  morphine (MS CONTIN) 12 hr tablet 30 mg (30 mg Oral Given 09/06/23 1153)  HYDROmorphone (DILAUDID) injection 1 mg (1 mg Intravenous Given 09/06/23 1156)     IMPRESSION / MDM / ASSESSMENT AND PLAN / ED COURSE  I reviewed the triage vital signs and the nursing notes.  Differential diagnosis includes, but is  not limited to, humerus fracture or dislocation, hip fracture, ICH, pathological spine fracture  {Patient presents with symptoms of an acute illness or injury that is potentially life-threatening.  Patient presents after a fall with acute pain but no evidence of significant acute injury, suitable for outpatient management.  Imaging, as above.  No clear pathological fracture to her known cervical mets.  Pain is controlled and she is suitable for outpatient management.  Clinical Course as of 09/06/23 1514  Sat Sep 06, 2023  1350 Reassessed and discussed CT results.  She is appreciative and reports feeling much better after the analgesia, reports "I even had a nap." [DS]  1351 She and her husband are eager to leave as husband has an outpatient MRI in about 30 minutes nearby and they want to make this appointment.,  We discussed reassuring workup overall without signs of traumatic injury, expectant management and ED return precautions. [DS]    Clinical Course User Index [DS] Delton Prairie, MD     FINAL CLINICAL IMPRESSION(S) / ED DIAGNOSES   Final diagnoses:  Fall, initial encounter     Rx / DC Orders   ED Discharge Orders     None        Note:  This document was prepared using Dragon voice recognition software and may include unintentional dictation errors.   Delton Prairie, MD 09/06/23 [phone]

## 2023-09-06 NOTE — ED Triage Notes (Addendum)
Pt to Ed via [facility] from home. Pt reports was walking her dog and was picking up trash and fell on right side. Pt reports right shoulder pain and right hip pain. Pulse present and strong in right radius. Pt was able to bear weight after fall

## 2023-09-06 NOTE — ED Notes (Signed)
 Pt back from CT

## 2023-09-06 NOTE — ED Notes (Signed)
First Nurse Note: Pt to ED via [facility] from home for fall. Pt went to check the mail and fell. Pt is c/o right shoulder and hip pain. Pt ambulatory on scene and has full ROM. Pt has some abrasions and bruising on her right hip. EMS applied sling for comfort.

## 2023-09-17 ENCOUNTER — Emergency Department
Admission: EM | Admit: 2023-09-17 | Discharge: 2023-09-17 | Disposition: A | Discharge status: Home / Self Care | Payer: Medicare Other | Attending: Emergency Medicine | Admitting: Emergency Medicine

## 2023-09-17 ENCOUNTER — Other Ambulatory Visit: Payer: Self-pay

## 2023-09-17 DIAGNOSIS — T402X1A Poisoning by other opioids, accidental (unintentional), initial encounter: Secondary | ICD-10-CM | POA: Insufficient documentation

## 2023-09-17 DIAGNOSIS — R7309 Other abnormal glucose: Secondary | ICD-10-CM | POA: Insufficient documentation

## 2023-09-17 DIAGNOSIS — T50901A Poisoning by unspecified drugs, medicaments and biological substances, accidental (unintentional), initial encounter: Secondary | ICD-10-CM

## 2023-09-17 LAB — CBG MONITORING, ED: Glucose-Capillary: 113 mg/dL — ABNORMAL HIGH (ref 70–99)

## 2023-09-17 MED ORDER — NALOXONE HCL 2 MG/2ML IJ SOSY
0.4000 mg | PREFILLED_SYRINGE | Freq: Once | INTRAMUSCULAR | Status: AC
  Administered 2023-09-17: 0.4 mg via INTRAVENOUS
  Filled 2023-09-17: qty 2

## 2023-09-17 MED ORDER — NALOXONE HCL 4 MG/0.1ML NA LIQD: NASAL | 1 refills | Status: DC

## 2023-09-17 NOTE — ED Notes (Signed)
ED Provider at bedside.

## 2023-09-17 NOTE — ED Notes (Addendum)
Recommendation per poison control: Observation 4-6 hours after IV narcan and seven hours for any other route.

## 2023-09-17 NOTE — ED Triage Notes (Signed)
Pt comes with c/o accidental overdose of her pain meds. Pt is prescribed morphine at home for brain cancer. Pt is suppose to take 30mg  plus additional 15mg  twice a day. Pt took this at 0605 and then again at 743  Pt states she just feels sleepy. Pt speaking in complete sentences. Pt was sent here by her pcp.

## 2023-09-17 NOTE — ED Provider Notes (Signed)
[facility] Provider Note    Event Date/Time   First MD Initiated Contact with Patient 09/17/23 [phone]     (approximate)   History   Drug Overdose   HPI  Cindy Morrison is a 67 y.o. female past medical history significant for brain cancer presents to the emergency department after an accidental overdose on her home morphine.  States that she mistakenly took her morphine at 6:00 in the morning and then again at 7:00.  States that as soon as she realized that she had took an extra dose she called her oncologist who told her to come into the emergency department.  States that she is feeling very tired.  Endorses an episode of nausea and vomiting yesterday.  Otherwise denies any new symptoms.     Physical Exam   Triage Vital Signs: ED Triage Vitals  Encounter Vitals Group     BP 09/17/23 0928 (!) 145/96     Systolic BP Percentile --      Diastolic BP Percentile --      Pulse Rate 09/17/23 0928 94     Resp --      Temp 09/17/23 0928 98.1 F (36.7 C)     Temp Source 09/17/23 0928 Oral     SpO2 09/17/23 0928 95 %     Weight --      Height --      Head Circumference --      Peak Flow --      Pain Score 09/17/23 0933 0     Pain Loc --      Pain Education --      Exclude from Growth Chart --     Most recent vital signs: Vitals:   09/17/23 0928 09/17/23 1017  BP: (!) 145/96 128/82  Pulse: 94 76  Temp: 98.1 F (36.7 C)   SpO2: 95% 96%    Physical Exam Constitutional:      Appearance: She is well-developed.     Comments: Somnolent, easily arousable but falling asleep during the interview  HENT:     Head: Atraumatic.  Eyes:     Conjunctiva/sclera: Conjunctivae normal.  Cardiovascular:     Rate and Rhythm: Regular rhythm.  Pulmonary:     Effort: No respiratory distress.     Comments: Decreased respiratory rate Abdominal:     General: There is no distension.  Musculoskeletal:        General: Normal range of motion.     Cervical back: Normal  range of motion.  Skin:    General: Skin is warm.  Neurological:     Mental Status: She is alert. Mental status is at baseline.     IMPRESSION / MDM / ASSESSMENT AND PLAN / ED COURSE  I reviewed the triage vital signs and the nursing notes.  Differential diagnosis including accidental overdose by morphine, hypoglycemia   LABS (all labs ordered are listed, but only abnormal results are displayed) Labs interpreted as -    Labs Reviewed  CBG MONITORING, ED - Abnormal; Notable for the following components:      Result Value   Glucose-Capillary 113 (*)    All other components within normal limits     MDM  Patient was placed on end-tidal CO2.  Initially was somnolent but easily arrived supple and protecting her airway.  However on CO2 patient had a decrease of her respiratory rate down to 9 and elevation of her end-tidal CO2.  Patient was given a dose of  IV Narcan.  Patient had improvement of her symptoms.  Tolerating p.o.  She was monitored in the emergency department for any recurrence of opioid overdose.  Will give a prescription for Narcan.  Stable for discharge with follow-up with oncology.  Given return precautions. Clinical Course as of 09/17/23 1242  Wed Sep 17, 2023  1241 Discussed the patient's case with poison control who recommended a 4 to 6-hour observation for possible redosing of Narcan.  Given a prescription for Narcan. [SM]    Clinical Course User Index [SM] Corena Herter, MD     PROCEDURES:  Critical Care performed: No  Procedures  Patient's presentation is most consistent with acute complicated illness / injury requiring diagnostic workup.   MEDICATIONS ORDERED IN ED: Medications  naloxone (NARCAN) injection 0.4 mg (0.4 mg Intravenous Given 09/17/23 1034)    FINAL CLINICAL IMPRESSION(S) / ED DIAGNOSES   Final diagnoses:  Accidental overdose, initial encounter     Rx / DC Orders   ED Discharge Orders          Ordered    naloxone (NARCAN)  nasal spray 4 mg/0.1 mL        09/17/23 1213             Note:  This document was prepared using Dragon voice recognition software and may include unintentional dictation errors.   Corena Herter, MD 09/17/23 1215

## 2023-09-17 NOTE — ED Notes (Signed)
Patient A&O x4. Patient drowsy and will fall asleep during conversation

## 2023-11-06 ENCOUNTER — Emergency Department

## 2023-11-06 ENCOUNTER — Other Ambulatory Visit: Payer: Self-pay

## 2023-11-06 DIAGNOSIS — G8929 Other chronic pain: Secondary | ICD-10-CM | POA: Insufficient documentation

## 2023-11-06 DIAGNOSIS — Z8505 Personal history of malignant neoplasm of liver: Secondary | ICD-10-CM | POA: Diagnosis not present

## 2023-11-06 DIAGNOSIS — Z85840 Personal history of malignant neoplasm of eye: Secondary | ICD-10-CM | POA: Diagnosis not present

## 2023-11-06 DIAGNOSIS — M25551 Pain in right hip: Secondary | ICD-10-CM | POA: Insufficient documentation

## 2023-11-06 MED ORDER — OXYCODONE HCL 5 MG PO TABS
10.0000 mg | ORAL_TABLET | Freq: Once | ORAL | Status: AC
  Administered 2023-11-06: 10 mg via ORAL
  Filled 2023-11-06: qty 2

## 2023-11-06 MED ORDER — MORPHINE SULFATE ER 15 MG PO TBCR
30.0000 mg | EXTENDED_RELEASE_TABLET | Freq: Once | ORAL | Status: AC
  Administered 2023-11-06: 30 mg via ORAL
  Filled 2023-11-06: qty 2

## 2023-11-06 NOTE — ED Triage Notes (Signed)
 Pt arrived POV for right hip pain since last night while laying in bed, denies any injuries, thinks she may have turned wrong in bed. Her oncologist advised to come to ED to have an xray or CT scan to rule out any injuries or mass/tumor, pt has hx of malignant neoplasm metastatic to liver. Husband wanted her to have another Cortizone injection to help with pain. Pt has taken her 10 mg of oxy around 8 pm tonight. Pt reports her pain is intermittent. VSS, afebrile. A&O x4.

## 2023-11-07 ENCOUNTER — Emergency Department
Admission: EM | Admit: 2023-11-07 | Discharge: 2023-11-07 | Disposition: A | Discharge status: Home / Self Care | Attending: Emergency Medicine | Admitting: Emergency Medicine

## 2023-11-07 DIAGNOSIS — G8929 Other chronic pain: Secondary | ICD-10-CM

## 2023-11-07 NOTE — ED Provider Notes (Signed)
 [facility] Provider Note   Event Date/Time   First MD Initiated Contact with Patient 11/07/23 0123     (approximate) History  Right Hip Pain  HPI Cindy Morrison is a 68 y.o. 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  Patient was sent by her oncologist with concern for possible fracture/lytic lesion on this right side.  Husband is requesting cortisone injection.  Patient has taken an increase in her oxycodone from 5 to 10 mg tonight and reports her pain as intermittent. ROS: Patient currently denies any vision changes, tinnitus, difficulty speaking, facial droop, sore throat, chest pain, shortness of breath, abdominal pain, nausea/vomiting/diarrhea, dysuria, or weakness/numbness/paresthesias in any extremity   Physical Exam  Triage Vital Signs: ED Triage Vitals  Encounter Vitals Group     BP 11/06/23 2150 136/81     Systolic BP Percentile --      Diastolic BP Percentile --      Pulse Rate 11/06/23 2150 89     Resp 11/06/23 2150 17     Temp 11/06/23 2150 98.4 F (36.9 C)     Temp Source 11/06/23 2150 Oral     SpO2 11/06/23 2150 96 %     Weight 11/06/23 2152 161 lb (73 kg)     Height 11/06/23 2152 5\' 6"  (1.676 m)     Head Circumference --      Peak Flow --      Pain Score 11/06/23 2152 9     Pain Loc --      Pain Education --      Exclude from Growth Chart --    Most recent vital signs: Vitals:   11/07/23 0137 11/07/23 0300  BP: 133/87 138/77  Pulse: 88 79  Resp: 18 18  Temp:    SpO2: 96% 94%   General: Awake, oriented x4. CV:  Good peripheral perfusion.  Resp:  Normal effort.  Abd:  No distention.  Other:  Elderly overweight Caucasian female resting comfortably in no acute distress.  Tenderness to palpation over right lateral hip ED Results / Procedures / Treatments  Labs (all labs ordered are listed, but only abnormal  results are displayed) Labs Reviewed - No data to display RADIOLOGY ED MD interpretation: X-ray of the right hip independently interpreted and shows no evidence of acute abnormalities -Agree with radiology assessment Official radiology report(s): DG Hip Unilat With Pelvis 2-3 Views Right Result Date: 11/06/2023 CLINICAL DATA:  Right hip pain EXAM: DG HIP (WITH OR WITHOUT PELVIS) 2-3V RIGHT COMPARISON:  None Available. FINDINGS: There is no evidence of hip fracture or dislocation. There is no evidence of arthropathy or other focal bone abnormality. IMPRESSION: Negative. Electronically Signed   By: Helyn Numbers M.D.   On: 11/06/2023 22:28   PROCEDURES: Critical Care performed: No Procedures MEDICATIONS ORDERED IN ED: Medications  oxyCODONE (Oxy IR/ROXICODONE) immediate release tablet 10 mg (10 mg Oral Given 11/06/23 2201)  morphine (MS CONTIN) 12 hr tablet 30 mg (30 mg Oral Given 11/06/23 2309)   IMPRESSION / MDM / ASSESSMENT AND PLAN / ED COURSE  I reviewed the triage vital signs and the nursing notes.                             The patient is on the cardiac monitor to evaluate for evidence  of arrhythmia and/or significant heart rate changes. Patient's presentation is most consistent with acute presentation with potential threat to life or bodily function. 68 year old female presents complaining of acute on chronic right hip pain Given history, exam and workup I have low suspicion for fracture, dislocation, significant ligamentous injury, septic arthritis, gout flare, new autoimmune arthropathy, or gonococcal arthropathy.  Interventions: X-ray of the right hip shows no evidence of fracture or lytic lesions Patient was encouraged to increase her oxycodone dose as needed for any continued pain and follow-up with orthopedics for cortisone injection as needed Disposition: Discharge home with strict return precautions and instructions for prompt primary care follow up in the next week.   FINAL  CLINICAL IMPRESSION(S) / ED DIAGNOSES   Final diagnoses:  Chronic right hip pain   Rx / DC Orders   ED Discharge Orders     None      Note:  This document was prepared using Dragon voice recognition software and may include unintentional dictation errors.   Merwyn Katos, MD 11/07/23 [phone]

## 2023-11-07 NOTE — Discharge Instructions (Addendum)
 Please increase your oxycodone to 10 mg every 4 hours until follow-up with orthopedics

## 2024-02-03 DEATH — deceased
